# Patient Record
Sex: Male | Born: 1985 | Race: White | Hispanic: No | Marital: Single | Smoking: Current every day smoker
Health system: Southern US, Community
[De-identification: ages and names within clinical notes are randomized; demographics above are authoritative.]

## PROBLEM LIST (undated history)

## (undated) DIAGNOSIS — F32A Depression, unspecified: Secondary | ICD-10-CM

## (undated) DIAGNOSIS — F1010 Alcohol abuse, uncomplicated: Secondary | ICD-10-CM

## (undated) DIAGNOSIS — F419 Anxiety disorder, unspecified: Secondary | ICD-10-CM

## (undated) DIAGNOSIS — F329 Major depressive disorder, single episode, unspecified: Secondary | ICD-10-CM

## (undated) DIAGNOSIS — A549 Gonococcal infection, unspecified: Secondary | ICD-10-CM

## (undated) DIAGNOSIS — K219 Gastro-esophageal reflux disease without esophagitis: Secondary | ICD-10-CM

---
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
## 2006-05-01 ENCOUNTER — Emergency Department (HOSPITAL_COMMUNITY): Admission: EM | Admit: 2006-05-01 | Discharge: 2006-05-01 | Payer: Self-pay | Admitting: Emergency Medicine

## 2006-07-24 ENCOUNTER — Emergency Department (HOSPITAL_COMMUNITY): Admission: EM | Admit: 2006-07-24 | Discharge: 2006-07-24 | Payer: Self-pay | Admitting: Emergency Medicine

## 2010-02-08 ENCOUNTER — Emergency Department (HOSPITAL_COMMUNITY)
Admission: EM | Admit: 2010-02-08 | Discharge: 2010-02-08 | Payer: Self-pay | Source: Home / Self Care | Admitting: Family Medicine

## 2010-05-18 ENCOUNTER — Emergency Department (HOSPITAL_COMMUNITY): Admission: EM | Admit: 2010-05-18 | Discharge: 2010-05-18 | Payer: Self-pay | Admitting: Emergency Medicine

## 2010-05-20 ENCOUNTER — Emergency Department (HOSPITAL_COMMUNITY): Admission: EM | Admit: 2010-05-20 | Discharge: 2010-05-20 | Payer: Self-pay | Admitting: Emergency Medicine

## 2010-10-05 ENCOUNTER — Emergency Department (HOSPITAL_COMMUNITY): Admission: EM | Admit: 2010-10-05 | Discharge: 2010-10-05 | Payer: Self-pay | Admitting: Emergency Medicine

## 2010-10-08 ENCOUNTER — Emergency Department (HOSPITAL_COMMUNITY): Admission: EM | Admit: 2010-10-08 | Discharge: 2010-10-08 | Payer: Self-pay | Admitting: Emergency Medicine

## 2010-10-08 ENCOUNTER — Emergency Department (HOSPITAL_COMMUNITY)
Admission: EM | Admit: 2010-10-08 | Discharge: 2010-10-08 | Payer: Self-pay | Source: Home / Self Care | Admitting: Family Medicine

## 2010-10-12 ENCOUNTER — Emergency Department (HOSPITAL_COMMUNITY): Admission: EM | Admit: 2010-10-12 | Discharge: 2010-10-12 | Payer: Self-pay | Admitting: Emergency Medicine

## 2011-02-23 LAB — POCT I-STAT, CHEM 8
BUN: 9 mg/dL (ref 6–23)
Calcium, Ion: 1.13 mmol/L (ref 1.12–1.32)
HCT: 52 % (ref 39.0–52.0)
Hemoglobin: 17.7 g/dL — ABNORMAL HIGH (ref 13.0–17.0)
Sodium: 137 mEq/L (ref 135–145)
TCO2: 26 mmol/L (ref 0–100)

## 2011-02-28 LAB — POCT URINALYSIS DIP (DEVICE)
Bilirubin Urine: NEGATIVE
Glucose, UA: NEGATIVE mg/dL
Hgb urine dipstick: NEGATIVE
Specific Gravity, Urine: 1.025 (ref 1.005–1.030)
pH: 6.5 (ref 5.0–8.0)

## 2011-02-28 LAB — COMPREHENSIVE METABOLIC PANEL
ALT: 20 U/L (ref 0–53)
Albumin: 5.4 g/dL — ABNORMAL HIGH (ref 3.5–5.2)
Alkaline Phosphatase: 40 U/L (ref 39–117)
Calcium: 9.7 mg/dL (ref 8.4–10.5)
Glucose, Bld: 197 mg/dL — ABNORMAL HIGH (ref 70–99)
Potassium: 3.8 mEq/L (ref 3.5–5.1)
Sodium: 138 mEq/L (ref 135–145)
Total Protein: 7.7 g/dL (ref 6.0–8.3)

## 2011-02-28 LAB — DIFFERENTIAL
Basophils Relative: 0 % (ref 0–1)
Lymphs Abs: 2.3 10*3/uL (ref 0.7–4.0)
Monocytes Absolute: 0.6 10*3/uL (ref 0.1–1.0)
Monocytes Relative: 7 % (ref 3–12)
Neutro Abs: 5.6 10*3/uL (ref 1.7–7.7)
Neutrophils Relative %: 66 % (ref 43–77)

## 2011-02-28 LAB — CBC
HCT: 44.1 % (ref 39.0–52.0)
Platelets: 225 10*3/uL (ref 150–400)
RDW: 12.1 % (ref 11.5–15.5)
WBC: 8.5 10*3/uL (ref 4.0–10.5)

## 2011-02-28 LAB — URINALYSIS, ROUTINE W REFLEX MICROSCOPIC
Ketones, ur: >80 mg/dL — AB
Nitrite: NEGATIVE
Protein, ur: NEGATIVE mg/dL
Urobilinogen, UA: 0.2 mg/dL (ref 0.0–1.0)

## 2011-02-28 LAB — POCT I-STAT, CHEM 8
BUN: 8 mg/dL (ref 6–23)
Calcium, Ion: 1.04 mmol/L — ABNORMAL LOW (ref 1.12–1.32)
Chloride: 107 mEq/L (ref 96–112)
Creatinine, Ser: 0.8 mg/dL (ref 0.4–1.5)
Glucose, Bld: 93 mg/dL (ref 70–99)
Potassium: 3.7 mEq/L (ref 3.5–5.1)

## 2011-02-28 LAB — CK: Total CK: 171 U/L (ref 7–232)

## 2011-03-03 LAB — POCT URINALYSIS DIP (DEVICE)
Hgb urine dipstick: NEGATIVE
Ketones, ur: NEGATIVE mg/dL
Protein, ur: NEGATIVE mg/dL
Specific Gravity, Urine: 1.015 (ref 1.005–1.030)
Urobilinogen, UA: 0.2 mg/dL (ref 0.0–1.0)
pH: 7 (ref 5.0–8.0)

## 2012-04-25 ENCOUNTER — Encounter (HOSPITAL_COMMUNITY): Payer: Self-pay

## 2012-04-25 ENCOUNTER — Other Ambulatory Visit: Payer: Self-pay

## 2012-04-25 ENCOUNTER — Emergency Department (HOSPITAL_COMMUNITY)
Admission: EM | Admit: 2012-04-25 | Discharge: 2012-04-26 | Disposition: A | Discharge status: Home / Self Care | Payer: Self-pay | Attending: Emergency Medicine | Admitting: Emergency Medicine

## 2012-04-25 ENCOUNTER — Emergency Department (HOSPITAL_COMMUNITY): Payer: Self-pay

## 2012-04-25 DIAGNOSIS — R079 Chest pain, unspecified: Secondary | ICD-10-CM | POA: Insufficient documentation

## 2012-04-25 NOTE — ED Notes (Signed)
Pt states that he had acute chest pain tonight that he described as pressure in the left side of his chest, he also states that his hands have been tingling all day. No cocaine use but did smoke a small amount of marijuana this afternoon. He also complains of chills and dizziness

## 2012-04-26 MED ORDER — GI COCKTAIL ~~LOC~~
30.0000 mL | Freq: Once | ORAL | Status: AC
  Administered 2012-04-26: 30 mL via ORAL
  Filled 2012-04-26: qty 30

## 2012-04-26 MED ORDER — OMEPRAZOLE 20 MG PO CPDR: 20.0000 mg | DELAYED_RELEASE_CAPSULE | Freq: Every day | ORAL | Status: DC

## 2012-04-26 NOTE — Discharge Instructions (Signed)
Chest Pain (Nonspecific) It is often hard to give a specific diagnosis for the cause of chest pain. There is always a chance that your pain could be related to something serious, such as a heart attack or a blood clot in the lungs. You need to follow up with your caregiver for further evaluation. CAUSES   Heartburn.   Pneumonia or bronchitis.   Anxiety or stress.   Inflammation around your heart (pericarditis) or lung (pleuritis or pleurisy).   A blood clot in the lung.   A collapsed lung (pneumothorax). It can develop suddenly on its own (spontaneous pneumothorax) or from injury (trauma) to the chest.   Shingles infection (herpes zoster virus).  The chest wall is composed of bones, muscles, and cartilage. Any of these can be the source of the pain.  The bones can be bruised by injury.   The muscles or cartilage can be strained by coughing or overwork.   The cartilage can be affected by inflammation and become sore (costochondritis).  DIAGNOSIS  Lab tests or other studies, such as X-rays, electrocardiography, stress testing, or cardiac imaging, may be needed to find the cause of your pain.  TREATMENT   Treatment depends on what may be causing your chest pain. Treatment may include:   Acid blockers for heartburn.   Anti-inflammatory medicine.   Pain medicine for inflammatory conditions.   Antibiotics if an infection is present.   You may be advised to change lifestyle habits. This includes stopping smoking and avoiding alcohol, caffeine, and chocolate.   You may be advised to keep your head raised (elevated) when sleeping. This reduces the chance of acid going backward from your stomach into your esophagus.   Most of the time, nonspecific chest pain will improve within 2 to 3 days with rest and mild pain medicine.  HOME CARE INSTRUCTIONS   If antibiotics were prescribed, take your antibiotics as directed. Finish them even if you start to feel better.   For the next few  days, avoid physical activities that bring on chest pain. Continue physical activities as directed.   Do not smoke.   Avoid drinking alcohol.   Only take over-the-counter or prescription medicine for pain, discomfort, or fever as directed by your caregiver.   Follow your caregiver's suggestions for further testing if your chest pain does not go away.   Keep any follow-up appointments you made. If you do not go to an appointment, you could develop lasting (chronic) problems with pain. If there is any problem keeping an appointment, you must call to reschedule.  SEEK MEDICAL CARE IF:   You think you are having problems from the medicine you are taking. Read your medicine instructions carefully.   Your chest pain does not go away, even after treatment.   You develop a rash with blisters on your chest.  SEEK IMMEDIATE MEDICAL CARE IF:   You have increased chest pain or pain that spreads to your arm, neck, jaw, back, or abdomen.   You develop shortness of breath, an increasing cough, or you are coughing up blood.   You have severe back or abdominal pain, feel nauseous, or vomit.   You develop severe weakness, fainting, or chills.   You have a fever.  THIS IS AN EMERGENCY. Do not wait to see if the pain will go away. Get medical help at once. Call your local emergency services (911 in [place] Do not drive yourself to the hospital. MAKE SURE YOU:   Understand these instructions.     Will watch your condition.   Will get help right away if you are not doing well or get worse.  Document Released: 09/07/2005 Document Revised: 11/17/2011 Document Reviewed: 07/03/2008 [employer] Patient Information 2012 [employer]. 
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
## 2012-05-21 ENCOUNTER — Emergency Department (HOSPITAL_COMMUNITY): Payer: Self-pay

## 2012-05-21 ENCOUNTER — Encounter (HOSPITAL_COMMUNITY): Payer: Self-pay | Admitting: *Deleted

## 2012-05-21 ENCOUNTER — Emergency Department (HOSPITAL_COMMUNITY)
Admission: EM | Admit: 2012-05-21 | Discharge: 2012-05-21 | Disposition: A | Discharge status: Home / Self Care | Payer: Self-pay | Attending: Emergency Medicine | Admitting: Emergency Medicine

## 2012-05-21 DIAGNOSIS — S5002XA Contusion of left elbow, initial encounter: Secondary | ICD-10-CM

## 2012-05-21 DIAGNOSIS — S5000XA Contusion of unspecified elbow, initial encounter: Secondary | ICD-10-CM | POA: Insufficient documentation

## 2012-05-21 DIAGNOSIS — IMO0002 Reserved for concepts with insufficient information to code with codable children: Secondary | ICD-10-CM | POA: Insufficient documentation

## 2012-05-21 DIAGNOSIS — Y92009 Unspecified place in unspecified non-institutional (private) residence as the place of occurrence of the external cause: Secondary | ICD-10-CM | POA: Insufficient documentation

## 2012-05-21 DIAGNOSIS — T07XXXA Unspecified multiple injuries, initial encounter: Secondary | ICD-10-CM

## 2012-05-21 MED ORDER — HYDROCODONE-ACETAMINOPHEN 5-325 MG PO TABS: 1.0000 | ORAL_TABLET | ORAL | Status: AC | PRN
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
## 2012-05-21 NOTE — Discharge Instructions (Signed)
Abrasions Abrasions are skin scrapes. Their treatment depends on how large and deep the abrasion is. Abrasions do not extend through all layers of the skin. A cut or lesion through all skin layers is called a laceration. HOME CARE INSTRUCTIONS   If you were given a dressing, change it at least once a day or as instructed by your caregiver. If the bandage sticks, soak it off with a solution of water or hydrogen peroxide.   Twice a day, wash the area with soap and water to remove all the cream/ointment. You may do this in a sink, under a tub faucet, or in a shower. Rinse off the soap and pat dry with a clean towel. Look for signs of infection (see below).   Reapply cream/ointment according to your caregiver's instruction. This will help prevent infection and keep the bandage from sticking. Telfa or gauze over the wound and under the dressing or wrap will also help keep the bandage from sticking.   If the bandage becomes wet, dirty, or develops a foul smell, change it as soon as possible.   Only take over-the-counter or prescription medicines for pain, discomfort, or fever as directed by your caregiver.  SEEK IMMEDIATE MEDICAL CARE IF:   Increasing pain in the wound.   Signs of infection develop: redness, swelling, surrounding area is tender to touch, or pus coming from the wound.   You have a fever.   Any foul smell coming from the wound or dressing.  Most skin wounds heal within ten days. Facial wounds heal faster. However, an infection may occur despite proper treatment. You should have the wound checked for signs of infection within 24 to 48 hours or sooner if problems arise. If you were not given a wound-check appointment, look closely at the wound yourself on the second day for early signs of infection listed above. MAKE SURE YOU:   Understand these instructions.   Will watch your condition.   Will get help right away if you are not doing well or get worse.  Document Released:  09/07/2005 Document Revised: 11/17/2011 Document Reviewed: 11/01/2011 [employer] Patient Information 2012 [employer].Contusion A contusion is a deep bruise. Contusions are the result of an injury that caused bleeding under the skin. The contusion may turn blue, purple, or yellow. Minor injuries will give you a painless contusion, but more severe contusions may stay painful and swollen for a few weeks.  CAUSES  A contusion is usually caused by a blow, trauma, or direct force to an area of the body. SYMPTOMS   Swelling and redness of the injured area.   Bruising of the injured area.   Tenderness and soreness of the injured area.   Pain.  DIAGNOSIS  The diagnosis can be made by taking a history and physical exam. An X-ray, CT scan, or MRI may be needed to determine if there were any associated injuries, such as fractures. TREATMENT  Specific treatment will depend on what area of the body was injured. In general, the best treatment for a contusion is resting, icing, elevating, and applying cold compresses to the injured area. Over-the-counter medicines may also be recommended for pain control. Ask your caregiver what the best treatment is for your contusion. HOME CARE INSTRUCTIONS   Put ice on the injured area.   Put ice in a plastic bag.   Place a towel between your skin and the bag.   Leave the ice on for 15 to 20 minutes, 3 to 4 times a day.     Only take over-the-counter or prescription medicines for pain, discomfort, or fever as directed by your caregiver. Your caregiver may recommend avoiding anti-inflammatory medicines (aspirin, ibuprofen, and naproxen) for 48 hours because these medicines may increase bruising.   Rest the injured area.   If possible, elevate the injured area to reduce swelling.  SEEK IMMEDIATE MEDICAL CARE IF:   You have increased bruising or swelling.   You have pain that is getting worse.   Your swelling or pain is not relieved with medicines.  MAKE  SURE YOU:   Understand these instructions.   Will watch your condition.   Will get help right away if you are not doing well or get worse.  Document Released: 09/07/2005 Document Revised: 11/17/2011 Document Reviewed: 10/03/2011 [employer] Patient Information 2012 [employer]. 

## 2012-05-21 NOTE — ED Notes (Signed)
Patient transported to X-ray 

## 2012-05-22 NOTE — ED Provider Notes (Signed)
Medical screening examination/treatment/procedure(s) were performed by non-physician practitioner and as supervising physician I was immediately available for consultation/collaboration.   Dione Booze, MD 05/22/12 [phone]

## 2012-06-21 ENCOUNTER — Emergency Department (HOSPITAL_COMMUNITY)
Admission: EM | Admit: 2012-06-21 | Discharge: 2012-06-22 | Disposition: A | Discharge status: Home / Self Care | Payer: Self-pay | Attending: Emergency Medicine | Admitting: Emergency Medicine

## 2012-06-21 ENCOUNTER — Encounter (HOSPITAL_COMMUNITY): Payer: Self-pay | Admitting: Emergency Medicine

## 2012-06-21 DIAGNOSIS — R197 Diarrhea, unspecified: Secondary | ICD-10-CM | POA: Insufficient documentation

## 2012-06-21 DIAGNOSIS — K2970 Gastritis, unspecified, without bleeding: Secondary | ICD-10-CM | POA: Insufficient documentation

## 2012-06-21 DIAGNOSIS — F411 Generalized anxiety disorder: Secondary | ICD-10-CM | POA: Insufficient documentation

## 2012-06-21 DIAGNOSIS — K2990 Gastroduodenitis, unspecified, without bleeding: Secondary | ICD-10-CM | POA: Insufficient documentation

## 2012-06-21 DIAGNOSIS — R112 Nausea with vomiting, unspecified: Secondary | ICD-10-CM | POA: Insufficient documentation

## 2012-06-21 DIAGNOSIS — R630 Anorexia: Secondary | ICD-10-CM | POA: Insufficient documentation

## 2012-06-21 DIAGNOSIS — F419 Anxiety disorder, unspecified: Secondary | ICD-10-CM

## 2012-06-21 DIAGNOSIS — R109 Unspecified abdominal pain: Secondary | ICD-10-CM | POA: Insufficient documentation

## 2012-06-21 DIAGNOSIS — F17200 Nicotine dependence, unspecified, uncomplicated: Secondary | ICD-10-CM | POA: Insufficient documentation

## 2012-06-21 DIAGNOSIS — Z79899 Other long term (current) drug therapy: Secondary | ICD-10-CM | POA: Insufficient documentation

## 2012-06-21 LAB — COMPREHENSIVE METABOLIC PANEL
BUN: 12 mg/dL (ref 6–23)
CO2: 23 mEq/L (ref 19–32)
Chloride: 101 mEq/L (ref 96–112)
Creatinine, Ser: 0.75 mg/dL (ref 0.50–1.35)
GFR calc non Af Amer: >90 mL/min (ref >90)
Total Bilirubin: 0.7 mg/dL (ref 0.3–1.2)

## 2012-06-21 LAB — CBC WITH DIFFERENTIAL/PLATELET
Basophils Relative: 0 % (ref 0–1)
Eosinophils Relative: 1 % (ref 0–5)
HCT: 45.4 % (ref 39.0–52.0)
Hemoglobin: 16.1 g/dL (ref 13.0–17.0)
MCHC: 35.5 g/dL (ref 30.0–36.0)
MCV: 95 fL (ref 78.0–100.0)
Monocytes Absolute: 0.7 10*3/uL (ref 0.1–1.0)
Monocytes Relative: 8 % (ref 3–12)
Neutro Abs: 5.4 10*3/uL (ref 1.7–7.7)

## 2012-06-21 LAB — URINALYSIS, ROUTINE W REFLEX MICROSCOPIC
Bilirubin Urine: NEGATIVE
Glucose, UA: NEGATIVE mg/dL
Hgb urine dipstick: NEGATIVE
Ketones, ur: 15 mg/dL — AB
pH: 6 (ref 5.0–8.0)

## 2012-06-21 LAB — POCT I-STAT, CHEM 8
HCT: 50 % (ref 39.0–52.0)
Hemoglobin: 17 g/dL (ref 13.0–17.0)
Potassium: 3.9 mEq/L (ref 3.5–5.1)
Sodium: 139 mEq/L (ref 135–145)

## 2012-06-21 LAB — URINE MICROSCOPIC-ADD ON

## 2012-06-21 MED ORDER — LORAZEPAM 1 MG PO TABS
2.0000 mg | ORAL_TABLET | Freq: Once | ORAL | Status: AC
  Administered 2012-06-21: 2 mg via ORAL
  Filled 2012-06-21: qty 2

## 2012-06-21 MED ORDER — MORPHINE SULFATE 4 MG/ML IJ SOLN
4.0000 mg | Freq: Once | INTRAMUSCULAR | Status: AC
  Administered 2012-06-22: 4 mg via INTRAVENOUS
  Filled 2012-06-21: qty 1

## 2012-06-21 MED ORDER — ONDANSETRON HCL 4 MG/2ML IJ SOLN
4.0000 mg | Freq: Once | INTRAMUSCULAR | Status: AC
  Administered 2012-06-22: 4 mg via INTRAVENOUS
  Filled 2012-06-21: qty 2

## 2012-06-21 NOTE — ED Notes (Signed)
Patient presents with c/o lower abd pain that has been going on off and on for several weeks.  States he drinks about 22 ounce of beer daily, when he drinks liquor he hurts in his upper abd area also.  States he has tingling in his hands and legs, hands shaky,  States he usually smokes marijuaina daily but has not had any for the past 2 days.  Constant nausea but no vomiting.

## 2012-06-21 NOTE — ED Provider Notes (Addendum)
History     CSN: [id number]  Arrival date & time 06/21/12  2123   First MD Initiated Contact with Patient 06/21/12 2307      Chief Complaint  Patient presents with  . Abdominal Pain    (Consider location/radiation/quality/duration/timing/severity/associated sxs/prior treatment) Patient is a 26 y.o. male presenting with abdominal pain. The history is provided by the patient.  Abdominal Pain The primary symptoms of the illness include abdominal pain, nausea and vomiting. The primary symptoms of the illness do not include fever, shortness of breath, diarrhea or dysuria. The current episode started 2 days ago. The onset of the illness was gradual. The problem has been gradually worsening.  The pain came on gradually. The abdominal pain has been gradually worsening since its onset. The abdominal pain is located in the epigastric region and periumbilical region. The abdominal pain does not radiate. The severity of the abdominal pain is 5/10. The abdominal pain is relieved by nothing. The abdominal pain is exacerbated by vomiting and eating.  The illness is associated with eating (pt does use alcohol regularly and marijuana and recently stopped in the last few days). The patient has not had a change in bowel habit. Additional symptoms associated with the illness include anorexia. Symptoms associated with the illness do not include chills, constipation, urgency, frequency or back pain. Significant associated medical issues include GERD.    History reviewed. No pertinent past medical history.  History reviewed. No pertinent past surgical history.  History reviewed. No pertinent family history.  History  Substance Use Topics  . Smoking status: Current Everyday Smoker  . Smokeless tobacco: Not on file  . Alcohol Use: Yes      Review of Systems  Constitutional: Negative for fever and chills.  Respiratory: Negative for cough and shortness of breath.   Cardiovascular: Negative for chest  pain.  Gastrointestinal: Positive for nausea, vomiting, abdominal pain and anorexia. Negative for diarrhea and constipation.  Genitourinary: Negative for dysuria, urgency and frequency.  Musculoskeletal: Negative for back pain.  All other systems reviewed and are negative.    Allergies  Review of patient's allergies indicates no known allergies.  Home Medications   Current Outpatient Rx  Name Route Sig Dispense Refill  . OMEPRAZOLE 20 MG PO CPDR Oral Take 20 mg by mouth daily.    Marland Kitchen RANITIDINE HCL 150 MG PO TABS Oral Take 150 mg by mouth daily.      BP 144/94  Temp 98.5 F (36.9 C) (Oral)  Resp 18  SpO2 98%  Physical Exam  Nursing note and vitals reviewed. Constitutional: He is oriented to person, place, and time. He appears well-developed and well-nourished. No distress.  HENT:  Head: Normocephalic and atraumatic.  Mouth/Throat: Oropharynx is clear and moist.  Eyes: Conjunctivae and EOM are normal. Pupils are equal, round, and reactive to light.  Neck: Normal range of motion. Neck supple.  Cardiovascular: Normal rate, regular rhythm, normal heart sounds and intact distal pulses.   No murmur heard. Pulmonary/Chest: Effort normal and breath sounds normal. No respiratory distress. He has no wheezes. He has no rales.  Abdominal: Soft. Normal appearance. He exhibits no distension. There is tenderness in the epigastric area and periumbilical area. There is guarding. There is no rebound, no CVA tenderness, no tenderness at McBurney's point and negative Murphy's sign.    Musculoskeletal: Normal range of motion. He exhibits no edema and no tenderness.  Neurological: He is alert and oriented to person, place, and time.  Skin: Skin is warm and  dry. No rash noted. No erythema.  Psychiatric: He has a normal mood and affect. His behavior is normal.    ED Course  Procedures (including critical care time)  Labs Reviewed  URINALYSIS, ROUTINE W REFLEX MICROSCOPIC - Abnormal; Notable  for the following:    APPearance CLOUDY (*)     Ketones, ur 15 (*)     Leukocytes, UA TRACE (*)     All other components within normal limits  COMPREHENSIVE METABOLIC PANEL - Abnormal; Notable for the following:    Glucose, Bld 112 (*)     Alkaline Phosphatase 37 (*)     All other components within normal limits  POCT I-STAT, CHEM 8 - Abnormal; Notable for the following:    Glucose, Bld 114 (*)     All other components within normal limits  CBC WITH DIFFERENTIAL  URINE MICROSCOPIC-ADD ON  LIPASE, BLOOD   Ct Abdomen Pelvis W Contrast  06/22/2012  *RADIOLOGY REPORT*  Clinical Data: Periumbilical abdominal pain, nausea and diarrhea.  CT ABDOMEN AND PELVIS WITH CONTRAST  Technique:  Multidetector CT imaging of the abdomen and pelvis was performed following the standard protocol during bolus administration of intravenous contrast.  Contrast: 80mL OMNIPAQUE IOHEXOL 300 MG/ML  SOLN  Comparison: None.  Findings: The visualized lung bases are clear.  The liver is unremarkable in appearance.  A 1.2 cm hypodensity along the anterior aspect of the spleen is nonspecific but may reflect a small cyst.  The gallbladder is within normal limits. The pancreas and adrenal glands are unremarkable.  The kidneys are unremarkable in appearance.  There is no evidence of hydronephrosis.  No renal or ureteral stones are seen.  No perinephric stranding is appreciated.  No free fluid is identified.  The small bowel is unremarkable in appearance.  The stomach is within normal limits.  No acute vascular abnormalities are seen.  The appendix is normal in caliber, without evidence for appendicitis.  The colon is unremarkable in appearance.  The bladder is mildly distended and grossly unremarkable in appearance.  The prostate remains normal in size.  No inguinal lymphadenopathy is seen.  No acute osseous abnormalities are identified.  IMPRESSION:  1.  No acute abnormality seen within the abdomen or pelvis. 2.  Nonspecific 1.2 cm  hypodensity along the anterior aspect of the spleen may reflect a small cyst.  Original Report Authenticated By: Tonia Ghent, M.D.     No diagnosis found.    MDM   Patient with periumbilical abdominal pain which has not improved after taking Zantac for a week. The pain is worse at times with eating and his had intermittent emesis. Patient has no right upper quadrant pain however he does drink regularly and states that he recently has stopped as well as stopping marijuana. He's complaining of feeling shaky and tingling in his arms to make him get up and move around. Patient is a bit anxious on exam and feel that the symptoms are due to withdraw alcohol and drugs. Patient does have a pulsatile mass in the center of his abdomen however he's thin man and feel that it is just normal abdominal aorta. His ultrasound at bedside is within normal limits. Due to the persistent pain we'll get a CT to evaluate. Patient given Ativan and morphine.  Labs are wnl.  Lipase pending.  CT pending.   2:53 AM CT is neg.  Most likely all gastritis and related to substance withdrawal.  2:57 AM On re-eval pt feels much better.  Will d/c  home with ativan and pt to start prilosec.    Gwyneth Sprout, MD 06/22/12 0865  Gwyneth Sprout, MD 06/22/12 [id number]

## 2012-06-21 NOTE — ED Notes (Signed)
Patient with abdominal pain, numbness in his hands.  Patient states he was seen a few days ago, taking zantac with no relief.  Patient has lost appetite.-

## 2012-06-22 ENCOUNTER — Encounter (HOSPITAL_COMMUNITY): Payer: Self-pay | Admitting: Radiology

## 2012-06-22 ENCOUNTER — Emergency Department (HOSPITAL_COMMUNITY): Payer: Self-pay

## 2012-06-22 LAB — LIPASE, BLOOD: Lipase: 24 U/L (ref 11–59)

## 2012-06-22 MED ORDER — LORAZEPAM 1 MG PO TABS: 1.0000 mg | ORAL_TABLET | Freq: Three times a day (TID) | ORAL | Status: AC | PRN

## 2012-06-22 MED ORDER — IOHEXOL 300 MG/ML  SOLN
80.0000 mL | Freq: Once | INTRAMUSCULAR | Status: AC | PRN
  Administered 2012-06-22: 80 mL via INTRAVENOUS

## 2012-06-22 MED ORDER — IOHEXOL 300 MG/ML  SOLN: 20.0000 mL | INTRAMUSCULAR | Status: AC

## 2012-06-22 NOTE — ED Notes (Signed)
Understands the need to take the Ativan when he begins to shake,  Continues to take the Nexium as prescribed.

## 2012-07-13 ENCOUNTER — Encounter (HOSPITAL_COMMUNITY): Payer: Self-pay | Admitting: Emergency Medicine

## 2012-07-13 DIAGNOSIS — R109 Unspecified abdominal pain: Secondary | ICD-10-CM | POA: Insufficient documentation

## 2012-07-13 DIAGNOSIS — F411 Generalized anxiety disorder: Secondary | ICD-10-CM | POA: Insufficient documentation

## 2012-07-13 DIAGNOSIS — R079 Chest pain, unspecified: Secondary | ICD-10-CM | POA: Insufficient documentation

## 2012-07-13 DIAGNOSIS — F17200 Nicotine dependence, unspecified, uncomplicated: Secondary | ICD-10-CM | POA: Insufficient documentation

## 2012-07-13 DIAGNOSIS — K219 Gastro-esophageal reflux disease without esophagitis: Secondary | ICD-10-CM | POA: Insufficient documentation

## 2012-07-13 LAB — CBC
MCV: 94.6 fL (ref 78.0–100.0)
Platelets: 234 10*3/uL (ref 150–400)
RBC: 4.46 MIL/uL (ref 4.22–5.81)
RDW: 12 % (ref 11.5–15.5)
WBC: 9.1 10*3/uL (ref 4.0–10.5)

## 2012-07-13 LAB — POCT I-STAT TROPONIN I: Troponin i, poc: 0 ng/mL (ref 0.00–0.08)

## 2012-07-13 LAB — BASIC METABOLIC PANEL
CO2: 23 mEq/L (ref 19–32)
Chloride: 104 mEq/L (ref 96–112)
Creatinine, Ser: 0.66 mg/dL (ref 0.50–1.35)
GFR calc Af Amer: >90 mL/min (ref >90)
Potassium: 3.8 mEq/L (ref 3.5–5.1)
Sodium: 138 mEq/L (ref 135–145)

## 2012-07-13 LAB — POCT I-STAT, CHEM 8
BUN: 11 mg/dL (ref 6–23)
Chloride: 105 mEq/L (ref 96–112)
Creatinine, Ser: 0.8 mg/dL (ref 0.50–1.35)
Potassium: 3.9 mEq/L (ref 3.5–5.1)
Sodium: 140 mEq/L (ref 135–145)

## 2012-07-13 NOTE — ED Notes (Signed)
Having CP X1 month- dx with acid reflux-has not gotten relief from meds; called 911 today d/t near syncope and c/o CP; 4 baby asa given; 12 lead shows incomplete R bundle branch block; LAC 20g; 130-140/90-95; HR 70's 100% on 2L

## 2012-07-13 NOTE — ED Notes (Addendum)
Reports having tingling in fingers and legs; passed out that other day, felt like she was going to pass out tonight-feel like heart is pounding; pt a&ox4, NAD, intermittent CP, SOB

## 2012-07-14 ENCOUNTER — Emergency Department (HOSPITAL_COMMUNITY)
Admission: EM | Admit: 2012-07-14 | Discharge: 2012-07-14 | Disposition: A | Discharge status: Home / Self Care | Payer: Self-pay | Attending: Emergency Medicine | Admitting: Emergency Medicine

## 2012-07-14 DIAGNOSIS — R109 Unspecified abdominal pain: Secondary | ICD-10-CM

## 2012-07-14 DIAGNOSIS — F419 Anxiety disorder, unspecified: Secondary | ICD-10-CM

## 2012-07-14 DIAGNOSIS — R079 Chest pain, unspecified: Secondary | ICD-10-CM

## 2012-07-14 HISTORY — DX: Gastro-esophageal reflux disease without esophagitis: K21.9

## 2012-07-14 NOTE — ED Notes (Signed)
IV d'cd from left [facility] without incident.

## 2012-07-14 NOTE — ED Provider Notes (Signed)
History     CSN: [id number]  Arrival date & time 07/13/12  2045   First MD Initiated Contact with Patient 07/14/12 0032      Chief Complaint  Patient presents with  . Chest Pain  . Near Syncope    (Consider location/radiation/quality/duration/timing/severity/associated sxs/prior treatment) HPI Comments: 26 year old male with no medical history who presents with a complaint of chest pain abdominal pain and palpitations. He states that every day for the last month he has had a feeling of anxiety with feeling like he is "high strung". His mother states that she is the same way he needs to take medication for her anxiety. He was seen in the emergency department twice and has been prescribed Ativan which she states helps. 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 This happens intermittently, he goes away with rest and at this time the symptoms are very mild. The paramedics found him with no acute distress but feeling anxious, his vital signs have been normal, they reported that he had possible PVCs per the patient report. There is no evidence of rhythm strips on the patient's chart at this time. He has had CAT scans, MRIs, EKGs and has not found an answer for his symptoms.  Patient is a 26 y.o. male presenting with chest pain. The history is provided by the patient and medical records.  Chest Pain     Past Medical History  Diagnosis Date  . GERD (gastroesophageal reflux disease)     History reviewed. No pertinent past surgical history.  History reviewed. No pertinent family history.  History  Substance Use Topics  . Smoking status: Current Everyday Smoker -- 1.0 packs/day    Types: Cigarettes  . Smokeless tobacco: Not on file  . Alcohol Use: Yes     1 beer per week      Review of Systems  Cardiovascular: Positive for chest pain.  All  other systems reviewed and are negative.    Allergies  Review of patient's allergies indicates no known allergies.  Home Medications   Current Outpatient Rx  Name Route Sig Dispense Refill  . LORAZEPAM 1 MG PO TABS Oral Take 0.5 mg by mouth every 8 (eight) hours. For anxiety    . MELATONIN PO Oral Take 0.5 tablets by mouth at bedtime as needed. For sleep    . RANITIDINE HCL 150 MG PO TABS Oral Take 150 mg by mouth daily as needed. For heartburn      BP 136/78  Pulse 69  Temp 98.1 F (36.7 C) (Oral)  Resp 13  SpO2 100%  Physical Exam  Nursing note and vitals reviewed. Constitutional: He appears well-developed and well-nourished. No distress.  HENT:  Head: Normocephalic and atraumatic.  Mouth/Throat: Oropharynx is clear and moist. No oropharyngeal exudate.  Eyes: Conjunctivae and EOM are normal. Pupils are equal, round, and reactive to light. Right eye exhibits no discharge. Left eye exhibits no discharge. No scleral icterus.  Neck: Normal range of motion. Neck supple. No JVD present. No thyromegaly present.  Cardiovascular: Normal rate, regular rhythm, normal heart sounds and intact distal pulses.  Exam reveals no gallop and no friction rub.   No murmur heard. Pulmonary/Chest: Effort normal and breath sounds normal. No respiratory distress. He has no wheezes. He has no rales.  Abdominal: Soft. Bowel sounds are normal. He exhibits no distension and no mass. There  is no tenderness.  Musculoskeletal: Normal range of motion. He exhibits no edema and no tenderness.  Lymphadenopathy:    He has no cervical adenopathy.  Neurological: He is alert. Coordination normal.  Skin: Skin is warm and dry. No rash noted. No erythema.  Psychiatric: His behavior is normal.       Anxious, mild depression, no suicidal thoughts or hallucinations    ED Course  Procedures (including critical care time)   Labs Reviewed  CBC  BASIC METABOLIC PANEL  POCT I-STAT, CHEM 8  POCT I-STAT TROPONIN I     No results found.   1. Anxiety   2. Chest pain   3. Abdominal pain       MDM   EKG at this time shows incomplete right bundle branch block with right axis deviation but no other signs of abnormalities. His laboratory data is is normal, bili distress his symptoms with his exam is consistent with anxiety and I have referred the patient to a psychiatrist. I've explained these findings to the patient and his family members, who will be discharged in stable condition.  ED ECG REPORT  I personally interpreted this EKG   Date: 07/14/2012   Rate: 76  Rhythm: normal sinus rhythm  QRS Axis: right  Intervals: Prolonged QRS  ST/T Wave abnormalities: normal  Conduction Disutrbances:none  Narrative Interpretation:   Old EKG Reviewed: unchanged from 04/25/2012         Vida Roller, MD 07/14/12 [phone]

## 2012-07-14 NOTE — ED Notes (Signed)
MD at bedside. 

## 2012-07-24 ENCOUNTER — Emergency Department (HOSPITAL_COMMUNITY)
Admission: EM | Admit: 2012-07-24 | Discharge: 2012-07-24 | Disposition: A | Discharge status: Home / Self Care | Payer: Self-pay | Attending: Emergency Medicine | Admitting: Emergency Medicine

## 2012-07-24 DIAGNOSIS — R109 Unspecified abdominal pain: Secondary | ICD-10-CM | POA: Insufficient documentation

## 2012-07-24 DIAGNOSIS — R10816 Epigastric abdominal tenderness: Secondary | ICD-10-CM | POA: Insufficient documentation

## 2012-07-24 DIAGNOSIS — R300 Dysuria: Secondary | ICD-10-CM | POA: Insufficient documentation

## 2012-07-24 DIAGNOSIS — F411 Generalized anxiety disorder: Secondary | ICD-10-CM | POA: Insufficient documentation

## 2012-07-24 DIAGNOSIS — R197 Diarrhea, unspecified: Secondary | ICD-10-CM | POA: Insufficient documentation

## 2012-07-24 LAB — URINALYSIS, ROUTINE W REFLEX MICROSCOPIC
Glucose, UA: NEGATIVE mg/dL
Hgb urine dipstick: NEGATIVE
Ketones, ur: >80 mg/dL — AB
Leukocytes, UA: NEGATIVE
Protein, ur: 30 mg/dL — AB
pH: 6 (ref 5.0–8.0)

## 2012-07-24 LAB — COMPREHENSIVE METABOLIC PANEL
ALT: 28 U/L (ref 0–53)
AST: 44 U/L — ABNORMAL HIGH (ref 0–37)
Albumin: 4.5 g/dL (ref 3.5–5.2)
CO2: 24 mEq/L (ref 19–32)
Chloride: 97 mEq/L (ref 96–112)
Creatinine, Ser: 0.89 mg/dL (ref 0.50–1.35)
GFR calc non Af Amer: >90 mL/min (ref >90)
Sodium: 134 mEq/L — ABNORMAL LOW (ref 135–145)
Total Bilirubin: 0.4 mg/dL (ref 0.3–1.2)

## 2012-07-24 LAB — URINE MICROSCOPIC-ADD ON

## 2012-07-24 MED ORDER — AZITHROMYCIN 250 MG PO TABS
1000.0000 mg | ORAL_TABLET | Freq: Once | ORAL | Status: AC
  Administered 2012-07-24: 1000 mg via ORAL
  Filled 2012-07-24: qty 4

## 2012-07-24 MED ORDER — LORAZEPAM 1 MG PO TABS
1.0000 mg | ORAL_TABLET | Freq: Once | ORAL | Status: AC
  Administered 2012-07-24: 1 mg via ORAL
  Filled 2012-07-24: qty 1

## 2012-07-24 MED ORDER — CEFTRIAXONE SODIUM 250 MG IJ SOLR
250.0000 mg | Freq: Once | INTRAMUSCULAR | Status: AC
  Administered 2012-07-24: 250 mg via INTRAMUSCULAR
  Filled 2012-07-24: qty 250

## 2012-07-24 MED ORDER — PANTOPRAZOLE SODIUM 40 MG PO TBEC: 40.0000 mg | DELAYED_RELEASE_TABLET | Freq: Every day | ORAL | Status: DC

## 2012-07-24 NOTE — ED Notes (Addendum)
Per ems pt significant other reports nausea/vomitting, all over cramps since yesterday from believed ETOH detox. Alert and oriented x4. En route pt received 500 ml NS bolus.  Upon rn assessment. 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 Since then anxiety has increased. Was seen yesterday at [facility] and prescribed zoloft and hydroxazine. Pt reports that last ETOH 2 days ago, reports taking 1 shot of alcohol. Before that pt had not drank any ETOH for 2 weeks. Reports diarrhea, nausea, and vomitting x4. Still diarrhea this am. Last time vomited at 0700.  Reports central abdominal pain 5/10 since yesterday 1600 when diarrhea started.  En route to hospital today pt started having full body cramps and pt had difficulty ambulating, pt "felt like he was going to pass out".   Pt also had court date today at 0900 for DUI, that pt missed.

## 2012-07-24 NOTE — ED Notes (Signed)
Plebotomist at bedside.

## 2012-07-24 NOTE — ED Notes (Signed)
MD at bedside. 

## 2012-07-24 NOTE — ED Notes (Signed)
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

## 2012-07-24 NOTE — ED Provider Notes (Addendum)
History     CSN: [id number]  Arrival date & time 07/24/12  1013   First MD Initiated Contact with Patient 07/24/12 1105      Chief Complaint  Patient presents with  . Abdominal Pain  . Vomiting  . Diarrhea    (Consider location/radiation/quality/duration/timing/severity/associated sxs/prior treatment) Patient is a 26 y.o. male presenting with abdominal pain and diarrhea. The history is provided by the patient.  Abdominal Pain The primary symptoms of the illness include abdominal pain and diarrhea. The primary symptoms of the illness do not include fever or shortness of breath.  Symptoms associated with the illness do not include back pain.  Diarrhea The primary symptoms include abdominal pain and diarrhea. Primary symptoms do not include fever or rash.  The illness does not include back pain.  pt c/o epigastric pain, nv in past day. States 1-2 episodes emesis, clear, not bloody or bilious. Had 1-2 loose bms, not bloody. No abd distension. No prior abd surgery. No fever or chills. No known bad food ingestion or ill contacts. Denies hx pud, gallstones, or pancreatitis. Pain constant, dull, does not radiate. No chest pain or discomfort. No sob.      Past Medical History  Diagnosis Date  . GERD (gastroesophageal reflux disease)     No past surgical history on file.  No family history on file.  History  Substance Use Topics  . Smoking status: Current Everyday Smoker -- 1.0 packs/day    Types: Cigarettes  . Smokeless tobacco: Not on file  . Alcohol Use: Yes     1 beer per week      Review of Systems  Constitutional: Negative for fever.  HENT: Negative for neck pain.   Eyes: Negative for redness.  Respiratory: Negative for cough and shortness of breath.   Cardiovascular: Negative for chest pain.  Gastrointestinal: Positive for abdominal pain and diarrhea.  Genitourinary: Negative for flank pain.  Musculoskeletal: Negative for back pain.  Skin: Negative for rash.    Neurological: Negative for headaches.  Hematological: Does not bruise/bleed easily.  Psychiatric/Behavioral: Negative for confusion.    Allergies  Review of patient's allergies indicates no known allergies.  Home Medications   Current Outpatient Rx  Name Route Sig Dispense Refill  . HYDROXYZINE HCL 25 MG PO TABS Oral Take 25 mg by mouth 3 (three) times daily as needed. anxiety    . RANITIDINE HCL 150 MG PO TABS Oral Take 150 mg by mouth daily as needed. For heartburn      BP 139/81  Pulse 91  Temp 97.9 F (36.6 C) (Oral)  Resp 16  Ht 5\' 8"  (1.727 m)  SpO2 97%  Physical Exam  Nursing note and vitals reviewed. Constitutional: He is oriented to person, place, and time. He appears well-developed and well-nourished. No distress.  HENT:  Head: Atraumatic.  Mouth/Throat: Oropharynx is clear and moist.  Eyes: Conjunctivae are normal.  Neck: Neck supple. No tracheal deviation present.  Cardiovascular: Normal rate, regular rhythm, normal heart sounds and intact distal pulses.   Pulmonary/Chest: Effort normal and breath sounds normal. No accessory muscle usage. No respiratory distress.  Abdominal: Soft. Bowel sounds are normal. He exhibits no distension and no mass. There is no rebound and no guarding.       Mild epigastric tenderness, no rebound or guarding.   Genitourinary:       No cva tenderness. No penile discharge.   Musculoskeletal: Normal range of motion. He exhibits no edema and no tenderness.  Neurological: He  is alert and oriented to person, place, and time.  Skin: Skin is warm and dry.  Psychiatric:       anxious    ED Course  Procedures (including critical care time)   Labs Reviewed  COMPREHENSIVE METABOLIC PANEL  LIPASE, BLOOD    Results for orders placed during the hospital encounter of 07/24/12  COMPREHENSIVE METABOLIC PANEL      Component Value Range   Sodium 134 (*) 135 - 145 mEq/L   Potassium 4.2  3.5 - 5.1 mEq/L   Chloride 97  96 - 112 mEq/L    CO2 24  19 - 32 mEq/L   Glucose, Bld 111 (*) 70 - 99 mg/dL   BUN 18  6 - 23 mg/dL   Creatinine, Ser 0.98  0.50 - 1.35 mg/dL   Calcium 9.2  8.4 - 11.9 mg/dL   Total Protein 7.2  6.0 - 8.3 g/dL   Albumin 4.5  3.5 - 5.2 g/dL   AST 44 (*) 0 - 37 U/L   ALT 28  0 - 53 U/L   Alkaline Phosphatase 41  39 - 117 U/L   Total Bilirubin 0.4  0.3 - 1.2 mg/dL   GFR calc non Af Amer >90  >90 mL/min   GFR calc Af Amer >90  >90 mL/min  LIPASE, BLOOD      Component Value Range   Lipase 20  11 - 59 U/L  URINALYSIS, ROUTINE W REFLEX MICROSCOPIC      Component Value Range   Color, Urine AMBER (*) YELLOW   APPearance CLOUDY (*) CLEAR   Specific Gravity, Urine 1.033 (*) 1.005 - 1.030   pH 6.0  5.0 - 8.0   Glucose, UA NEGATIVE  NEGATIVE mg/dL   Hgb urine dipstick NEGATIVE  NEGATIVE   Bilirubin Urine SMALL (*) NEGATIVE   Ketones, ur >80 (*) NEGATIVE mg/dL   Protein, ur 30 (*) NEGATIVE mg/dL   Urobilinogen, UA 0.2  0.0 - 1.0 mg/dL   Nitrite NEGATIVE  NEGATIVE   Leukocytes, UA NEGATIVE  NEGATIVE  URINE MICROSCOPIC-ADD ON      Component Value Range   Squamous Epithelial / LPF FEW (*) RARE   WBC, UA 0-2  <3 WBC/hpf   Bacteria, UA FEW (*) RARE   Casts HYALINE CASTS (*) NEGATIVE   Urine-Other MUCOUS PRESENT         MDM  Reviewed recent charts, recent ed evals for same, along w anxiety.  Recent ct abd/pelvic neg for acute process.  abd soft nt.  Pt anxious. Ativan 1 mg po.   Pt now notes scant penile d/c, sl dysuria. ua sent. Will cx. Gc/chlam also sent. Rocephin im. zithromax po.   Recheck abd soft nt. Tolerating po fluids.   Given recurrent upper abd pain, will make referral to f/u gi.       Suzi Roots, MD 07/24/12 1421  Suzi Roots, MD 07/24/12 [phone]

## 2012-07-25 LAB — URINE CULTURE
Colony Count: NO GROWTH
Culture: NO GROWTH

## 2012-10-13 ENCOUNTER — Encounter (HOSPITAL_COMMUNITY): Payer: Self-pay | Admitting: Emergency Medicine

## 2012-10-13 ENCOUNTER — Emergency Department (HOSPITAL_COMMUNITY)
Admission: EM | Admit: 2012-10-13 | Discharge: 2012-10-14 | Disposition: A | Discharge status: Home / Self Care | Payer: Self-pay | Attending: Emergency Medicine | Admitting: Emergency Medicine

## 2012-10-13 DIAGNOSIS — S51809A Unspecified open wound of unspecified forearm, initial encounter: Secondary | ICD-10-CM | POA: Insufficient documentation

## 2012-10-13 DIAGNOSIS — R451 Restlessness and agitation: Secondary | ICD-10-CM

## 2012-10-13 DIAGNOSIS — IMO0002 Reserved for concepts with insufficient information to code with codable children: Secondary | ICD-10-CM

## 2012-10-13 DIAGNOSIS — F10929 Alcohol use, unspecified with intoxication, unspecified: Secondary | ICD-10-CM

## 2012-10-13 DIAGNOSIS — F3289 Other specified depressive episodes: Secondary | ICD-10-CM | POA: Insufficient documentation

## 2012-10-13 DIAGNOSIS — F329 Major depressive disorder, single episode, unspecified: Secondary | ICD-10-CM | POA: Insufficient documentation

## 2012-10-13 DIAGNOSIS — F17200 Nicotine dependence, unspecified, uncomplicated: Secondary | ICD-10-CM | POA: Insufficient documentation

## 2012-10-13 DIAGNOSIS — X789XXA Intentional self-harm by unspecified sharp object, initial encounter: Secondary | ICD-10-CM | POA: Insufficient documentation

## 2012-10-13 HISTORY — DX: Major depressive disorder, single episode, unspecified: F32.9

## 2012-10-13 HISTORY — DX: Depression, unspecified: F32.A

## 2012-10-13 LAB — COMPREHENSIVE METABOLIC PANEL
ALT: 15 U/L (ref 0–53)
AST: 26 U/L (ref 0–37)
Alkaline Phosphatase: 40 U/L (ref 39–117)
CO2: 26 mEq/L (ref 19–32)
Chloride: 108 mEq/L (ref 96–112)
GFR calc Af Amer: >90 mL/min (ref >90)
GFR calc non Af Amer: >90 mL/min (ref >90)
Glucose, Bld: 107 mg/dL — ABNORMAL HIGH (ref 70–99)
Potassium: 4.4 mEq/L (ref 3.5–5.1)
Sodium: 145 mEq/L (ref 135–145)
Total Bilirubin: 0.2 mg/dL — ABNORMAL LOW (ref 0.3–1.2)

## 2012-10-13 LAB — CBC
Hemoglobin: 16.6 g/dL (ref 13.0–17.0)
Platelets: 282 10*3/uL (ref 150–400)
RBC: 5.01 MIL/uL (ref 4.22–5.81)
WBC: 8.5 10*3/uL (ref 4.0–10.5)

## 2012-10-13 LAB — RAPID URINE DRUG SCREEN, HOSP PERFORMED
Amphetamines: NOT DETECTED
Barbiturates: NOT DETECTED
Benzodiazepines: NOT DETECTED
Cocaine: NOT DETECTED

## 2012-10-13 MED ORDER — LORAZEPAM 1 MG PO TABS: 1.0000 mg | ORAL_TABLET | Freq: Four times a day (QID) | ORAL | Status: DC | PRN

## 2012-10-13 MED ORDER — LORAZEPAM 1 MG PO TABS
0.0000 mg | ORAL_TABLET | Freq: Four times a day (QID) | ORAL | Status: DC
  Administered 2012-10-13 (×2): 1 mg via ORAL
  Filled 2012-10-13: qty 1

## 2012-10-13 MED ORDER — NICOTINE 21 MG/24HR TD PT24
21.0000 mg | MEDICATED_PATCH | Freq: Every day | TRANSDERMAL | Status: DC
  Administered 2012-10-13: 21 mg via TRANSDERMAL
  Filled 2012-10-13: qty 1

## 2012-10-13 MED ORDER — VITAMIN B-1 100 MG PO TABS
100.0000 mg | ORAL_TABLET | Freq: Every day | ORAL | Status: DC
  Administered 2012-10-13: 100 mg via ORAL
  Filled 2012-10-13: qty 1

## 2012-10-13 MED ORDER — ACETAMINOPHEN 325 MG PO TABS
650.0000 mg | ORAL_TABLET | ORAL | Status: DC | PRN
  Administered 2012-10-13: 650 mg via ORAL
  Filled 2012-10-13: qty 2

## 2012-10-13 MED ORDER — FOLIC ACID 1 MG PO TABS
1.0000 mg | ORAL_TABLET | Freq: Every day | ORAL | Status: DC
  Administered 2012-10-13: 1 mg via ORAL
  Filled 2012-10-13: qty 1

## 2012-10-13 MED ORDER — ALUM & MAG HYDROXIDE-SIMETH 200-200-20 MG/5ML PO SUSP
30.0000 mL | ORAL | Status: DC | PRN
  Filled 2012-10-13: qty 30

## 2012-10-13 MED ORDER — ADULT MULTIVITAMIN W/MINERALS CH
1.0000 | ORAL_TABLET | Freq: Every day | ORAL | Status: DC
  Administered 2012-10-13: 1 via ORAL
  Filled 2012-10-13: qty 1

## 2012-10-13 MED ORDER — LORAZEPAM 1 MG PO TABS
1.0000 mg | ORAL_TABLET | Freq: Three times a day (TID) | ORAL | Status: DC | PRN
  Filled 2012-10-13: qty 1

## 2012-10-13 MED ORDER — THIAMINE HCL 100 MG/ML IJ SOLN: 100.0000 mg | Freq: Every day | INTRAMUSCULAR | Status: DC

## 2012-10-13 MED ORDER — LORAZEPAM 1 MG PO TABS: 0.0000 mg | ORAL_TABLET | Freq: Two times a day (BID) | ORAL | Status: DC

## 2012-10-13 MED ORDER — IBUPROFEN 600 MG PO TABS: 600.0000 mg | ORAL_TABLET | Freq: Three times a day (TID) | ORAL | Status: DC | PRN

## 2012-10-13 MED ORDER — LORAZEPAM 2 MG/ML IJ SOLN: 1.0000 mg | Freq: Four times a day (QID) | INTRAMUSCULAR | Status: DC | PRN

## 2012-10-13 NOTE — ED Notes (Signed)
ACT into see 
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
## 2012-10-13 NOTE — ED Notes (Signed)
Pt provided paper scrubs with instruction 
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
## 2013-06-08 ENCOUNTER — Emergency Department (HOSPITAL_COMMUNITY)
Admission: EM | Admit: 2013-06-08 | Discharge: 2013-06-08 | Disposition: A | Discharge status: Home / Self Care | Payer: Self-pay | Attending: Emergency Medicine | Admitting: Emergency Medicine

## 2013-06-08 ENCOUNTER — Emergency Department (HOSPITAL_COMMUNITY): Payer: Self-pay

## 2013-06-08 ENCOUNTER — Other Ambulatory Visit: Payer: Self-pay

## 2013-06-08 ENCOUNTER — Encounter (HOSPITAL_COMMUNITY): Payer: Self-pay | Admitting: Physical Medicine and Rehabilitation

## 2013-06-08 DIAGNOSIS — F329 Major depressive disorder, single episode, unspecified: Secondary | ICD-10-CM | POA: Insufficient documentation

## 2013-06-08 DIAGNOSIS — R0602 Shortness of breath: Secondary | ICD-10-CM | POA: Insufficient documentation

## 2013-06-08 DIAGNOSIS — Z79899 Other long term (current) drug therapy: Secondary | ICD-10-CM | POA: Insufficient documentation

## 2013-06-08 DIAGNOSIS — R079 Chest pain, unspecified: Secondary | ICD-10-CM | POA: Insufficient documentation

## 2013-06-08 DIAGNOSIS — F411 Generalized anxiety disorder: Secondary | ICD-10-CM | POA: Insufficient documentation

## 2013-06-08 DIAGNOSIS — K219 Gastro-esophageal reflux disease without esophagitis: Secondary | ICD-10-CM | POA: Insufficient documentation

## 2013-06-08 DIAGNOSIS — F3289 Other specified depressive episodes: Secondary | ICD-10-CM | POA: Insufficient documentation

## 2013-06-08 DIAGNOSIS — R110 Nausea: Secondary | ICD-10-CM | POA: Insufficient documentation

## 2013-06-08 DIAGNOSIS — F17200 Nicotine dependence, unspecified, uncomplicated: Secondary | ICD-10-CM | POA: Insufficient documentation

## 2013-06-08 DIAGNOSIS — R42 Dizziness and giddiness: Secondary | ICD-10-CM | POA: Insufficient documentation

## 2013-06-08 DIAGNOSIS — R109 Unspecified abdominal pain: Secondary | ICD-10-CM | POA: Insufficient documentation

## 2013-06-08 HISTORY — DX: Anxiety disorder, unspecified: F41.9

## 2013-06-08 LAB — BASIC METABOLIC PANEL
CO2: 27 mEq/L (ref 19–32)
Calcium: 9.1 mg/dL (ref 8.4–10.5)
Chloride: 102 mEq/L (ref 96–112)
Glucose, Bld: 113 mg/dL — ABNORMAL HIGH (ref 70–99)
Sodium: 138 mEq/L (ref 135–145)

## 2013-06-08 LAB — CBC WITH DIFFERENTIAL/PLATELET
Basophils Absolute: 0 10*3/uL (ref 0.0–0.1)
Eosinophils Relative: 2 % (ref 0–5)
HCT: 44.4 % (ref 39.0–52.0)
Lymphocytes Relative: 25 % (ref 12–46)
Lymphs Abs: 2.2 10*3/uL (ref 0.7–4.0)
MCV: 95.1 fL (ref 78.0–100.0)
Monocytes Absolute: 0.7 10*3/uL (ref 0.1–1.0)
Neutro Abs: 5.8 10*3/uL (ref 1.7–7.7)
Platelets: 243 10*3/uL (ref 150–400)
RBC: 4.67 MIL/uL (ref 4.22–5.81)
WBC: 8.7 10*3/uL (ref 4.0–10.5)

## 2013-06-08 LAB — POCT I-STAT TROPONIN I: Troponin i, poc: 0 ng/mL (ref 0.00–0.08)

## 2013-06-08 NOTE — ED Notes (Addendum)
Pt presents to department for evaluation of diffuse chest pain, bilateral arm pain and dizziness. Ongoing x2 days. Denies recent injury. 2/10 chest pain that increases with movement. Pt is alert and oriented x4. Pt states history of anxiety.

## 2013-06-08 NOTE — ED Notes (Signed)
Patient transported to X-ray 

## 2013-06-08 NOTE — ED Provider Notes (Signed)
History    CSN: [id number] Arrival date & time 06/08/13  1627  First MD Initiated Contact with Patient 06/08/13 1828     Chief Complaint  Patient presents with  . Chest Pain  . Dizziness    Patient is a 27 y.o. male presenting with chest pain. The history is provided by the patient.  Chest Pain Pain location:  L chest and R chest Pain quality: pressure   Pain radiates to:  Does not radiate Pain radiates to the back: no   Pain severity:  Moderate Onset quality:  Gradual Timing:  Constant Progression:  Worsening Chronicity:  New Relieved by:  Nothing Worsened by:  Certain positions, movement and exertion Associated symptoms: abdominal pain, anxiety, dizziness, nausea and shortness of breath   Associated symptoms: no back pain, no cough, no diaphoresis, no fever, no near-syncope, no syncope and not vomiting   Risk factors: smoking   Risk factors: no prior DVT/PE   pt reports constant CP for at least 3 days - he reports it has been occurring 24/7 Seems worse with movement and exertion No CP with deep breathing Reports bilateral upper arm pain but no focal weakness and does not feel that pain is radiating from Chest No h/o CAD No h/o PE/DVT Denies drug use He smokes cigarettes He admits to heavy ETOH use recently No recent travel/surgery  Past Medical History  Diagnosis Date  . Anxiety    No past surgical history on file. History reviewed. No pertinent family history. History  Substance Use Topics  . Smoking status: Current Every Day Smoker    Types: Cigarettes  . Smokeless tobacco: Not on file  . Alcohol Use: No    Review of Systems  Constitutional: Negative for fever and diaphoresis.  Respiratory: Positive for shortness of breath. Negative for cough.   Cardiovascular: Positive for chest pain. Negative for syncope and near-syncope.  Gastrointestinal: Positive for nausea and abdominal pain. Negative for vomiting and blood in stool.  Musculoskeletal: Negative  for back pain.  Neurological: Positive for dizziness. Negative for syncope.  Psychiatric/Behavioral: The patient is nervous/anxious.   All other systems reviewed and are negative.    Allergies  Review of patient's allergies indicates no known allergies.  Home Medications   Current Outpatient Rx  Name  Route  Sig  Dispense  Refill  . acetaminophen (TYLENOL) 500 MG tablet   Oral   Take 1,000 mg by mouth daily as needed (for headache).         . clonazePAM (KLONOPIN) 0.5 MG tablet   Oral   Take 0.5 mg by mouth 2 (two) times daily as needed for anxiety.         . ranitidine (ZANTAC) 150 MG tablet   Oral   Take 150 mg by mouth daily as needed (for stomach pain).          BP 121/77  Pulse 100  Temp(Src) 98 F (36.7 C) (Oral)  Resp 20  SpO2 99% Physical Exam CONSTITUTIONAL: Well developed/well nourished HEAD: Normocephalic/atraumatic EYES: EOMI/PERRL ENMT: Mucous membranes moist NECK: supple no meningeal signs SPINE:entire spine nontender CV: S1/S2 noted, no murmurs/rubs/gallops noted Chest-  Left chest wall tender to palpation, no bruising or crepitance noted LUNGS: Lungs are clear to auscultation bilaterally, no apparent distress ABDOMEN: soft, nontender, no rebound or guarding GU:no cva tenderness NEURO: Pt is awake/alert, moves all extremitiesx4 EXTREMITIES: pulses normal, full ROM, no LE swelling and no calf tenderness.   SKIN: warm, color normal PSYCH: no abnormalities  of mood noted  ED Course  Procedures  Labs Reviewed  CBC WITH DIFFERENTIAL - Abnormal; Notable for the following:    MCH 34.5 (*)    MCHC 36.3 (*)    All other components within normal limits  BASIC METABOLIC PANEL - Abnormal; Notable for the following:    Glucose, Bld 113 (*)    All other components within normal limits  POCT I-STAT TROPONIN I   Dg Chest 2 View  06/08/2013   *RADIOLOGY REPORT*  Clinical Data: Chest pain and pressure.  CHEST - 2 VIEW  Comparison: None.  Findings:  Heart size is normal.  Mediastinal shadows are normal allowing for the narrow AP diameter of the chest.  Lungs are clear. The vascularity is normal.  No effusions.  No acute bony findings.  IMPRESSION: Narrow AP diameter of the chest.  Normal appearance allowing for that.   Original Report Authenticated By: Paulina Fusi, M.D.   7:38 PM Pt with CP for over 3 days constant, EKG unchanged from prior and troponin negative, low suspicion for ACS, pain reproducible on exam.  I doubt PE given history/exam.  I doubt aortic dissection  He reports having this pain in past.  His name was entered incorrectly and I was able to check chart from previous visits.  His EKG is unchanged but I did advise f/u with cardiology    MDM  Nursing notes including past medical history and social history reviewed and considered in documentation xrays reviewed and considered Labs/vital reviewed and considered Previous records reviewed and considered     Date: 06/08/2013 1635  Rate: 97  Rhythm: normal sinus rhythm  QRS Axis: right  Intervals: normal  ST/T Wave abnormalities: nonspecific ST changes  Conduction Disutrbances:nonspecific intraventricular conduction delay  Narrative Interpretation:   Old EKG Reviewed: unchanged from 07/2012    Joya Gaskins, MD 06/08/13 1940

## 2013-06-08 NOTE — ED Notes (Signed)
Pt complaining of generalized chest pain that radiates to both arms. Pt stated that CP is intermittent and stared 2 days ago. Pt stated that he also has been having SOB and dizziness with CP. Pt has a smoking history and anxiety history. Currently, pt is alert and oriented. No cardiac or respiratory distress at this time. No neurological deficits at this time. Will continue to monitor.

## 2013-06-10 ENCOUNTER — Encounter (HOSPITAL_COMMUNITY): Payer: Self-pay | Admitting: Emergency Medicine

## 2013-07-01 ENCOUNTER — Encounter (HOSPITAL_COMMUNITY): Payer: Self-pay | Admitting: Emergency Medicine

## 2013-07-01 ENCOUNTER — Emergency Department (HOSPITAL_COMMUNITY)
Admission: EM | Admit: 2013-07-01 | Discharge: 2013-07-02 | Disposition: A | Discharge status: Other Acute Inpatient Hospital | Payer: Self-pay | Attending: Emergency Medicine | Admitting: Emergency Medicine

## 2013-07-01 ENCOUNTER — Emergency Department (HOSPITAL_COMMUNITY): Payer: Self-pay

## 2013-07-01 DIAGNOSIS — F411 Generalized anxiety disorder: Secondary | ICD-10-CM | POA: Insufficient documentation

## 2013-07-01 DIAGNOSIS — F17200 Nicotine dependence, unspecified, uncomplicated: Secondary | ICD-10-CM | POA: Insufficient documentation

## 2013-07-01 DIAGNOSIS — R4589 Other symptoms and signs involving emotional state: Secondary | ICD-10-CM

## 2013-07-01 DIAGNOSIS — Z7289 Other problems related to lifestyle: Secondary | ICD-10-CM

## 2013-07-01 DIAGNOSIS — F489 Nonpsychotic mental disorder, unspecified: Secondary | ICD-10-CM | POA: Insufficient documentation

## 2013-07-01 DIAGNOSIS — F10230 Alcohol dependence with withdrawal, uncomplicated: Secondary | ICD-10-CM

## 2013-07-01 DIAGNOSIS — Z046 Encounter for general psychiatric examination, requested by authority: Secondary | ICD-10-CM | POA: Insufficient documentation

## 2013-07-01 DIAGNOSIS — F1010 Alcohol abuse, uncomplicated: Secondary | ICD-10-CM | POA: Insufficient documentation

## 2013-07-01 DIAGNOSIS — K219 Gastro-esophageal reflux disease without esophagitis: Secondary | ICD-10-CM | POA: Insufficient documentation

## 2013-07-01 DIAGNOSIS — Z79899 Other long term (current) drug therapy: Secondary | ICD-10-CM | POA: Insufficient documentation

## 2013-07-01 DIAGNOSIS — F10239 Alcohol dependence with withdrawal, unspecified: Secondary | ICD-10-CM | POA: Insufficient documentation

## 2013-07-01 DIAGNOSIS — F10939 Alcohol use, unspecified with withdrawal, unspecified: Secondary | ICD-10-CM | POA: Insufficient documentation

## 2013-07-01 DIAGNOSIS — F329 Major depressive disorder, single episode, unspecified: Secondary | ICD-10-CM | POA: Insufficient documentation

## 2013-07-01 DIAGNOSIS — F3289 Other specified depressive episodes: Secondary | ICD-10-CM | POA: Insufficient documentation

## 2013-07-01 LAB — COMPREHENSIVE METABOLIC PANEL
AST: 20 U/L (ref 0–37)
Albumin: 4.4 g/dL (ref 3.5–5.2)
BUN: 13 mg/dL (ref 6–23)
CO2: 24 mEq/L (ref 19–32)
Calcium: 9.7 mg/dL (ref 8.4–10.5)
Chloride: 103 mEq/L (ref 96–112)
Creatinine, Ser: 0.7 mg/dL (ref 0.50–1.35)
GFR calc non Af Amer: >90 mL/min (ref >90)
Total Bilirubin: 0.9 mg/dL (ref 0.3–1.2)

## 2013-07-01 LAB — SALICYLATE LEVEL: Salicylate Lvl: <2 mg/dL — ABNORMAL LOW (ref 2.8–20.0)

## 2013-07-01 LAB — CBC
HCT: 44.7 % (ref 39.0–52.0)
MCHC: 36 g/dL (ref 30.0–36.0)
MCV: 93.5 fL (ref 78.0–100.0)
Platelets: 215 10*3/uL (ref 150–400)
RDW: 12.3 % (ref 11.5–15.5)
WBC: 9.2 10*3/uL (ref 4.0–10.5)

## 2013-07-01 LAB — URINALYSIS, ROUTINE W REFLEX MICROSCOPIC
Hgb urine dipstick: NEGATIVE
Protein, ur: NEGATIVE mg/dL
Specific Gravity, Urine: 1.028 (ref 1.005–1.030)
Urobilinogen, UA: 1 mg/dL (ref 0.0–1.0)

## 2013-07-01 LAB — ACETAMINOPHEN LEVEL: Acetaminophen (Tylenol), Serum: <15 ug/mL (ref 10–30)

## 2013-07-01 LAB — RAPID URINE DRUG SCREEN, HOSP PERFORMED
Cocaine: NOT DETECTED
Opiates: NOT DETECTED

## 2013-07-01 LAB — URINE MICROSCOPIC-ADD ON

## 2013-07-01 MED ORDER — LORAZEPAM 1 MG PO TABS
0.0000 mg | ORAL_TABLET | Freq: Four times a day (QID) | ORAL | Status: DC
  Administered 2013-07-01: 1 mg via ORAL
  Administered 2013-07-02: 2 mg via ORAL
  Filled 2013-07-01: qty 2
  Filled 2013-07-01: qty 1

## 2013-07-01 MED ORDER — FOLIC ACID 1 MG PO TABS
1.0000 mg | ORAL_TABLET | Freq: Every day | ORAL | Status: DC
  Administered 2013-07-01 – 2013-07-02 (×2): 1 mg via ORAL
  Filled 2013-07-01 (×2): qty 1

## 2013-07-01 MED ORDER — ALUM & MAG HYDROXIDE-SIMETH 200-200-20 MG/5ML PO SUSP: 30.0000 mL | ORAL | Status: DC | PRN

## 2013-07-01 MED ORDER — ZOLPIDEM TARTRATE 5 MG PO TABS: 5.0000 mg | ORAL_TABLET | Freq: Every evening | ORAL | Status: DC | PRN

## 2013-07-01 MED ORDER — ONDANSETRON HCL 4 MG PO TABS
4.0000 mg | ORAL_TABLET | Freq: Three times a day (TID) | ORAL | Status: DC | PRN
  Administered 2013-07-02: 4 mg via ORAL
  Filled 2013-07-01: qty 1

## 2013-07-01 MED ORDER — NICOTINE 21 MG/24HR TD PT24
21.0000 mg | MEDICATED_PATCH | Freq: Every day | TRANSDERMAL | Status: DC
  Administered 2013-07-01 – 2013-07-02 (×2): 21 mg via TRANSDERMAL
  Filled 2013-07-01 (×2): qty 1

## 2013-07-01 MED ORDER — VITAMIN B-1 100 MG PO TABS
100.0000 mg | ORAL_TABLET | Freq: Every day | ORAL | Status: DC
  Administered 2013-07-01 – 2013-07-02 (×2): 100 mg via ORAL
  Filled 2013-07-01 (×2): qty 1

## 2013-07-01 MED ORDER — LORAZEPAM 2 MG/ML IJ SOLN: 1.0000 mg | Freq: Four times a day (QID) | INTRAMUSCULAR | Status: DC | PRN

## 2013-07-01 MED ORDER — THIAMINE HCL 100 MG/ML IJ SOLN: 100.0000 mg | Freq: Every day | INTRAMUSCULAR | Status: DC

## 2013-07-01 MED ORDER — LORAZEPAM 1 MG PO TABS: 0.0000 mg | ORAL_TABLET | Freq: Two times a day (BID) | ORAL | Status: DC

## 2013-07-01 MED ORDER — IBUPROFEN 200 MG PO TABS: 600.0000 mg | ORAL_TABLET | Freq: Three times a day (TID) | ORAL | Status: DC | PRN

## 2013-07-01 MED ORDER — ADULT MULTIVITAMIN W/MINERALS CH
1.0000 | ORAL_TABLET | Freq: Every day | ORAL | Status: DC
Start: 2013-07-01 — End: 2013-07-02
  Administered 2013-07-01 – 2013-07-02 (×2): 1 via ORAL
  Filled 2013-07-01 (×2): qty 1

## 2013-07-01 MED ORDER — LORAZEPAM 1 MG PO TABS
1.0000 mg | ORAL_TABLET | Freq: Four times a day (QID) | ORAL | Status: DC | PRN
  Administered 2013-07-01 – 2013-07-02 (×2): 1 mg via ORAL
  Filled 2013-07-01 (×2): qty 1

## 2013-07-01 NOTE — ED Provider Notes (Signed)
3:42 PM  Date: 07/01/2013  Rate: 139  Rhythm: sinus tachycardia  QRS Axis: right  Intervals: normal PQRS:  Right ventricular hypertrophy  ST/T Wave abnormalities: normal  Conduction Disutrbances:none  Narrative Interpretation: Abnormal EKG  Old EKG Reviewed: none available    Carleene Cooper III, MD 07/01/13 1545
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
## 2013-07-02 ENCOUNTER — Encounter (HOSPITAL_COMMUNITY): Payer: Self-pay | Admitting: *Deleted

## 2013-07-02 ENCOUNTER — Inpatient Hospital Stay (HOSPITAL_COMMUNITY)
Admission: AD | Admit: 2013-07-02 | Discharge: 2013-07-05 | DRG: 881 | Disposition: A | Discharge status: Home / Self Care | Payer: Self-pay | Source: Intra-hospital | Attending: Psychiatry | Admitting: Psychiatry

## 2013-07-02 DIAGNOSIS — Z79899 Other long term (current) drug therapy: Secondary | ICD-10-CM

## 2013-07-02 DIAGNOSIS — Z7289 Other problems related to lifestyle: Secondary | ICD-10-CM

## 2013-07-02 DIAGNOSIS — F1010 Alcohol abuse, uncomplicated: Secondary | ICD-10-CM

## 2013-07-02 DIAGNOSIS — F329 Major depressive disorder, single episode, unspecified: Principal | ICD-10-CM | POA: Diagnosis present

## 2013-07-02 DIAGNOSIS — F411 Generalized anxiety disorder: Secondary | ICD-10-CM

## 2013-07-02 DIAGNOSIS — F3289 Other specified depressive episodes: Principal | ICD-10-CM | POA: Diagnosis present

## 2013-07-02 DIAGNOSIS — F1994 Other psychoactive substance use, unspecified with psychoactive substance-induced mood disorder: Secondary | ICD-10-CM

## 2013-07-02 DIAGNOSIS — K219 Gastro-esophageal reflux disease without esophagitis: Secondary | ICD-10-CM | POA: Diagnosis present

## 2013-07-02 DIAGNOSIS — F639 Impulse disorder, unspecified: Secondary | ICD-10-CM

## 2013-07-02 DIAGNOSIS — F32A Depression, unspecified: Secondary | ICD-10-CM

## 2013-07-02 MED ORDER — ALUM & MAG HYDROXIDE-SIMETH 200-200-20 MG/5ML PO SUSP
30.0000 mL | ORAL | Status: DC | PRN
  Administered 2013-07-02 – 2013-07-05 (×3): 30 mL via ORAL

## 2013-07-02 MED ORDER — MAGNESIUM HYDROXIDE 400 MG/5ML PO SUSP: 30.0000 mL | Freq: Every day | ORAL | Status: DC | PRN

## 2013-07-02 MED ORDER — HYDROXYZINE HCL 25 MG PO TABS
25.0000 mg | ORAL_TABLET | Freq: Four times a day (QID) | ORAL | Status: DC | PRN
  Administered 2013-07-03 – 2013-07-05 (×4): 25 mg via ORAL

## 2013-07-02 MED ORDER — TRAZODONE HCL 50 MG PO TABS
50.0000 mg | ORAL_TABLET | Freq: Every evening | ORAL | Status: DC | PRN
  Administered 2013-07-02 – 2013-07-03 (×3): 50 mg via ORAL
  Filled 2013-07-02 (×7): qty 1

## 2013-07-02 MED ORDER — ACETAMINOPHEN 325 MG PO TABS
650.0000 mg | ORAL_TABLET | Freq: Four times a day (QID) | ORAL | Status: DC | PRN
  Administered 2013-07-04: 650 mg via ORAL

## 2013-07-02 MED ORDER — LORAZEPAM 1 MG PO TABS: 1.0000 mg | ORAL_TABLET | Freq: Three times a day (TID) | ORAL | Status: DC | PRN

## 2013-07-02 NOTE — ED Notes (Signed)
PT WALKING OUT WITH SECURITY THEN COMPLAINED OF NAUSEA. MEDICATED PT.

## 2013-07-02 NOTE — Progress Notes (Signed)
Adult Psychoeducational Group Note  Date:  07/02/2013 Time:  9:28 PM  Group Topic/Focus:  Healthy Communication:   The focus of this group is to discuss communication, barriers to communication, as well as healthy ways to communicate with others.  Participation Level:  Active  Participation Quality:  Appropriate, Sharing and Supportive  Affect:  Appropriate  Cognitive:  Appropriate  Insight: Appropriate  Engagement in Group:  Engaged and Supportive  Modes of Intervention:  Problem-solving  Additional Comments:  Isaish was very cooperative and supportive of others during group.  He shared with the group that he was grateful of his mom being there for him during his time of need.   Annell Greening [place] 07/02/2013, 9:28 PM

## 2013-07-02 NOTE — ED Notes (Signed)
Patient states he is feeling irritable. States just and irritated anxiety. Requests ativan.

## 2013-07-02 NOTE — ED Provider Notes (Signed)
Medical screening examination/treatment/procedure(s) were performed by non-physician practitioner and as supervising physician I was immediately available for consultation/collaboration.   Carleene Cooper III, MD 07/02/13 1240

## 2013-07-02 NOTE — ED Notes (Signed)
Patient has awakend and is eating breakfast. He is aware his mother has called and does not wish to call her now. States he will call her later

## 2013-07-02 NOTE — ED Notes (Signed)
Pt mother called and gave pt sitter message for him to call her when he awakens. Mother advised that we are not allowed to give out information on patient care over the phone, will give pt message to call her

## 2013-07-02 NOTE — Progress Notes (Signed)
Vitals taken by [facility] RN at [employer]

## 2013-07-02 NOTE — BH Assessment (Signed)
Assessment Note  Update:  Received call from [facility] stating pt accepted by Donell Sievert, PA to Dr. Elsie Saas to bed 500-1 and that pt could be transported.  Updated EDP Bernette Mayers and ED staff.  Updated assessment disposition, completed support paperwork, and faxed to [facility] to log.      Disposition:  Disposition Initial Assessment Completed for this Encounter: Yes Disposition of Patient: Inpatient treatment program Type of inpatient treatment program: Adult Patient referred to: Other (Comment) (Pt accepted [facility])  On Site Evaluation by:   Reviewed with Physician:  Asencion Gowda, Rennis Harding 07/02/2013 3:47 PM
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
## 2013-07-02 NOTE — BH Assessment (Signed)
[facility]HH Assessment Progress Note      Per run log from early this am client has been accepted by Donell Sievert PA for the 500 hall when a bed becomes available.

## 2013-07-03 DIAGNOSIS — F489 Nonpsychotic mental disorder, unspecified: Secondary | ICD-10-CM

## 2013-07-03 DIAGNOSIS — F1994 Other psychoactive substance use, unspecified with psychoactive substance-induced mood disorder: Secondary | ICD-10-CM

## 2013-07-03 DIAGNOSIS — F1310 Sedative, hypnotic or anxiolytic abuse, uncomplicated: Secondary | ICD-10-CM

## 2013-07-03 DIAGNOSIS — F1010 Alcohol abuse, uncomplicated: Secondary | ICD-10-CM

## 2013-07-03 MED ORDER — SERTRALINE HCL 50 MG PO TABS
50.0000 mg | ORAL_TABLET | Freq: Every day | ORAL | Status: DC
  Administered 2013-07-04 – 2013-07-05 (×2): 50 mg via ORAL
  Filled 2013-07-03 (×3): qty 1
  Filled 2013-07-03: qty 14

## 2013-07-03 MED ORDER — BUSPIRONE HCL 15 MG PO TABS
7.5000 mg | ORAL_TABLET | Freq: Three times a day (TID) | ORAL | Status: DC
  Filled 2013-07-03 (×2): qty 1

## 2013-07-03 MED ORDER — MIRTAZAPINE 15 MG PO TBDP
15.0000 mg | ORAL_TABLET | Freq: Every day | ORAL | Status: DC
  Administered 2013-07-04 (×2): 15 mg via ORAL
  Filled 2013-07-03: qty 14
  Filled 2013-07-03 (×4): qty 1

## 2013-07-03 NOTE — Progress Notes (Signed)
Patient ID: Thomas Hunter, male   DOB: 11-Sep-1986, 27 y.o.   MRN: [id number] D: pt. Visible in dayroom, reports "I want to go home." Pt. Shows cuts to upper arms. "I got to make better choices" Pt. Reports he has three kids ages 54, 86 and five months. Pt. Denies SHI. A: Writer introduced self to client and encouraged group. Staff will monitor q65min for safety. R: Pt. Is safe on the unit. Pt. Attended group.

## 2013-07-03 NOTE — [facility] Group Notes (Signed)
[facility] LCSW Aftercare Discharge Planning Group Note  07/03/2013  8:45 AM  Participation Quality:  Did Not Attend   Reyes Ivan, LCSWA 07/03/2013 9:51 AM
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
## 2013-07-03 NOTE — Progress Notes (Signed)
Patient ID: Thomas Hunter, male   DOB: 04-05-1986, 27 y.o.   MRN: [id number]  D: Pt states he is passive SI, but contracts for safety.  Pt denies HI/AV. Pt is pleasant and cooperative. Pt states he gets panic attacks from time to time. Pt does state that he gets anxious a lot.     A: Pt was offered support and encouragement. Pt was given scheduled medications. Pt was encourage to attend groups. Q 15 minute checks were done for safety.   R:Pt attends groups and interacts well with peers and staff. Pt is taking medication. Pt has no complaints at this time.Pt receptive to treatment and safety maintained on unit.

## 2013-07-03 NOTE — Progress Notes (Signed)
D: Patient denies SI/HI and A/V hallucinations; patient reports sleep is fair and reported some drowsiness but now no longer reports thist; reports appetite is improving ; reports energy level is low ; reports ability to pay attention is low; rates depression as 7/10; rates hopelessness 4/10; patient started to complains of anxiety after a phone call with his girlfriend; patient was reporting anxiety around the crowd; patient reports that he only cuts himself when he drinks; patient reports he had 4 40 oz before coming into the emergency department  A: Monitored q 15 minutes; patient encouraged to attend groups; patient educated about medications; patient given medications per physician orders; patient encouraged to express feelings and/or concerns  R: Patient is complaining of anxiety because of thinking about his finances and the lack of money and also thinking about his girlfriend and providing for their new child; patient's interaction with staff and peers is appropriate;  patient is taking medications as prescribed and tolerating medications; patient is attended the afternoon group after much encouraging; patient declines any withdrawal symtpoms

## 2013-07-03 NOTE — Progress Notes (Signed)
Recreation Therapy Notes   Date: 07.23.2014  Time: 3:00pm Location: [address] Dayroom  Group Topic/Focus: Problem Solving, Communication  Participation Level:  Active  Participation Quality:  Appropriate, Attentive   Affect:  Euthymic to bright at times  Cognitive:  Appropriate  Additional Comments: Activity: Life Boat ; Explanation: Patients were given the following scenario: We have chartered a Radio producer for the afternoon with the below listed guests, half way through out trip the [employer] a leak and we start to sink. We can put everyone in the group on a life raft and sail back to shore. Including the people in this room we can eight of out guests. The list of guest is as follows: Materials engineer, [address], Janyth Pupa, Zorita Pang, [place], Runner, broadcasting/film/video, Chef, Curator, Personnel officer, Ex-Convict, Ex-Marine, Pregnant Woman, Physician, Nurse.   Patient actively participated in group activity. Patient voiced his opinion and debated appropriately with group members. Patient listened to wrap up discussion, nodded in agreement with statements of peer, but did not contribute to discussion. Patient smiled and laughed with group members throughout session.   Marykay Lex Bertel Venard, LRT/CTRS  Jearl Klinefelter 07/03/2013 4:43 PM

## 2013-07-03 NOTE — Progress Notes (Signed)
Adult Psychoeducational Group Note  Date:  07/03/2013 Time:  10:31 PM  Group Topic/Focus:  Wrap-Up Group:   The focus of this group is to help patients review their daily goal of treatment and discuss progress on daily workbooks.  Participation Level:  Active  Participation Quality:  Appropriate  Affect:  Appropriate  Cognitive:  Appropriate  Insight: Good  Engagement in Group:  Engaged and Supportive  Modes of Intervention:  Discussion and Support  Additional Comments:  Pt stated he had a good day today. Pt stated he missed the first two groups because he was feeling tired and did not want to get up. Pt stated once he did get up he was able to open up to his peers and got some good advice from them.  Caswell Corwin 07/03/2013, 10:31 PM

## 2013-07-03 NOTE — Tx Team (Signed)
Interdisciplinary Treatment Plan Update (Adult)  Date: 07/03/2013  Time Reviewed:  9:45 AM  Progress in Treatment: Attending groups: Yes Participating in groups:  Yes Taking medication as prescribed:  Yes Tolerating medication:  Yes Family/Significant othe contact made: CSW assessing  Patient understands diagnosis:  Yes Discussing patient identified problems/goals with staff:  Yes Medical problems stabilized or resolved:  Yes Denies suicidal/homicidal ideation: Yes Issues/concerns per patient self-inventory:  Yes Other:  New problem(s) identified: N/A  Discharge Plan or Barriers: CSW assessing for appropriate referrals.  Reason for Continuation of Hospitalization: Anxiety Depression Medication Stabilization  Comments: N/A  Estimated length of stay: 3-5 days  For review of initial/current patient goals, please see plan of care.  Attendees: Patient:     Family:     Physician:     Nursing:    07/03/2013 9:37 AM   Clinical Social Worker:  Zohra Clavel Horton, LCSWA 07/03/2013 9:37 AM   Other: Neil Mashburn, PA 07/03/2013 9:37 AM   Other:  Maseta Dorley, MA care coordination 07/03/2013 9:37 AM   Other:  Quylle Hodnett, LCSW 07/03/2013 9:37 AM   Other:     Other:    Other:    Other:    Other:    Other:    Other:     Scribe for Treatment Team:   Horton, Saamir Armstrong Nicole, 07/03/2013 9:37 AM   

## 2013-07-03 NOTE — [facility] Group Notes (Signed)
[facility] LCSW Group Therapy  07/03/2013  1:15 PM   Type of Therapy:  Group Therapy  Participation Level:  Active  Participation Quality:  Appropriate and Attentive  Affect:  Appropriate, Depressed and Flat  Cognitive:  Alert and Appropriate  Insight:  Developing/Improving and Engaged  Engagement in Therapy:  Developing/Improving and Engaged  Modes of Intervention:  Clarification, Confrontation, Discussion, Education, Exploration, Limit-setting, Orientation, Problem-solving, Rapport Building, Dance movement psychotherapist, Socialization and Support  Summary of Progress/Problems: The topic for group today was emotional regulation.  This group focused on both positive and negative emotion identification and allowed group members to process ways to identify feelings, regulate negative emotions, and find healthy ways to manage internal/external emotions. Group members were asked to reflect on a time when their reaction to an emotion led to a negative outcome and explored how alternative responses using emotion regulation would have benefited them. Group members were also asked to discuss a time when emotion regulation was utilized when a negative emotion was experienced. Pt shared that he acted on his emotions negatively when his girlfriend left him.  Pt states that he cut his arm to hurt himself, which resulted in this hospitalization.  Pt states that looking back on this situation he could've walked away.  Pt was able to process how his emotions has resulted in certain consequences.  Pt actively participated and was engaged in group discussion.   Thomas Hunter, LCSWA 07/03/2013 1:55 PM

## 2013-07-03 NOTE — Progress Notes (Signed)
Adult Psychoeducational Group Note  Date:  07/03/2013 Time:  10:41 AM  Group Topic/Focus:  "Therapeutic Activity"  Participation Level:  None  Participation Quality:  Appropriate  Affect:  Depressed  Cognitive:  Appropriate  Insight: None  Engagement in Group:  None  Modes of Intervention:  Activity  Additional Comments:  Pt attended group toward the end and had limited interactions and did not participate in the activities.   Sharyn Lull 07/03/2013, 10:41 AM

## 2013-07-03 NOTE — [facility] Suicide Risk Assessment (Signed)
Suicide Risk Assessment  Admission Assessment     Nursing information obtained from:    Demographic factors:    Current Mental Status:    Loss Factors:    Historical Factors:    Risk Reduction Factors:     CLINICAL FACTORS:   Severe Anxiety and/or Agitation Depression:   Anhedonia Comorbid alcohol abuse/dependence Impulsivity Recent sense of peace/wellbeing Alcohol/Substance Abuse/Dependencies Personality Disorders:   Cluster B Comorbid alcohol abuse/dependence Comorbid depression Unstable or Poor Therapeutic Relationship Medical Diagnoses and Treatments/Surgeries  COGNITIVE FEATURES THAT CONTRIBUTE TO RISK:  Closed-mindedness Polarized thinking    SUICIDE RISK:   Mild:  Suicidal ideation of limited frequency, intensity, duration, and specificity.  There are no identifiable plans, no associated intent, mild dysphoria and related symptoms, good self-control (both objective and subjective assessment), few other risk factors, and identifiable protective factors, including available and accessible social support.  PLAN OF CARE: Admit for inpatient stabilization for depression, self injurious behaviors and alcohol detox treatment if needed. May needs to watch on CIWA protocol.  I certify that inpatient services furnished can reasonably be expected to improve the patient's condition.  Jamayah Myszka,JANARDHAHA R. 07/03/2013, 12:06 PM

## 2013-07-03 NOTE — Progress Notes (Signed)
Patient ID: Thomas Hunter, male   DOB: 02-Oct-1986, 27 y.o.   MRN: [id number]  Admission was finished after pt had already been brought back to the unit.
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
## 2013-07-04 DIAGNOSIS — F411 Generalized anxiety disorder: Secondary | ICD-10-CM | POA: Diagnosis present

## 2013-07-04 DIAGNOSIS — F639 Impulse disorder, unspecified: Secondary | ICD-10-CM | POA: Diagnosis present

## 2013-07-04 DIAGNOSIS — F329 Major depressive disorder, single episode, unspecified: Secondary | ICD-10-CM | POA: Diagnosis present

## 2013-07-04 DIAGNOSIS — Z7289 Other problems related to lifestyle: Secondary | ICD-10-CM | POA: Diagnosis present

## 2013-07-04 MED ORDER — BUSPIRONE HCL 5 MG PO TABS
7.5000 mg | ORAL_TABLET | Freq: Three times a day (TID) | ORAL | Status: DC
  Administered 2013-07-04 – 2013-07-05 (×7): 7.5 mg via ORAL
  Filled 2013-07-04 (×2): qty 1.5
  Filled 2013-07-04: qty 32
  Filled 2013-07-04: qty 2
  Filled 2013-07-04 (×2): qty 1.5
  Filled 2013-07-04 (×2): qty 32
  Filled 2013-07-04 (×4): qty 1.5

## 2013-07-04 MED ORDER — NICOTINE 14 MG/24HR TD PT24
14.0000 mg | MEDICATED_PATCH | Freq: Every day | TRANSDERMAL | Status: DC
  Administered 2013-07-04 – 2013-07-05 (×2): 14 mg via TRANSDERMAL
  Filled 2013-07-04 (×3): qty 1

## 2013-07-04 NOTE — Progress Notes (Signed)
Adult Psychoeducational Group Note  Date:  07/04/2013 Time:  9:00 AM  Group Topic/Focus:  Dimensions of Wellness:   The focus of this group is to introduce the topic of wellness and discuss the role each dimension of wellness plays in total health.  Participation Level:  Did Not Attend  Additional Comments:  Pt. was in bed sleep during group.  Harold Barban E 07/04/2013, 11:27 AM

## 2013-07-04 NOTE — [facility] Group Notes (Signed)
[facility] LCSW Group Therapy  07/04/2013  1:15 PM   Type of Therapy:  Group Therapy  Participation Level:  Active  Participation Quality:  Appropriate and Attentive  Affect:  Appropriate, Depressed and Flat  Cognitive:  Alert and Appropriate  Insight:  Developing/Improving and Engaged  Engagement in Therapy:  Developing/Improving and Engaged  Modes of Intervention:  Activity, Clarification, Discussion, Education, Exploration, Limit-setting, Orientation, Problem-solving, Rapport Building, Reality Testing, Socialization and Support  Summary of Progress/Problems:Patient was attentive and engaged with speaker from [employer].  Patient was attentive to speaker while they shared their story of dealing with mental health and overcoming it.  Patient expressed interest in their programs and services and received information on their agency.  Patient processed ways they can relate to the speaker.     Geordie Nooney Horton, LCSWA 07/04/2013 3:16 PM   

## 2013-07-04 NOTE — [facility] Suicide Risk Assessment (Signed)
[facility] INPATIENT:  Family/Significant Other Suicide Prevention Education  Suicide Prevention Education:  Education Completed; Casandra Doffing - mother [phone]),  (name of family member/significant other) has been identified by the patient as the family member/significant other with whom the patient will be residing, and identified as the person(s) who will aid the patient in the event of a mental health crisis (suicidal ideations/suicide attempt).  With written consent from the patient, the family member/significant other has been provided the following suicide prevention education, prior to the and/or following the discharge of the patient.  The suicide prevention education provided includes the following:  Suicide risk factors  Suicide prevention and interventions  National Suicide Hotline telephone number  [facility] assessment telephone number  [facility] Emergency Assistance 911  [facility] and/or Residential Mobile Crisis Unit telephone number  Request made of family/significant other to:  Remove weapons (e.g., guns, rifles, knives), all items previously/currently identified as safety concern.    Remove drugs/medications (over-the-counter, prescriptions, illicit drugs), all items previously/currently identified as a safety concern.  The family member/significant other verbalizes understanding of the suicide prevention education information provided.  The family member/significant other agrees to remove the items of safety concern listed above.  Mother voiced concern with pt's mood instability, stating that he was doing better yesterday and today sounds angry on the phone.    Carmina Miller 07/04/2013, 2:25 PM

## 2013-07-04 NOTE — Progress Notes (Signed)
D:  Patient's self inventory sheet, patient sleeps well, good appetite, normal energy level, good attention span.  Rated depression and hopelessness #1.  Has experienced chilling.  Denied SI.   Has felt lightheaded,dizzy, blurred vision.  Pain goal #1, worst pain #3.  Plans to stay away from alcohol and spend more time with kids.  Wants to go home.  Does have discharge plans.  No problems taking meds. A:  Medications administered per MD orders. R:  Denied SI and HI.  Denied A/V hallucinations.

## 2013-07-04 NOTE — Progress Notes (Signed)
[facility] MD Progress Note  07/04/2013 1:43 PM Thomas Hunter  MRN:  [id number] Subjective:  "I finally slept last night. I'm a little nauseated." Diagnosis:  Self-mutilation   ADL's:  Intact  Sleep: Good  Appetite:  Good  Suicidal Ideation:  decreasing Homicidal Ideation:   denies AEB (as evidenced by):  Psychiatric Specialty Exam: Review of Systems  Constitutional: Negative.  Negative for fever, chills, weight loss, malaise/fatigue and diaphoresis.  HENT: Negative for congestion and sore throat.   Eyes: Negative for blurred vision, double vision and photophobia.  Respiratory: Negative for cough, shortness of breath and wheezing.   Cardiovascular: Negative for chest pain, palpitations and PND.  Gastrointestinal: Negative for heartburn, nausea, vomiting, abdominal pain, diarrhea and constipation.  Musculoskeletal: Negative for myalgias, joint pain and falls.  Neurological: Negative for dizziness, tingling, tremors, sensory change, speech change, focal weakness, seizures, loss of consciousness, weakness and headaches.  Endo/Heme/Allergies: Negative for polydipsia. Does not bruise/bleed easily.  Psychiatric/Behavioral: Negative for depression, suicidal ideas, hallucinations, memory loss and substance abuse. The patient is not nervous/anxious and does not have insomnia.     Blood pressure 125/85, pulse 67, temperature 97.1 F (36.2 C), temperature source Oral, resp. rate 16, height 5\' 7"  (1.702 m), weight 58.06 kg (128 lb).Body mass index is 20.04 kg/(m^2).  General Appearance: Casual  Eye Contact::  Good  Speech:  Clear and Coherent  Volume:  Normal  Mood:  Anxious and Depressed  Affect:  Congruent  Thought Process:  Goal Directed  Orientation:  Full (Time, Place, and Person)  Thought Content:  WDL  Suicidal Thoughts:  Yes.  without intent/plan  Homicidal Thoughts:  No  Memory:  NA  Judgement:  Fair  Insight:  improving  Psychomotor Activity:  Increased and Restlessness   Concentration:  Fair  Recall:  Fair  Akathisia:  No  Handed:  Right  AIMS (if indicated):     Assets:  Communication Skills Desire for Improvement Housing Physical Health Social Support  Sleep:  Number of Hours: 5.5   Current Medications: Current Facility-Administered Medications  Medication Dose Route Frequency Provider Last Rate Last Dose  . acetaminophen (TYLENOL) tablet 650 mg  650 mg Oral Q6H PRN Rachael Fee, MD      . alum & mag hydroxide-simeth (MAALOX/MYLANTA) 200-200-20 MG/5ML suspension 30 mL  30 mL Oral Q4H PRN Rachael Fee, MD   30 mL at 07/03/13 2028  . busPIRone (BUSPAR) tablet 7.5 mg  7.5 mg Oral TID Nehemiah Settle, MD   7.5 mg at 07/04/13 1144  . hydrOXYzine (ATARAX/VISTARIL) tablet 25 mg  25 mg Oral Q6H PRN Rachael Fee, MD   25 mg at 07/03/13 1813  . magnesium hydroxide (MILK OF MAGNESIA) suspension 30 mL  30 mL Oral Daily PRN Rachael Fee, MD      . mirtazapine (REMERON SOL-TAB) disintegrating tablet 15 mg  15 mg Oral QHS Verne Spurr, PA-C   15 mg at 07/04/13 0043  . nicotine (NICODERM CQ - dosed in mg/24 hours) patch 14 mg  14 mg Transdermal Daily Nehemiah Settle, MD   14 mg at 07/04/13 1143  . sertraline (ZOLOFT) tablet 50 mg  50 mg Oral QPC breakfast Verne Spurr, PA-C   50 mg at 07/04/13 4098    Lab Results: No results found for this or any previous visit (from the past 48 hour(s)).  Physical Findings: AIMS: Facial and Oral Movements Muscles of Facial Expression: None, normal Lips and Perioral Area: None, normal Jaw:  None, normal Tongue: None, normal,Extremity Movements Upper (arms, wrists, hands, fingers): None, normal Lower (legs, knees, ankles, toes): None, normal, Trunk Movements Neck, shoulders, hips: None, normal, Overall Severity Severity of abnormal movements (highest score from questions above): None, normal Incapacitation due to abnormal movements: None, normal Patient's awareness of abnormal movements (rate only  patient's report): No Awareness, Dental Status Current problems with teeth and/or dentures?: No Does patient usually wear dentures?: No  CIWA:  CIWA-Ar Total: 0 COWS:     Treatment Plan Summary: Daily contact with patient to assess and evaluate symptoms and progress in treatment Medication management  Plan: 1. Continue crisis management and stabilization. 2. Medication management to reduce current symptoms to base line and improve patient's overall level of functioning 3. Treat health problems as indicated. 4. Develop treatment plan to decrease risk of relapse upon discharge and the need for readmission. 5. Psycho-social education regarding relapse prevention and self care. 6. Health care follow up as needed for medical problems. 7. Continue home medications where appropriate. 8. Continue Zoloft, Buspar, Remeron as written. 9. Psychoeducation done regarding anxiety, risks, benefits of medications. 10. ELOS: 1-2 days.   Medical Decision Making Problem Points:  Established problem, stable/improving (1) Data Points:  Review or order medicine tests (1)  I certify that inpatient services furnished can reasonably be expected to improve the patient's condition.  Rona Ravens. Russell Quinney RPAC 1:56 PM 07/04/2013

## 2013-07-04 NOTE — Progress Notes (Signed)
07/04/2013         Time: 2130      Group Topic/Focus: Thomas Hunter  Participation Level: Active  Participation Quality: Appropriate and Supportive  Affect: Appropriate  Cognitive: Appropriate   Additional Comments:    [name] 07/04/2013 10:42 PM

## 2013-07-05 DIAGNOSIS — F411 Generalized anxiety disorder: Secondary | ICD-10-CM

## 2013-07-05 DIAGNOSIS — F1910 Other psychoactive substance abuse, uncomplicated: Secondary | ICD-10-CM

## 2013-07-05 DIAGNOSIS — F329 Major depressive disorder, single episode, unspecified: Principal | ICD-10-CM

## 2013-07-05 MED ORDER — BUSPIRONE HCL 7.5 MG PO TABS: 7.5000 mg | ORAL_TABLET | Freq: Three times a day (TID) | ORAL | Status: DC

## 2013-07-05 MED ORDER — SERTRALINE HCL 50 MG PO TABS: 50.0000 mg | ORAL_TABLET | Freq: Every day | ORAL | Status: DC

## 2013-07-05 MED ORDER — MIRTAZAPINE 15 MG PO TBDP: 15.0000 mg | ORAL_TABLET | Freq: Every day | ORAL | Status: DC

## 2013-07-05 NOTE — Progress Notes (Signed)
Psychoeducational Group Note  Date:  07/05/2013 Time:  1000  Group Topic/Focus:  Therapeutic Activity  Participation Level: Did Not Attend  Participation Quality:  Not Applicable  Affect:  Not Applicable  Cognitive:  Not Applicable  Insight:  Not Applicable  Engagement in Group: Not Applicable  Additional Comments:  Shivaay did not attend group and remained in bed during the group session.  Karleen Hampshire Brittini 07/05/2013, 1:37 PM

## 2013-07-05 NOTE — [facility] Suicide Risk Assessment (Signed)
Suicide Risk Assessment  Discharge Assessment     Demographic Factors:  Male, Caucasian and Low socioeconomic status  Mental Status Per Nursing Assessment::   On Admission:     Current Mental Status by Physician: NA  Loss Factors: Financial problems/change in socioeconomic status  Historical Factors: Family history of mental illness or substance abuse and Impulsivity  Risk Reduction Factors:   Responsible for children under 28 years of age, Sense of responsibility to family, Religious beliefs about death, Employed, Living with another person, especially a relative, Positive social support, Positive therapeutic relationship and Positive coping skills or problem solving skills  Continued Clinical Symptoms:  Depression:   Comorbid alcohol abuse/dependence Hopelessness Impulsivity Recent sense of peace/wellbeing Alcohol/Substance Abuse/Dependencies  Cognitive Features That Contribute To Risk:  Polarized thinking    Suicide Risk:  Minimal: No identifiable suicidal ideation.  Patients presenting with no risk factors but with morbid ruminations; may be classified as minimal risk based on the severity of the depressive symptoms  Discharge Diagnoses:   AXIS I:  Depressive Disorder NOS, Generalized Anxiety Disorder, Substance Abuse and Substance Induced Mood Disorder AXIS II:  Deferred AXIS III:   Past Medical History  Diagnosis Date  . GERD (gastroesophageal reflux disease)   . Depression   . Anxiety    AXIS IV:  other psychosocial or environmental problems and problems related to social environment AXIS V:  61-70 mild symptoms  Plan Of Care/Follow-up recommendations:  Activity:  As tolerated Diet:  Regular  Is patient on multiple antipsychotic therapies at discharge:  No   Has Patient had three or more failed trials of antipsychotic monotherapy by history:  No  Recommended Plan for Multiple Antipsychotic Therapies: Not applicable  Yaniel Limbaugh,JANARDHAHA  R. 07/05/2013, 11:11 AM

## 2013-07-05 NOTE — Progress Notes (Signed)
[facility] Adult Case Management Discharge Plan :  Will you be returning to the same living situation after discharge: Yes,  returning home At discharge, do you have transportation home?:Yes,  mother will pick pt up Do you have the ability to pay for your medications:Yes,  access to meds  Release of information consent forms completed and in the chart;  Patient's signature needed at discharge.  Patient to Follow up at: Follow-up Information   Follow up with [facility] On 07/09/2013. (Walk in on this date for hospital discharge appointment.  Walk in clinic is Monday - Friday 8 am - 3 pm.)    Contact information:   [address], [place] [address] Phone: [phone] Fax: [phone]      Patient denies SI/HI:   Yes,  denies SI/HI    Safety Planning and Suicide Prevention discussed:  Yes,  discussed with pt and pt's mother.  See suicide prevention note.   Carmina Miller 07/05/2013, 10:09 AM

## 2013-07-05 NOTE — Discharge Summary (Signed)
Physician Discharge Summary Note  Patient:  Thomas Hunter is an 27 y.o., male MRN:  [id number] DOB:  21-Nov-1986 Patient phone:  [phone] (home)  Patient address:   [address] [place] [address],   Date of Admission:  07/02/2013 Date of Discharge: 07/05/2013  Reason for Admission:  Self mutilation  Discharge Diagnoses: Principal Problem:   Self-mutilation Active Problems:   Generalized anxiety disorder   Impulse control disorder   Depression  Review of Systems  Constitutional: Negative.  Negative for fever, chills, weight loss, malaise/fatigue and diaphoresis.  HENT: Negative for congestion and sore throat.   Eyes: Negative for blurred vision, double vision and photophobia.  Respiratory: Negative for cough, shortness of breath and wheezing.   Cardiovascular: Negative for chest pain, palpitations and PND.  Gastrointestinal: Negative for heartburn, nausea, vomiting, abdominal pain, diarrhea and constipation.  Musculoskeletal: Negative for myalgias, joint pain and falls.  Neurological: Negative for dizziness, tingling, tremors, sensory change, speech change, focal weakness, seizures, loss of consciousness, weakness and headaches.  Endo/Heme/Allergies: Negative for polydipsia. Does not bruise/bleed easily.  Psychiatric/Behavioral: Negative for depression, suicidal ideas, hallucinations, memory loss and substance abuse. The patient is not nervous/anxious and does not have insomnia.    Discharge Diagnoses:  AXIS I: Depressive Disorder NOS, Generalized Anxiety Disorder, Substance Abuse and Substance Induced Mood Disorder  AXIS II: Deferred  AXIS III:  Past Medical History   Diagnosis  Date   .  GERD (gastroesophageal reflux disease)    .  Depression    .  Anxiety     AXIS IV: other psychosocial or environmental problems and problems related to social environment  AXIS V: 61-70 mild symptoms  Level of Care:  OP  Hospital Course:          Megan was admitted  voluntarily after he presented to the ED after cutting himself across the torso and upper arms.  He noted that he had been arguing with his current girlfriend the mother of his third child by 3 separate women. He reported being extremely anxious, had poor sleep, increasing depression and poor appetite. He noted that he no longer had the ability or will to cope with his multiple stressors.  He was evaluated and accepted to [facility] for further stabilization and crisis management.       PAULO KEIMIG was admitted to the unit and evaluated. The symptoms were identified as extreme anxiety, poor coping skills, low tolerance for delayed gratification, insomnia, fatigue, hopelessness, impaired memory, recurrent thoughts of death, impulsivity, distractibility, irritable mood with mood lability.       The patient was oriented to the unit and encouraged to participate in unit programming and medication management was initiated.        He was evaluated by a clinical provider to ascertain the patient's response to treatment.  Improvement was noted by the patient's report of decreasing symptoms, improved sleep and appetite, affect, medication tolerance, behavior, and participation in unit programming.  The patient was asked each day to complete a self inventory noting mood, mental status, pain, new symptoms, anxiety and concerns.        The patient responded well to medication and being in a therapeutic and supportive environment. Positive and appropriate behavior was noted and the patient was motivated for recovery. The patient worked closely with the treatment team and case manager to develop a discharge plan with appropriate goals. Coping skills, problem solving as well as relaxation therapies were also part of the unit programming.  By the day of discharge the patient was in much improved condition than upon admission.  Symptoms were reported as significantly decreased or resolved completely.  The patient denied  SI/HI and voiced no AVH. He/she was motivated to continue taking medication with a goal of continued improvement in mental health.  He was discharged home with a plan to follow up as noted below.  Consults:  None  Significant Diagnostic Studies:  None  Discharge Vitals:   Blood pressure 139/89, pulse 76, temperature 97.1 F (36.2 C), temperature source Oral, resp. rate 16, height 5\' 7"  (1.702 m), weight 58.06 kg (128 lb). Body mass index is 20.04 kg/(m^2). Lab Results:   No results found for this or any previous visit (from the past 72 hour(s)).  Physical Findings: AIMS: Facial and Oral Movements Muscles of Facial Expression: None, normal Lips and Perioral Area: None, normal Jaw: None, normal Tongue: None, normal,Extremity Movements Upper (arms, wrists, hands, fingers): None, normal Lower (legs, knees, ankles, toes): None, normal, Trunk Movements Neck, shoulders, hips: None, normal, Overall Severity Severity of abnormal movements (highest score from questions above): None, normal Incapacitation due to abnormal movements: None, normal Patient's awareness of abnormal movements (rate only patient's report): No Awareness, Dental Status Current problems with teeth and/or dentures?: No Does patient usually wear dentures?: No  CIWA:  CIWA-Ar Total: 0 COWS:     Psychiatric Specialty Exam: See Psychiatric Specialty Exam and Suicide Risk Assessment completed by Attending Physician prior to discharge.  Discharge destination:  Home  Is patient on multiple antipsychotic therapies at discharge:  No   Has Patient had three or more failed trials of antipsychotic monotherapy by history:  No  Recommended Plan for Multiple Antipsychotic Therapies: Not  applicable  Discharge Orders   Future Orders Complete By Expires     Diet - low sodium heart healthy  As directed     Discharge instructions  As directed     Comments:      Take all of your medications as directed. Be sure to keep all of your  follow up appointments.  If you are unable to keep your follow up appointment, call your Doctor's office to let them know, and reschedule.  Make sure that you have enough medication to last until your appointment. Be sure to get plenty of rest. Going to bed at the same time each night will help. Try to avoid sleeping during the day.  Increase your activity as tolerated. Regular exercise will help you to sleep better and improve your mental health. Eating a heart healthy diet is recommended. Try to avoid salty or fried foods. Be sure to avoid all alcohol and illegal drugs.    Increase activity slowly  As directed         Medication List    STOP taking these medications       clonazePAM 0.5 MG tablet  Commonly known as:  KLONOPIN      TAKE these medications     Indication   busPIRone 7.5 MG tablet  Commonly known as:  BUSPAR  Take 1 tablet (7.5 mg total) by mouth 3 (three) times daily.   Indication:  Anxiety Disorder     mirtazapine 15 MG disintegrating tablet  Commonly known as:  REMERON SOL-TAB  Take 1 tablet (15 mg total) by mouth at bedtime.   Indication:  Trouble Sleeping     ranitidine 150 MG tablet  Commonly known as:  ZANTAC  Take 150 mg by mouth daily as needed (for  stomach pain).   Stomach pain   sertraline 50 MG tablet  Commonly known as:  ZOLOFT  Take 1 tablet (50 mg total) by mouth daily after breakfast.   Indication:  Anxiety Disorder           Follow-up Information   Follow up with [facility] On 07/09/2013. (Walk in on this date for hospital discharge appointment.  Walk in clinic is Monday - Friday 8 am - 3 pm.)    Contact information:   [address] [place] [address] Phone: [phone] Fax: [phone]      Follow-up recommendations:   Activities: Resume activity as tolerated. Diet: Heart healthy low sodium diet Tests: Follow up testing will be determined by your out patient provider.  Comments:    Total Discharge Time:  Greater than 30  minutes.  Signed:Neil T. Mashburn RPAC 9:51 AM 07/05/2013   Reviewed the information documented and agree with the treatment plan.  Ravynn Hogate,JANARDHAHA R. 07/16/2013 12:49 PM

## 2013-07-05 NOTE — [facility] Group Notes (Addendum)
[facility] LCSW Group Therapy  Feelings Around Relapse 1:15 -2:30        07/05/2013  3:42 PM    Type of Therapy:  Group Therapy  Participation Level:  Appropriate  Participation Quality:  Appropriate  Affect:  Appropriate  Cognitive:  Attentive Appropriate  Insight:  Engaged  Engagement in Therapy:  Engaged  Modes of Intervention:  Discussion Exploration Problem-Solving Supportive  Summary of Progress/Problems:  The topic for today was feelings around relapse.   Patient shared relapsing for him would be acting out on angry feelings. Patient processed feelings toward relapse and was able to relate to peers. Patient identified coping skills that can be used to prevent a relapse.   Wynn Banker 07/05/2013  3:42 PM

## 2013-07-05 NOTE — Progress Notes (Signed)
Patient ID: Thomas Hunter, male   DOB: Dec 28, 1985, 28 y.o.   MRN: [id number]  D: Pt denies SI/HI/AVH/pain. Pt is pleasant and cooperative. Pt states he plans on getting back to work and possibly getting a second job to keep his mind off certain things.   A: Pt was offered support and encouragement. Pt was given scheduled medications. Pt was encourage to attend groups. Q 15 minute checks were done for safety.   R:Pt attended Karaoke and interacts well with peers and staff. Pt is taking medication. Pt has no complaints at this time.Pt receptive to treatment and safety maintained on unit.

## 2013-07-05 NOTE — Progress Notes (Signed)
Adult Psychoeducational Group Note  Date:  07/05/2013 Time:  12:25 PM  Group Topic/Focus:  Relapse Prevention Planning:   The focus of this group is to define relapse and discuss the need for planning to combat relapse.  Participation Level:  None  Participation Quality:  Attentive  Affect:  Flat  Cognitive:  Pt did not speak in group.  Insight: None  Engagement in Group:  None  Modes of Intervention:  Activity, Discussion, Socialization and Support  Additional Comments:  Pt came to group near the very end. Pt did not speak in group but was attentive.   Cathlean Cower 07/05/2013, 12:25 PM

## 2013-07-05 NOTE — Tx Team (Signed)
Interdisciplinary Treatment Plan Update (Adult)  Date: 07/05/2013  Time Reviewed:  9:45 AM  Progress in Treatment: Attending groups: Yes Participating in groups:  Yes Taking medication as prescribed:  Yes Tolerating medication:  Yes Family/Significant othe contact made: Yes Patient understands diagnosis:  Yes Discussing patient identified problems/goals with staff:  Yes Medical problems stabilized or resolved:  Yes Denies suicidal/homicidal ideation: Yes Issues/concerns per patient self-inventory:  Yes Other:  New problem(s) identified: N/A  Discharge Plan or Barriers: Pt has follow up scheduled at [facility] for medication management and therapy.    Reason for Continuation of Hospitalization: Stable to d/c  Comments: N/A  Estimated length of stay: D/C today  For review of initial/current patient goals, please see plan of care.  Attendees: Patient:     Family:     Physician:  Dr. Javier Glazier 07/05/2013 10:13 AM   Nursing:   Rowan Blase, RN 07/05/2013 10:13 AM   Clinical Social Worker:  Reyes Ivan, LCSWA 07/05/2013 10:13 AM   Other: Verne Spurr, PA 07/05/2013 10:13 AM   Other:  Alease Frame, RN 07/05/2013 10:13 AM   Other:  Juline Patch, LCSW 07/05/2013 10:13 AM   Other:  Onnie Boer, case manager 07/05/2013 10:13 AM   Other:    Other:    Other:    Other:    Other:    Other:     Scribe for Treatment Team:   Carmina Miller, 07/05/2013 10:13 AM

## 2013-07-05 NOTE — [facility] Group Notes (Signed)
[facility] LCSW Aftercare Discharge Planning Group Note   07/05/2013 8:45 AM  Participation Quality:  Alert and Appropriate   Mood/Affect:  Appropriate and Calm  Depression Rating:  0  Anxiety Rating:  5  Thoughts of Suicide:  Pt denies SI/HI  Will you contract for safety?   Yes  Current AVH:  No  Plan for Discharge/Comments:  Pt attended discharge planning group and actively participated in group.  CSW provided pt with today's workbook.  Pt reports feeling stable to d/c today.  Pt states that he will return home with grandmother.  Pt will follow up at [facility] for medication management and therapy.  No further needs voiced by pt at this time.    Transportation Means: Pt reports access to transportation  Supports: No supports mentioned at this time  Reyes Ivan, LCSWA 07/05/2013 9:59 AM

## 2013-07-05 NOTE — Progress Notes (Signed)
D. Pt.  Denies SI/HI.  Discharge instructions reviewed with patient.  Pt. Verbalized an understanding of discharge instructions. A.   All belongings returned to patient.  Pt. Discharged. R.  Encouragement and support given.  Pt. Receptive.

## 2013-07-10 NOTE — Progress Notes (Addendum)
Patient Discharge Instructions:  After Visit Summary (AVS):   Faxed to:  07/11/13 Psychiatric Admission Assessment Note:   Faxed to:  07/11/13 Suicide Risk Assessment - Discharge Assessment:   Faxed to:  07/11/13 Faxed/Sent to the Next Level Care provider:  07/11/13 Faxed to [facility] @ [phone]  Jerelene Redden, 07/10/2013, 4:14 PM

## 2013-11-13 ENCOUNTER — Emergency Department (HOSPITAL_COMMUNITY)
Admission: EM | Admit: 2013-11-13 | Discharge: 2013-11-14 | Disposition: A | Discharge status: Home / Self Care | Payer: Self-pay | Attending: Emergency Medicine | Admitting: Emergency Medicine

## 2013-11-13 DIAGNOSIS — K219 Gastro-esophageal reflux disease without esophagitis: Secondary | ICD-10-CM | POA: Insufficient documentation

## 2013-11-13 DIAGNOSIS — F17200 Nicotine dependence, unspecified, uncomplicated: Secondary | ICD-10-CM | POA: Insufficient documentation

## 2013-11-13 DIAGNOSIS — F10929 Alcohol use, unspecified with intoxication, unspecified: Secondary | ICD-10-CM

## 2013-11-13 DIAGNOSIS — IMO0002 Reserved for concepts with insufficient information to code with codable children: Secondary | ICD-10-CM | POA: Insufficient documentation

## 2013-11-13 DIAGNOSIS — R451 Restlessness and agitation: Secondary | ICD-10-CM

## 2013-11-13 DIAGNOSIS — F1010 Alcohol abuse, uncomplicated: Secondary | ICD-10-CM | POA: Insufficient documentation

## 2013-11-13 DIAGNOSIS — Z79899 Other long term (current) drug therapy: Secondary | ICD-10-CM | POA: Insufficient documentation

## 2013-11-13 DIAGNOSIS — R4689 Other symptoms and signs involving appearance and behavior: Secondary | ICD-10-CM

## 2013-11-13 DIAGNOSIS — F3289 Other specified depressive episodes: Secondary | ICD-10-CM | POA: Insufficient documentation

## 2013-11-13 DIAGNOSIS — F411 Generalized anxiety disorder: Secondary | ICD-10-CM | POA: Insufficient documentation

## 2013-11-13 DIAGNOSIS — F911 Conduct disorder, childhood-onset type: Secondary | ICD-10-CM | POA: Insufficient documentation

## 2013-11-13 DIAGNOSIS — F639 Impulse disorder, unspecified: Secondary | ICD-10-CM | POA: Insufficient documentation

## 2013-11-13 DIAGNOSIS — F329 Major depressive disorder, single episode, unspecified: Secondary | ICD-10-CM | POA: Insufficient documentation

## 2013-11-13 LAB — CBC
HCT: 52.2 % — ABNORMAL HIGH (ref 39.0–52.0)
Hemoglobin: 18.5 g/dL — ABNORMAL HIGH (ref 13.0–17.0)
MCH: 34.5 pg — ABNORMAL HIGH (ref 26.0–34.0)
MCHC: 35.4 g/dL (ref 30.0–36.0)
MCV: 97.2 fL (ref 78.0–100.0)
Platelets: 290 10*3/uL (ref 150–400)
RBC: 5.37 MIL/uL (ref 4.22–5.81)
RDW: 12.5 % (ref 11.5–15.5)
WBC: 15.1 10*3/uL — ABNORMAL HIGH (ref 4.0–10.5)

## 2013-11-13 MED ORDER — HALOPERIDOL LACTATE 5 MG/ML IJ SOLN
5.0000 mg | Freq: Once | INTRAMUSCULAR | Status: AC
  Administered 2013-11-13: 5 mg via INTRAMUSCULAR

## 2013-11-13 MED ORDER — LORAZEPAM 2 MG/ML IJ SOLN
1.0000 mg | Freq: Once | INTRAMUSCULAR | Status: AC
  Administered 2013-11-13: 1 mg via INTRAVENOUS

## 2013-11-13 MED ORDER — LORAZEPAM 2 MG/ML IJ SOLN
2.0000 mg | Freq: Once | INTRAMUSCULAR | Status: AC
  Administered 2013-11-13: 2 mg via INTRAVENOUS

## 2013-11-13 MED ORDER — LORAZEPAM 2 MG/ML IJ SOLN
INTRAMUSCULAR | Status: AC
  Filled 2013-11-13: qty 1
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
## 2013-11-14 ENCOUNTER — Emergency Department (HOSPITAL_COMMUNITY): Payer: Self-pay

## 2013-11-14 LAB — COMPREHENSIVE METABOLIC PANEL
ALT: 21 U/L (ref 0–53)
AST: 27 U/L (ref 0–37)
Albumin: 4.9 g/dL (ref 3.5–5.2)
Calcium: 9.5 mg/dL (ref 8.4–10.5)
Sodium: 146 mEq/L — ABNORMAL HIGH (ref 135–145)
Total Protein: 8.3 g/dL (ref 6.0–8.3)

## 2013-11-14 LAB — BASIC METABOLIC PANEL
BUN: 8 mg/dL (ref 6–23)
CO2: 20 mEq/L (ref 19–32)
Chloride: 111 mEq/L (ref 96–112)
Creatinine, Ser: 0.79 mg/dL (ref 0.50–1.35)
Glucose, Bld: 96 mg/dL (ref 70–99)
Potassium: 3.6 mEq/L (ref 3.5–5.1)

## 2013-11-14 LAB — RAPID URINE DRUG SCREEN, HOSP PERFORMED
Amphetamines: NOT DETECTED
Cocaine: NOT DETECTED
Opiates: NOT DETECTED
Tetrahydrocannabinol: POSITIVE — AB

## 2013-11-14 LAB — URINALYSIS, ROUTINE W REFLEX MICROSCOPIC
Bilirubin Urine: NEGATIVE
Glucose, UA: NEGATIVE mg/dL
Ketones, ur: NEGATIVE mg/dL
Protein, ur: 30 mg/dL — AB
Urobilinogen, UA: 0.2 mg/dL (ref 0.0–1.0)

## 2013-11-14 LAB — URINE MICROSCOPIC-ADD ON

## 2013-11-14 MED ORDER — SODIUM CHLORIDE 0.9 % IV SOLN
1000.0000 mL | Freq: Once | INTRAVENOUS | Status: AC
  Administered 2013-11-14: 1000 mL via INTRAVENOUS

## 2013-11-14 MED ORDER — SODIUM CHLORIDE 0.9 % IV SOLN: 1000.0000 mL | INTRAVENOUS | Status: DC

## 2013-11-14 NOTE — ED Notes (Addendum)
Pt released from soft wrist restraints; soft ankle restraints still in place.  Pt cooperative and answering appropriately. [employer] still at bedside.
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
## 2013-11-15 ENCOUNTER — Telehealth (HOSPITAL_BASED_OUTPATIENT_CLINIC_OR_DEPARTMENT_OTHER): Payer: Self-pay | Admitting: *Deleted

## 2013-11-16 ENCOUNTER — Emergency Department (HOSPITAL_COMMUNITY)
Admission: EM | Admit: 2013-11-16 | Discharge: 2013-11-17 | Disposition: A | Discharge status: Home / Self Care | Payer: Self-pay | Attending: Emergency Medicine | Admitting: Emergency Medicine

## 2013-11-16 ENCOUNTER — Encounter (HOSPITAL_COMMUNITY): Payer: Self-pay | Admitting: Emergency Medicine

## 2013-11-16 DIAGNOSIS — F17200 Nicotine dependence, unspecified, uncomplicated: Secondary | ICD-10-CM | POA: Insufficient documentation

## 2013-11-16 DIAGNOSIS — K219 Gastro-esophageal reflux disease without esophagitis: Secondary | ICD-10-CM | POA: Insufficient documentation

## 2013-11-16 DIAGNOSIS — S20219A Contusion of unspecified front wall of thorax, initial encounter: Secondary | ICD-10-CM | POA: Insufficient documentation

## 2013-11-16 DIAGNOSIS — S20212A Contusion of left front wall of thorax, initial encounter: Secondary | ICD-10-CM

## 2013-11-16 DIAGNOSIS — F3289 Other specified depressive episodes: Secondary | ICD-10-CM | POA: Insufficient documentation

## 2013-11-16 DIAGNOSIS — F411 Generalized anxiety disorder: Secondary | ICD-10-CM | POA: Insufficient documentation

## 2013-11-16 DIAGNOSIS — Z79899 Other long term (current) drug therapy: Secondary | ICD-10-CM | POA: Insufficient documentation

## 2013-11-16 DIAGNOSIS — F329 Major depressive disorder, single episode, unspecified: Secondary | ICD-10-CM | POA: Insufficient documentation
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
## 2013-11-17 ENCOUNTER — Emergency Department (HOSPITAL_COMMUNITY): Payer: Self-pay

## 2013-11-17 MED ORDER — TRAMADOL HCL 50 MG PO TABS: 50.0000 mg | ORAL_TABLET | Freq: Once | ORAL | Status: DC

## 2013-11-17 MED ORDER — TRAMADOL HCL 50 MG PO TABS
50.0000 mg | ORAL_TABLET | Freq: Once | ORAL | Status: AC
  Administered 2013-11-17: 50 mg via ORAL
  Filled 2013-11-17: qty 1

## 2013-11-17 NOTE — ED Provider Notes (Signed)
CSN: [id number]     Arrival date & time 11/16/13  2248 History   First MD Initiated Contact with Patient 11/17/13 0006     Chief Complaint  Patient presents with  . Chest Pain   (Consider location/radiation/quality/duration/timing/severity/associated sxs/prior Treatment) Patient is a 27 y.o. male presenting with chest pain. The history is provided by the patient.  Chest Pain Pain location:  L lateral chest Pain quality: aching   Pain radiates to:  Does not radiate Pain radiates to the back: no   Pain severity:  Moderate Onset quality:  Unable to specify Duration:  2 days Timing:  Constant Chronicity:  New Context: breathing, lifting, movement and trauma   Relieved by:  Nothing Worsened by:  Certain positions, deep breathing and movement Ineffective treatments: Tylenol and ibuprofen. Associated symptoms: no fever and no shortness of breath     Past Medical History  Diagnosis Date  . GERD (gastroesophageal reflux disease)   . Depression   . Anxiety    History reviewed. No pertinent past surgical history. History reviewed. No pertinent family history. History  Substance Use Topics  . Smoking status: Current Every Day Smoker -- 1.00 packs/day    Types: Cigarettes  . Smokeless tobacco: Not on file  . Alcohol Use: Yes     Comment: 1 beer per week    Review of Systems  Constitutional: Negative for fever and chills.  Respiratory: Negative for shortness of breath.   Cardiovascular: Positive for chest pain.  Musculoskeletal: Negative for neck pain.  Skin: Negative for color change and wound.  All other systems reviewed and are negative.    Allergies  Review of patient's allergies indicates no known allergies.  Home Medications   Current Outpatient Rx  Name  Route  Sig  Dispense  Refill  . acetaminophen (TYLENOL) 500 MG tablet   Oral   Take 1,000 mg by mouth every 6 (six) hours as needed for mild pain.         . busPIRone (BUSPAR) 7.5 MG tablet   Oral   Take 1  tablet (7.5 mg total) by mouth 3 (three) times daily.   30 tablet   0   . ibuprofen (ADVIL,MOTRIN) 200 MG tablet   Oral   Take 400 mg by mouth every 6 (six) hours as needed for mild pain.         . mirtazapine (REMERON SOL-TAB) 15 MG disintegrating tablet   Oral   Take 1 tablet (15 mg total) by mouth at bedtime.   30 tablet   0   . ranitidine (ZANTAC) 150 MG tablet   Oral   Take 150 mg by mouth daily as needed (for stomach pain).         . traMADol (ULTRAM) 50 MG tablet   Oral   Take 1 tablet (50 mg total) by mouth once.   30 tablet   0    BP 139/90  Pulse 101  Temp(Src) 98.5 F (36.9 C)  Resp 18  Ht 5\' 9"  (1.753 m)  Wt 140 lb (63.504 kg)  BMI 20.67 kg/m2  SpO2 97% Physical Exam  Nursing note and vitals reviewed. Constitutional: He is oriented to person, place, and time. He appears well-developed and well-nourished.  HENT:  Head: Normocephalic.  Eyes: Pupils are equal, round, and reactive to light.  Neck: Normal range of motion.  Cardiovascular: Normal rate and regular rhythm.   Pulmonary/Chest: Effort normal and breath sounds normal. He has no wheezes. He exhibits no tenderness.  Musculoskeletal: Normal range of motion.  Neurological: He is alert and oriented to person, place, and time.  Skin: Skin is warm and dry.    ED Course  Procedures (including critical care time) Labs Review Labs Reviewed - No data to display Imaging Review Dg Chest 2 View  11/17/2013   CLINICAL DATA:  Chest pain.  EXAM: CHEST  2 VIEW  COMPARISON:  07/01/2013  FINDINGS: Two views of the chest demonstrate a pectus excavatum deformity. Lungs are clear bilaterally. Normal appearance of the heart and mediastinum.  IMPRESSION: No acute cardiopulmonary disease.   Electronically Signed   By: Richarda Overlie M.D.   On: 11/17/2013 01:23    EKG Interpretation   None       MDM   1. Chest wall contusion, left, initial encounter         Arman Filter, NP 11/17/13 4098

## 2013-11-18 NOTE — ED Provider Notes (Signed)
Medical screening examination/treatment/procedure(s) were performed by non-physician practitioner and as supervising physician I was immediately available for consultation/collaboration.    Sunnie Nielsen, MD 11/18/13 [phone]

## 2014-06-08 ENCOUNTER — Encounter (HOSPITAL_COMMUNITY): Payer: Self-pay | Admitting: Emergency Medicine

## 2014-06-08 ENCOUNTER — Emergency Department (HOSPITAL_COMMUNITY)
Admission: EM | Admit: 2014-06-08 | Discharge: 2014-06-08 | Disposition: A | Discharge status: Home / Self Care | Payer: Self-pay | Attending: Emergency Medicine | Admitting: Emergency Medicine

## 2014-06-08 DIAGNOSIS — F17200 Nicotine dependence, unspecified, uncomplicated: Secondary | ICD-10-CM | POA: Insufficient documentation

## 2014-06-08 DIAGNOSIS — Z23 Encounter for immunization: Secondary | ICD-10-CM | POA: Insufficient documentation

## 2014-06-08 DIAGNOSIS — W268XXA Contact with other sharp object(s), not elsewhere classified, initial encounter: Secondary | ICD-10-CM | POA: Insufficient documentation

## 2014-06-08 DIAGNOSIS — Y929 Unspecified place or not applicable: Secondary | ICD-10-CM | POA: Insufficient documentation

## 2014-06-08 DIAGNOSIS — Y9289 Other specified places as the place of occurrence of the external cause: Secondary | ICD-10-CM | POA: Insufficient documentation

## 2014-06-08 DIAGNOSIS — F411 Generalized anxiety disorder: Secondary | ICD-10-CM | POA: Insufficient documentation

## 2014-06-08 DIAGNOSIS — Z79899 Other long term (current) drug therapy: Secondary | ICD-10-CM | POA: Insufficient documentation

## 2014-06-08 DIAGNOSIS — F3289 Other specified depressive episodes: Secondary | ICD-10-CM | POA: Insufficient documentation

## 2014-06-08 DIAGNOSIS — S61511A Laceration without foreign body of right wrist, initial encounter: Secondary | ICD-10-CM

## 2014-06-08 DIAGNOSIS — K219 Gastro-esophageal reflux disease without esophagitis: Secondary | ICD-10-CM | POA: Insufficient documentation

## 2014-06-08 DIAGNOSIS — S61509A Unspecified open wound of unspecified wrist, initial encounter: Secondary | ICD-10-CM | POA: Insufficient documentation

## 2014-06-08 DIAGNOSIS — F329 Major depressive disorder, single episode, unspecified: Secondary | ICD-10-CM | POA: Insufficient documentation

## 2014-06-08 DIAGNOSIS — Y9389 Activity, other specified: Secondary | ICD-10-CM | POA: Insufficient documentation

## 2014-06-08 DIAGNOSIS — Y939 Activity, unspecified: Secondary | ICD-10-CM | POA: Insufficient documentation

## 2014-06-08 MED ORDER — TETANUS-DIPHTH-ACELL PERTUSSIS 5-2.5-18.5 LF-MCG/0.5 IM SUSP
0.5000 mL | Freq: Once | INTRAMUSCULAR | Status: AC
  Administered 2014-06-08: 0.5 mL via INTRAMUSCULAR
  Filled 2014-06-08: qty 0.5

## 2014-06-08 NOTE — ED Notes (Signed)
Pt given pager and informed that we are waiting to get him into a bed.

## 2014-06-08 NOTE — ED Notes (Addendum)
Rt. Wrist laceration.  Bleeding on controlled. Slammed it on a bottle. etoh on board. VSS.

## 2014-06-08 NOTE — ED Notes (Signed)
The pt has a laceration to the rt wrist he did on a glass bottle.  No active bleeding

## 2014-06-08 NOTE — ED Notes (Signed)
Pt. Been uncooperative and using vulgar language to staff; pt. Approaching patients and asking questions. Pt. Told to stay in ED and be cooperative. Police has talked with patient; but when gops are not looking, pt. Continues to approach patients and be uncooperative to staff.

## 2014-06-08 NOTE — ED Notes (Signed)
Told by [employer] that patient was running up [address].

## 2014-06-08 NOTE — ED Notes (Addendum)
Pt. Told to be cooperative; gpd in triage room when pt. This was told to pt. In the presence of gpd.  Pt. Making stereotyping remarks to Triage RN. Pt. Told to control his further inappropriate verbal language.

## 2014-06-08 NOTE — ED Notes (Signed)
Pt c/o rt wrist laceration. Pt states he smashed a liquor bottle with his hand. Pt has 2 inch laceration to rt wrist, bleeding controlled. Pt reports pain 10/10.

## 2014-06-08 NOTE — ED Notes (Addendum)
Pt. Just left [facility]a; now is back for medical evaluation for rt. Arm laceration.  Pt. Cut wrist on a liquor bottle.

## 2014-06-08 NOTE — ED Provider Notes (Signed)
CSN: [id number]     Arrival date & time 06/08/14  0245 History   First MD Initiated Contact with Patient 06/08/14 [phone]     Chief Complaint  Patient presents with  . Extremity Laceration     (Consider location/radiation/quality/duration/timing/severity/associated sxs/prior Treatment) HPI This patient is a generally healthy 28 year old right-hand dominant man who presents with laceration to the anterior aspect of his right wrist. He accidentally lacerated the skin at this region while he was playing at beer drinking game. His wrist hit the peer bottle which broke and caused a laceration.  Injury occurred just prior to the patient's arrival to the emergency department. The patient has stinging, moderately severe pain. His last tetanus is unknown. The pain is nonradiating. It's worse with flexion and extension of the wrist. The patient denies any other injuries. He denies paresthesias and motor weakness.  Past Medical History  Diagnosis Date  . GERD (gastroesophageal reflux disease)   . Depression   . Anxiety    No past surgical history on file. No family history on file. History  Substance Use Topics  . Smoking status: Current Every Day Smoker -- 1.00 packs/day    Types: Cigarettes  . Smokeless tobacco: Not on file  . Alcohol Use: Yes     Comment: 1 beer per week    Review of Systems Ten point review of symptoms performed and is negative with the exception of symptoms noted above.     Allergies  Review of patient's allergies indicates no known allergies.  Home Medications   Prior to Admission medications   Medication Sig Start Date End Date Taking? Authorizing Provider  acetaminophen (TYLENOL) 500 MG tablet Take 1,000 mg by mouth every 6 (six) hours as needed for mild pain.    Historical Provider, MD  busPIRone (BUSPAR) 7.5 MG tablet Take 1 tablet (7.5 mg total) by mouth 3 (three) times daily. 07/05/13   Nena Polio, PA-C  ibuprofen (ADVIL,MOTRIN) 200 MG tablet Take 400 mg  by mouth every 6 (six) hours as needed for mild pain.    Historical Provider, MD  mirtazapine (REMERON SOL-TAB) 15 MG disintegrating tablet Take 1 tablet (15 mg total) by mouth at bedtime. 07/05/13   Nena Polio, PA-C  ranitidine (ZANTAC) 150 MG tablet Take 150 mg by mouth daily as needed (for stomach pain).    Historical Provider, MD  traMADol (ULTRAM) 50 MG tablet Take 1 tablet (50 mg total) by mouth once. 11/17/13   Garald Balding, NP   BP 137/92  Pulse 131  Temp(Src) 98.6 F (37 C) (Oral)  Resp 18  SpO2 97% Physical Exam Gen: well developed and well nourished appearing Head: NCAT Eyes: PERL, EOMI Nose: no epistaixis or rhinorrhea Mouth/throat: mucosa is moist and pink Neck: Altered infection Lungs: CTA B, no wheezing, rhonchi or rales CV: RRR rate of 84 beats per minute on my exam, no murmur, extremities appear well perfused.  Abd:  nondistended Back: Normal to inspection Skin: warm and dry Ext: RUE: 3cm linear laceration anterolateral aspect of wrist, no active bleeding, no exposed muscle, tendon or bone. Normal flexion and extension of wrist and all fingers with normal strength Neuro: CN ii-xii grossly intact, no focal deficits Psyche; normal affect,  calm and cooperative.   ED Course  Procedures (including critical care time) Labs Review  LACERATION REPAIR Performed by: Elyn Peers Authorized by: Elyn Peers Consent: Verbal consent obtained. Risks and benefits: risks, benefits and alternatives were discussed Consent given by: patient Patient identity confirmed: provided  demographic data Prepped and Draped in normal sterile fashion Wound explored  Laceration Location: right wrist  Laceration Length: 3 cm  No Foreign Bodies seen or palpated  Anesthesia: local infiltration  Local anesthetic: lidocaine 1% with epinephrine  Anesthetic total: 2 ml  Irrigation method: syringe Amount of cleaning: standard  Skin closure: with 4.0 prolene  Number of sutures:  5  Technique: interrupted  Patient tolerance: Patient tolerated the procedure well with no immediate complications.  MDM   Final diagnoses:  Laceration of wrist, right, initial encounter       Elyn Peers, MD 06/08/14 [phone]

## 2014-08-06 ENCOUNTER — Emergency Department (HOSPITAL_COMMUNITY): Payer: Self-pay

## 2014-08-06 ENCOUNTER — Encounter (HOSPITAL_COMMUNITY): Payer: Self-pay | Admitting: Emergency Medicine

## 2014-08-06 ENCOUNTER — Emergency Department (HOSPITAL_COMMUNITY)
Admission: EM | Admit: 2014-08-06 | Discharge: 2014-08-06 | Disposition: A | Discharge status: Home / Self Care | Payer: Self-pay | Attending: Emergency Medicine | Admitting: Emergency Medicine

## 2014-08-06 DIAGNOSIS — F17200 Nicotine dependence, unspecified, uncomplicated: Secondary | ICD-10-CM | POA: Insufficient documentation

## 2014-08-06 DIAGNOSIS — F411 Generalized anxiety disorder: Secondary | ICD-10-CM | POA: Insufficient documentation

## 2014-08-06 DIAGNOSIS — R0602 Shortness of breath: Secondary | ICD-10-CM | POA: Insufficient documentation

## 2014-08-06 DIAGNOSIS — R209 Unspecified disturbances of skin sensation: Secondary | ICD-10-CM | POA: Insufficient documentation

## 2014-08-06 DIAGNOSIS — R42 Dizziness and giddiness: Secondary | ICD-10-CM | POA: Insufficient documentation

## 2014-08-06 DIAGNOSIS — R0789 Other chest pain: Secondary | ICD-10-CM | POA: Insufficient documentation

## 2014-08-06 DIAGNOSIS — R002 Palpitations: Secondary | ICD-10-CM | POA: Insufficient documentation

## 2014-08-06 DIAGNOSIS — K219 Gastro-esophageal reflux disease without esophagitis: Secondary | ICD-10-CM | POA: Insufficient documentation

## 2014-08-06 DIAGNOSIS — F419 Anxiety disorder, unspecified: Secondary | ICD-10-CM

## 2014-08-06 DIAGNOSIS — F329 Major depressive disorder, single episode, unspecified: Secondary | ICD-10-CM | POA: Insufficient documentation

## 2014-08-06 DIAGNOSIS — F3289 Other specified depressive episodes: Secondary | ICD-10-CM | POA: Insufficient documentation

## 2014-08-06 DIAGNOSIS — R079 Chest pain, unspecified: Secondary | ICD-10-CM | POA: Insufficient documentation

## 2014-08-06 DIAGNOSIS — Z79899 Other long term (current) drug therapy: Secondary | ICD-10-CM | POA: Insufficient documentation

## 2014-08-06 DIAGNOSIS — R109 Unspecified abdominal pain: Secondary | ICD-10-CM | POA: Insufficient documentation

## 2014-08-06 LAB — BASIC METABOLIC PANEL
Anion gap: 13 (ref 5–15)
BUN: 9 mg/dL (ref 6–23)
CHLORIDE: 99 meq/L (ref 96–112)
CO2: 23 meq/L (ref 19–32)
CREATININE: 0.85 mg/dL (ref 0.50–1.35)
Calcium: 9.3 mg/dL (ref 8.4–10.5)
GFR calc Af Amer: >90 mL/min (ref >90)
GFR calc non Af Amer: >90 mL/min (ref >90)
Glucose, Bld: 107 mg/dL — ABNORMAL HIGH (ref 70–99)
Potassium: 3.9 mEq/L (ref 3.7–5.3)
Sodium: 135 mEq/L — ABNORMAL LOW (ref 137–147)

## 2014-08-06 LAB — CBC
HEMATOCRIT: 44.6 % (ref 39.0–52.0)
Hemoglobin: 15.8 g/dL (ref 13.0–17.0)
MCH: 34 pg (ref 26.0–34.0)
MCHC: 35.4 g/dL (ref 30.0–36.0)
MCV: 95.9 fL (ref 78.0–100.0)
Platelets: 224 10*3/uL (ref 150–400)
RBC: 4.65 MIL/uL (ref 4.22–5.81)
RDW: 11.8 % (ref 11.5–15.5)
WBC: 8.1 10*3/uL (ref 4.0–10.5)

## 2014-08-06 LAB — I-STAT TROPONIN, ED: Troponin i, poc: 0 ng/mL (ref 0.00–0.08)

## 2014-08-06 LAB — LIPASE, BLOOD: LIPASE: 25 U/L (ref 11–59)

## 2014-08-06 NOTE — Progress Notes (Signed)
[place],  Did not get to see patient but will be sending information on [place] program to help patient establish primary care, using the address provided.

## 2014-08-06 NOTE — ED Notes (Addendum)
Pt c/o central chest pressure and mid abd pain w/ numbness in hands and ears.  All sx started in the last 45 mins.  Positive trousseau's sign.

## 2014-08-06 NOTE — Discharge Instructions (Signed)

## 2014-08-06 NOTE — ED Provider Notes (Signed)
CSN: [id number]     Arrival date & time 08/06/14  1334 History   First MD Initiated Contact with Patient 08/06/14 1454     Chief Complaint  Patient presents with  . Chest Pain  . Abdominal Pain  . Numbness     (Consider location/radiation/quality/duration/timing/severity/associated sxs/prior Treatment) Patient is a 28 y.o. male presenting with chest pain and abdominal pain. The history is provided by the patient.  Chest Pain Pain location:  Substernal area Pain quality: pressure   Pain radiates to:  Does not radiate Pain radiates to the back: no   Pain severity:  Moderate Onset quality:  Sudden Duration:  15 minutes Timing: once. Progression:  Resolved Chronicity:  Recurrent (similar sx w/ anxiety before) Context comment:  While driving to pick up his girlfriend, grandmother just passed, several court dates upcoming Relieved by: spontaneously. Worsened by:  Nothing tried Ineffective treatments:  None tried Associated symptoms: palpitations and shortness of breath   Associated symptoms: no abdominal pain, no cough, no fever, no headache, no nausea, no numbness and not vomiting   Abdominal Pain Associated symptoms: chest pain and shortness of breath   Associated symptoms: no cough, no diarrhea, no dysuria, no fever, no hematuria, no nausea and no vomiting     Past Medical History  Diagnosis Date  . GERD (gastroesophageal reflux disease)   . Depression   . Anxiety    No past surgical history on file. No family history on file. History  Substance Use Topics  . Smoking status: Current Every Day Smoker -- 1.00 packs/day    Types: Cigarettes  . Smokeless tobacco: Not on file  . Alcohol Use: Yes     Comment: 1 beer per week    Review of Systems  Constitutional: Negative for fever.  HENT: Negative for drooling and rhinorrhea.   Eyes: Negative for pain.  Respiratory: Positive for shortness of breath. Negative for cough.   Cardiovascular: Positive for chest pain and  palpitations. Negative for leg swelling.  Gastrointestinal: Negative for nausea, vomiting, abdominal pain and diarrhea.  Genitourinary: Negative for dysuria and hematuria.  Musculoskeletal: Negative for gait problem and neck pain.  Skin: Negative for color change.  Neurological: Negative for numbness and headaches.  Hematological: Negative for adenopathy.  Psychiatric/Behavioral: Negative for behavioral problems.       Anxious  All other systems reviewed and are negative.     Allergies  Review of patient's allergies indicates no known allergies.  Home Medications   Prior to Admission medications   Medication Sig Start Date End Date Taking? Authorizing Provider  acetaminophen (TYLENOL) 500 MG tablet Take 1,000 mg by mouth every 6 (six) hours as needed for mild pain.   Yes Historical Provider, MD  busPIRone (BUSPAR) 7.5 MG tablet Take 7.5 mg by mouth 3 (three) times daily.   Yes Historical Provider, MD  calcium carbonate (TUMS EX) 750 MG chewable tablet Chew 3 tablets by mouth once as needed for heartburn.   Yes Historical Provider, MD   BP 96/64  Pulse 99  Temp(Src) 98.4 F (36.9 C) (Oral)  Resp 16  SpO2 100% Physical Exam  Nursing note and vitals reviewed. Constitutional: He is oriented to person, place, and time. He appears well-developed and well-nourished.  HENT:  Head: Normocephalic and atraumatic.  Right Ear: External ear normal.  Left Ear: External ear normal.  Nose: Nose normal.  Mouth/Throat: Oropharynx is clear and moist. No oropharyngeal exudate.  Eyes: Conjunctivae and EOM are normal. Pupils are equal, round, and  reactive to light.  Neck: Normal range of motion. Neck supple.  Cardiovascular: Normal rate, regular rhythm, normal heart sounds and intact distal pulses.  Exam reveals no gallop and no friction rub.   No murmur heard. Pulmonary/Chest: Effort normal and breath sounds normal. No respiratory distress. He has no wheezes.  Abdominal: Soft. Bowel sounds are  normal. He exhibits no distension. There is no tenderness. There is no rebound and no guarding.  Musculoskeletal: Normal range of motion. He exhibits no edema and no tenderness.  Neurological: He is alert and oriented to person, place, and time.  Skin: Skin is warm and dry.  Psychiatric: He has a normal mood and affect. His behavior is normal.    ED Course  Procedures (including critical care time) Labs Review Labs Reviewed  CBC  BASIC METABOLIC PANEL  LIPASE, BLOOD  I-STAT [place], ED    Imaging Review Dg Chest 2 View  08/06/2014   CLINICAL DATA:  Tachycardia and chest pain  EXAM: CHEST  2 VIEW  COMPARISON:  November 17, 2013  FINDINGS: There is no edema or consolidation. The heart size and pulmonary vascularity are normal. No adenopathy. There is pectus excavatum. There is mild anterior wedging of a mid thoracic vertebral body.  IMPRESSION: No edema or consolidation. Mild anterior wedging mid thoracic vertebral body, stable. Pectus excavatum.   Electronically Signed   By: Lowella Grip M.D.   On: 08/06/2014 14:26     EKG Interpretation   Date/Time:  Wednesday August 06 2014 13:39:12 EDT Ventricular Rate:  103 PR Interval:  142 QRS Duration: 96 QT Interval:  330 QTC Calculation: 432 R Axis:   -108 Text Interpretation:  Sinus tachycardia Biatrial enlargement Right  superior axis Right ventricular hypertrophy Since last tracing 13 Nov 2013  HEART RATE has decreased Confirmed by KNAPP  MD-I, IVA ([id number]) on  08/06/2014 2:06:01 PM      MDM   Final diagnoses:  SOB (shortness of breath)  Other chest pain  Anxiety    2:59 PM 28 y.o. male who pw sudden onset chest pressure, dizziness, sob, numbness in fingertips/neck/legs. Lasted approx 15 min. Currently asx. Pt was driving to pick up his girlfriend at the time. AFVSS here. Hx of anxiety. No FH of heart dis. He is a smoker. Low risk [place] and perc neg. Has been seen for similar sx per pt. Is under increased stress recently.    3:11 PM: Pt asx. Sx likely related to anxiety.  I have discussed the diagnosis/risks/treatment options with the patient and believe the pt to be eligible for discharge home to follow-up with [facility]. We also discussed returning to the ED immediately if new or worsening sx occur. We discussed the sx which are most concerning (e.g., return of sx, cp, sob, worsening anxiety) that necessitate immediate return. Medications administered to the patient during their visit and any new prescriptions provided to the patient are listed below.  Medications given during this visit Medications - No data to display  New Prescriptions   No medications on file       Pamella Pert, MD 08/06/14 1512

## 2014-10-27 ENCOUNTER — Encounter (HOSPITAL_COMMUNITY): Payer: Self-pay | Admitting: Emergency Medicine

## 2014-10-27 ENCOUNTER — Emergency Department (HOSPITAL_COMMUNITY): Payer: Self-pay

## 2014-10-27 ENCOUNTER — Emergency Department (HOSPITAL_COMMUNITY)
Admission: EM | Admit: 2014-10-27 | Discharge: 2014-10-28 | Disposition: A | Discharge status: Home / Self Care | Payer: Self-pay | Attending: Emergency Medicine | Admitting: Emergency Medicine

## 2014-10-27 DIAGNOSIS — Z79899 Other long term (current) drug therapy: Secondary | ICD-10-CM | POA: Insufficient documentation

## 2014-10-27 DIAGNOSIS — K219 Gastro-esophageal reflux disease without esophagitis: Secondary | ICD-10-CM | POA: Insufficient documentation

## 2014-10-27 DIAGNOSIS — F419 Anxiety disorder, unspecified: Secondary | ICD-10-CM | POA: Insufficient documentation

## 2014-10-27 DIAGNOSIS — R1013 Epigastric pain: Secondary | ICD-10-CM | POA: Insufficient documentation

## 2014-10-27 DIAGNOSIS — Z720 Tobacco use: Secondary | ICD-10-CM | POA: Insufficient documentation

## 2014-10-27 DIAGNOSIS — F329 Major depressive disorder, single episode, unspecified: Secondary | ICD-10-CM | POA: Insufficient documentation

## 2014-10-27 LAB — CBC
HEMATOCRIT: 45.3 % (ref 39.0–52.0)
HEMOGLOBIN: 16.1 g/dL (ref 13.0–17.0)
MCH: 34.1 pg — ABNORMAL HIGH (ref 26.0–34.0)
MCHC: 35.5 g/dL (ref 30.0–36.0)
MCV: 96 fL (ref 78.0–100.0)
Platelets: 249 10*3/uL (ref 150–400)
RBC: 4.72 MIL/uL (ref 4.22–5.81)
RDW: 12.1 % (ref 11.5–15.5)
WBC: 8.6 10*3/uL (ref 4.0–10.5)

## 2014-10-27 LAB — I-STAT TROPONIN, ED: Troponin i, poc: 0.01 ng/mL (ref 0.00–0.08)

## 2014-10-27 MED ORDER — ASPIRIN 325 MG PO TABS
325.0000 mg | ORAL_TABLET | ORAL | Status: AC
  Administered 2014-10-27: 325 mg via ORAL
  Filled 2014-10-27: qty 1

## 2014-10-27 NOTE — ED Notes (Signed)
Pt states he has chest pain in the middle of his chest that radiates to his left chest and to his back  Pt describes the pain as if someone is stepping on his chest from the inside  Pt states he has had dizziness and sweating associated with the pain  Pt states he has taken zantac, tums, milk for the pain without relief

## 2014-10-28 MED ORDER — GI COCKTAIL ~~LOC~~
30.0000 mL | Freq: Once | ORAL | Status: AC
  Administered 2014-10-28: 30 mL via ORAL
  Filled 2014-10-28: qty 30

## 2014-10-28 MED ORDER — OMEPRAZOLE 20 MG PO CPDR: 20.0000 mg | DELAYED_RELEASE_CAPSULE | Freq: Every day | ORAL | Status: DC

## 2014-10-28 NOTE — ED Notes (Signed)
Pt c/o persistent chest pain but was asleep when RN went into room.

## 2014-10-28 NOTE — ED Provider Notes (Signed)
CSN: [id number]     Arrival date & time 10/27/14  2220 History   First MD Initiated Contact with Patient 10/28/14 0022     Chief Complaint  Patient presents with  . Chest Pain     (Consider location/radiation/quality/duration/timing/severity/associated sxs/prior Treatment) Patient is a 28 y.o. male presenting with chest pain. The history is provided by the patient. No language interpreter was used.  Chest Pain Pain location:  Epigastric and substernal area Pain quality: burning and sharp   Pain radiates to the back: yes   Duration:  3 days Associated symptoms: abdominal pain   Associated symptoms: no fever, no nausea, no shortness of breath and not vomiting   Associated symptoms comment:  Pain as described that started 3 days ago after a night of heavy alcohol use. No vomiting or nausea. No cough or fever. He states he does not feel it is associated with food because it is there if he eats or when he doesn't. He took Zantac yesterday without relief.    Past Medical History  Diagnosis Date  . GERD (gastroesophageal reflux disease)   . Depression   . Anxiety    History reviewed. No pertinent past surgical history. Family History  Problem Relation Age of Onset  . Hypertension Other   . Diabetes Other    History  Substance Use Topics  . Smoking status: Current Every Day Smoker -- 1.00 packs/day    Types: Cigarettes  . Smokeless tobacco: Not on file  . Alcohol Use: Yes     Comment: 1 beer per week    Review of Systems  Constitutional: Negative for fever and chills.  HENT: Negative.   Respiratory: Negative.  Negative for shortness of breath.   Cardiovascular: Positive for chest pain.  Gastrointestinal: Positive for abdominal pain. Negative for nausea and vomiting.  Musculoskeletal: Negative.   Skin: Negative.   Neurological: Negative.       Allergies  Review of patient's allergies indicates no known allergies.  Home Medications   Prior to Admission medications    Medication Sig Start Date End Date Taking? Authorizing Provider  acetaminophen (TYLENOL) 500 MG tablet Take 1,000 mg by mouth every 6 (six) hours as needed for mild pain.   Yes Historical Provider, MD  busPIRone (BUSPAR) 15 MG tablet Take 15 mg by mouth 3 (three) times daily.   Yes Historical Provider, MD  calcium carbonate (TUMS EX) 750 MG chewable tablet Chew 2-3 tablets by mouth once as needed for heartburn.    Yes Historical Provider, MD  mirtazapine (REMERON) 15 MG tablet Take 15 mg by mouth at bedtime.   Yes Historical Provider, MD  ranitidine (ZANTAC) 150 MG tablet Take 150 mg by mouth 2 (two) times daily as needed for heartburn.   Yes Historical Provider, MD   BP 133/84 mmHg  Pulse 83  Temp(Src) 98.1 F (36.7 C) (Oral)  Resp 23  Ht 5\' 8"  (1.727 m)  Wt 140 lb (63.504 kg)  BMI 21.29 kg/m2  SpO2 99% Physical Exam  Constitutional: He is oriented to person, place, and time. He appears well-developed and well-nourished.  HENT:  Head: Normocephalic.  Neck: Normal range of motion. Neck supple.  Cardiovascular: Normal rate and regular rhythm.   Pulmonary/Chest: Effort normal and breath sounds normal. He has no wheezes. He has no rales. He exhibits no tenderness.  Abdominal: Soft. Bowel sounds are normal. There is no tenderness. There is no rebound and no guarding.  Musculoskeletal: Normal range of motion.  Neurological: He is  alert and oriented to person, place, and time.  Skin: Skin is warm and dry. No rash noted.  Psychiatric: He has a normal mood and affect.    ED Course  Procedures (including critical care time) Labs Review Labs Reviewed  CBC - Abnormal; Notable for the following:    MCH 34.1 (*)    All other components within normal limits  I-STAT TROPOININ, ED    Imaging Review Dg Chest Port 1 View  10/27/2014   CLINICAL DATA:  Chest pain  EXAM: PORTABLE CHEST - 1 VIEW  COMPARISON:  08/06/2014  FINDINGS: The heart size and mediastinal contours are within normal  limits. Both lungs are clear. The visualized skeletal structures are unremarkable.  IMPRESSION: No active disease.   Electronically Signed   By: Inez Catalina M.D.   On: 10/27/2014 23:55     EKG Interpretation None      MDM   Final diagnoses:  None    1. Dyspepsia  Better with GI cocktail. Labs and imaging reassuring. EKG non-acute. Symptoms follow pattern of GI source rather than ACS. Stable for discharge home. Will provide PCP follow up.    Dewaine Oats, PA-C 10/28/14 0210  Julianne Rice, MD 10/28/14 [phone]

## 2014-10-28 NOTE — Discharge Instructions (Signed)
Indigestion °Indigestion is discomfort in the upper abdomen that is caused by underlying problems such as gastroesophageal reflux disease (GERD), ulcers, or gallbladder problems.  °CAUSES  °Indigestion can be caused by many things. Possible causes include: °· Stomach acid in the esophagus. °· Stomach infections, usually caused by the bacteria H. pylori. °· Being overweight. °· Hiatal hernia. This means part of the stomach pushes up through the diaphragm. °· Overeating. °· Emotional problems, such as stress, anxiety, or depression. °· Poor nutrition. °· Consuming too much alcohol, tobacco, or caffeine. °· Consuming spicy foods, fats, peppermint, chocolate, tomato products, citrus, or fruit juices. °· Medicines such as aspirin and other anti-inflammatory drugs, hormones, steroids, and thyroid medicines. °· Gastroparesis. This is a condition in which the stomach does not empty properly. °· Stomach cancer. °· Pregnancy, due to an increase in hormone levels, a relaxation of muscles in the digestive tract, and pressure on the stomach from the growing fetus. °SYMPTOMS  °· Uncomfortable feeling of fullness after eating. °· Pain or burning sensation in the upper abdomen. °· Bloating. °· Belching and gas. °· Nausea and vomiting. °· Acidic taste in the mouth. °· Burning sensation in the chest (heartburn). °DIAGNOSIS  °Your caregiver will review your medical history and perform a physical exam. Other tests, such as blood tests, stool tests, X-rays, and other imaging scans, may be done to check for more serious problems. °TREATMENT  °Liquid antacids and other drugs may be given to block stomach acid secretion. Medicines that increase esophageal muscle tone may also be given to help reduce symptoms. If an infection is found, antibiotic medicine may be given. °HOME CARE INSTRUCTIONS °· Avoid foods and drinks that make your symptoms worse, such as: °¨ Caffeine or alcoholic drinks. °¨ Chocolate. °¨ Peppermint or mint  flavorings. °¨ Garlic and onions. °¨ Spicy foods. °¨ Citrus fruits, such as oranges, lemons, or limes. °¨ Tomato-based foods such as sauce, chili, salsa, and pizza. °¨ Fried and fatty foods. °· Avoid eating for the 3 hours prior to your bedtime. °· Eat small, frequent meals instead of large meals. °· Stop smoking if you smoke. °· Maintain a healthy weight. °· Wear loose-fitting clothing. Do not wear anything tight around your waist that causes pressure on your stomach. °· Raise the head of your bed 4 to 8 inches with wood blocks to help you sleep. Extra pillows will not help. °· Only take over-the-counter or prescription medicines as directed by your caregiver. °· Do not take aspirin, ibuprofen, or other nonsteroidal anti-inflammatory drugs (NSAIDs). °SEEK IMMEDIATE MEDICAL CARE IF:  °· You are not better after 2 days. °· You have chest pressure or pain that radiates up into your neck, arms, back, jaw, or upper abdomen. °· You have difficulty swallowing. °· You keep vomiting. °· You have black or bloody stools. °· You have a fever. °· You have dizziness, fainting, difficulty breathing, or heavy sweating. °· You have severe abdominal pain. °· You lose weight without trying. °MAKE SURE YOU: °· Understand these instructions. °· Will watch your condition. °· Will get help right away if you are not doing well or get worse. °Document Released: 01/05/2005 Document Revised: 02/20/2012 Document Reviewed: 07/13/2011 °[employer]® Patient Information ©2015 [employer]. This information is not intended to replace advice given to you by your health care provider. Make sure you discuss any questions you have with your health care provider. ° °

## 2014-12-12 DIAGNOSIS — A549 Gonococcal infection, unspecified: Secondary | ICD-10-CM

## 2014-12-12 HISTORY — DX: Gonococcal infection, unspecified: A54.9

## 2015-01-17 ENCOUNTER — Encounter (HOSPITAL_COMMUNITY): Payer: Self-pay | Admitting: Physical Medicine and Rehabilitation

## 2015-01-17 ENCOUNTER — Emergency Department (HOSPITAL_COMMUNITY)
Admission: EM | Admit: 2015-01-17 | Discharge: 2015-01-17 | Disposition: A | Discharge status: Home / Self Care | Payer: Self-pay | Attending: Emergency Medicine | Admitting: Emergency Medicine

## 2015-01-17 ENCOUNTER — Emergency Department (HOSPITAL_COMMUNITY): Payer: Self-pay

## 2015-01-17 DIAGNOSIS — E860 Dehydration: Secondary | ICD-10-CM | POA: Insufficient documentation

## 2015-01-17 DIAGNOSIS — F329 Major depressive disorder, single episode, unspecified: Secondary | ICD-10-CM | POA: Insufficient documentation

## 2015-01-17 DIAGNOSIS — R1013 Epigastric pain: Secondary | ICD-10-CM | POA: Insufficient documentation

## 2015-01-17 DIAGNOSIS — M791 Myalgia, unspecified site: Secondary | ICD-10-CM

## 2015-01-17 DIAGNOSIS — Z79899 Other long term (current) drug therapy: Secondary | ICD-10-CM | POA: Insufficient documentation

## 2015-01-17 DIAGNOSIS — D419 Neoplasm of uncertain behavior of unspecified urinary organ: Secondary | ICD-10-CM | POA: Insufficient documentation

## 2015-01-17 DIAGNOSIS — Z720 Tobacco use: Secondary | ICD-10-CM | POA: Insufficient documentation

## 2015-01-17 DIAGNOSIS — K219 Gastro-esophageal reflux disease without esophagitis: Secondary | ICD-10-CM | POA: Insufficient documentation

## 2015-01-17 LAB — COMPREHENSIVE METABOLIC PANEL
ALK PHOS: 46 U/L (ref 39–117)
ALT: 30 U/L (ref 0–53)
AST: 30 U/L (ref 0–37)
Albumin: 5.2 g/dL (ref 3.5–5.2)
Anion gap: 13 (ref 5–15)
BUN: 12 mg/dL (ref 6–23)
CO2: 23 mmol/L (ref 19–32)
Calcium: 9.6 mg/dL (ref 8.4–10.5)
Chloride: 102 mmol/L (ref 96–112)
Creatinine, Ser: 0.99 mg/dL (ref 0.50–1.35)
GFR calc non Af Amer: >90 mL/min (ref >90)
Glucose, Bld: 106 mg/dL — ABNORMAL HIGH (ref 70–99)
POTASSIUM: 3.3 mmol/L — AB (ref 3.5–5.1)
Sodium: 138 mmol/L (ref 135–145)
TOTAL PROTEIN: 7.7 g/dL (ref 6.0–8.3)
Total Bilirubin: 1.4 mg/dL — ABNORMAL HIGH (ref 0.3–1.2)

## 2015-01-17 LAB — CBC WITH DIFFERENTIAL/PLATELET
Basophils Absolute: 0 K/uL (ref 0.0–0.1)
Basophils Relative: 0 % (ref 0–1)
Eosinophils Absolute: 0 K/uL (ref 0.0–0.7)
Eosinophils Relative: 0 % (ref 0–5)
HCT: 48 % (ref 39.0–52.0)
Hemoglobin: 17.2 g/dL — ABNORMAL HIGH (ref 13.0–17.0)
Lymphocytes Relative: 24 % (ref 12–46)
Lymphs Abs: 3.6 K/uL (ref 0.7–4.0)
MCH: 33.5 pg (ref 26.0–34.0)
MCHC: 35.8 g/dL (ref 30.0–36.0)
MCV: 93.4 fL (ref 78.0–100.0)
Monocytes Absolute: 1.6 K/uL — ABNORMAL HIGH (ref 0.1–1.0)
Monocytes Relative: 11 % (ref 3–12)
Neutro Abs: 9.5 K/uL — ABNORMAL HIGH (ref 1.7–7.7)
Neutrophils Relative %: 65 % (ref 43–77)
Platelets: 284 K/uL (ref 150–400)
RBC: 5.14 MIL/uL (ref 4.22–5.81)
RDW: 12.5 % (ref 11.5–15.5)
WBC: 14.8 K/uL — ABNORMAL HIGH (ref 4.0–10.5)

## 2015-01-17 LAB — I-STAT TROPONIN, ED: TROPONIN I, POC: 0 ng/mL (ref 0.00–0.08)

## 2015-01-17 MED ORDER — SODIUM CHLORIDE 0.9 % IV BOLUS (SEPSIS)
1000.0000 mL | Freq: Once | INTRAVENOUS | Status: AC
  Administered 2015-01-17: 1000 mL via INTRAVENOUS

## 2015-01-17 NOTE — ED Provider Notes (Signed)
CSN: [id number]     Arrival date & time 01/17/15  1719 History   First MD Initiated Contact with Patient 01/17/15 1858     Chief Complaint  Patient presents with  . Chest Pain     (Consider location/radiation/quality/duration/timing/severity/associated sxs/prior Treatment) HPI Comments: Patient is a 29 year old male with a past medical history of alcohol abuse and GERD who presents with epigastric pain that radiates into his chest that started this morning. Symptoms started gradually and remained constant since the onset. The pain is cramping and moderate. No aggravating/alleviating factors. Patient reports associated myalgias. Patient reports drinking 1 fifth of liquor last night. Patient thinks he is severely dehydrated.    Past Medical History  Diagnosis Date  . GERD (gastroesophageal reflux disease)   . Depression   . Anxiety    History reviewed. No pertinent past surgical history. Family History  Problem Relation Age of Onset  . Hypertension Other   . Diabetes Other    History  Substance Use Topics  . Smoking status: Current Every Day Smoker -- 1.00 packs/day    Types: Cigarettes  . Smokeless tobacco: Not on file  . Alcohol Use: Yes     Comment: 1 beer per week    Review of Systems  Constitutional: Negative for fever, chills and fatigue.  HENT: Negative for trouble swallowing.   Eyes: Negative for visual disturbance.  Respiratory: Negative for shortness of breath.   Cardiovascular: Positive for chest pain. Negative for palpitations.  Gastrointestinal: Positive for abdominal pain. Negative for nausea, vomiting and diarrhea.  Genitourinary: Negative for dysuria and difficulty urinating.  Musculoskeletal: Positive for myalgias. Negative for arthralgias and neck pain.  Skin: Negative for color change.  Neurological: Negative for dizziness and weakness.  Psychiatric/Behavioral: Negative for dysphoric mood.      Allergies  Review of patient's allergies indicates no  known allergies.  Home Medications   Prior to Admission medications   Medication Sig Start Date End Date Taking? Authorizing Provider  acetaminophen (TYLENOL) 500 MG tablet Take 1,000 mg by mouth every 6 (six) hours as needed for mild pain.    Historical Provider, MD  busPIRone (BUSPAR) 15 MG tablet Take 15 mg by mouth 3 (three) times daily.    Historical Provider, MD  calcium carbonate (TUMS EX) 750 MG chewable tablet Chew 2-3 tablets by mouth once as needed for heartburn.     Historical Provider, MD  mirtazapine (REMERON) 15 MG tablet Take 15 mg by mouth at bedtime.    Historical Provider, MD  omeprazole (PRILOSEC) 20 MG capsule Take 1 capsule (20 mg total) by mouth daily. Take one twice daily for the first 7 days then once daily after that 10/28/14   Nehemiah Settle A Upstill, PA-C  ranitidine (ZANTAC) 150 MG tablet Take 150 mg by mouth 2 (two) times daily as needed for heartburn.    Historical Provider, MD   BP 135/88 mmHg  Pulse 139  Resp 19  SpO2 99% Physical Exam  Constitutional: He is oriented to person, place, and time. He appears well-developed and well-nourished. No distress.  HENT:  Head: Normocephalic and atraumatic.  Eyes: Conjunctivae and EOM are normal.  Neck: Normal range of motion.  Cardiovascular: Normal rate and regular rhythm.  Exam reveals no gallop and no friction rub.   No murmur heard. Pulmonary/Chest: Effort normal and breath sounds normal. He has no wheezes. He has no rales. He exhibits no tenderness.  Abdominal: Soft. He exhibits no distension. There is no tenderness. There is  no rebound.  Musculoskeletal: Normal range of motion.  Neurological: He is alert and oriented to person, place, and time. Coordination normal.  Speech is goal-oriented. Moves limbs without ataxia.   Skin: Skin is warm and dry.  Psychiatric: He has a normal mood and affect. His behavior is normal.  Nursing note and vitals reviewed.   ED Course  Procedures (including critical care time) Labs  Review Labs Reviewed  CBC WITH DIFFERENTIAL/PLATELET - Abnormal; Notable for the following:    WBC 14.8 (*)    Hemoglobin 17.2 (*)    Neutro Abs 9.5 (*)    Monocytes Absolute 1.6 (*)    All other components within normal limits  COMPREHENSIVE METABOLIC PANEL - Abnormal; Notable for the following:    Potassium 3.3 (*)    Glucose, Bld 106 (*)    Total Bilirubin 1.4 (*)    All other components within normal limits  I-STAT TROPOININ, ED    Imaging Review Dg Chest 2 View  01/17/2015   CLINICAL DATA:  Left-sided chest pain, shortness of breath  EXAM: CHEST  2 VIEW  COMPARISON:  10/27/2014, 08/06/2014  FINDINGS: The heart size and mediastinal contours are within normal limits. Both lungs are clear. The visualized skeletal structures are unremarkable. Pectus excavatum deformity reidentified.  IMPRESSION: No active cardiopulmonary disease.   Electronically Signed   By: Conchita Paris M.D.   On: 01/17/2015 18:14     EKG Interpretation   Date/Time:  Saturday January 17 2015 17:29:07 EST Ventricular Rate:  132 PR Interval:  144 QRS Duration: 94 QT Interval:  370 QTC Calculation: 548 R Axis:   179 Text Interpretation:  Sinus tachycardia Right axis deviation Incomplete  right bundle branch block Possible Right ventricular hypertrophy  Nonspecific T wave abnormality Abnormal ECG agree. no significant change  from old except rate related. Confirmed by Johnney Killian, MD, Jeannie Done [phone]) on  01/17/2015 5:34:28 PM      MDM   Final diagnoses:  Dehydration  Myalgia  Epigastric abdominal pain    7:24 PM Labs, chest xray, and EKG unremarkable for acute changes. Patient's pulse is normal at this time despite initial tachycardia at 139. Patient will have IV hydration and will be discharged. Patient is young without serious health concerns. I doubt life-threatening etiology of symptoms. No further evaluation needed at this time.     Alvina Chou, PA-C 01/17/15 2129  Charlesetta Shanks,  MD 01/17/15 [phone]

## 2015-01-17 NOTE — ED Notes (Signed)
Pt presents to department for evaluation of diffuse chest pain and epigastric pain. Admits to heavy ETOH use last night. Now reports cramping all over body. 5/10 diffuse chest and epigastric pain. Pt is alert and oriented x4.

## 2015-01-17 NOTE — Discharge Instructions (Signed)
Drink plenty of fluids to maintain hydration. Refer to attached documents for more information. Return to the ED with worsening or concerning symptoms.

## 2015-03-10 ENCOUNTER — Emergency Department (HOSPITAL_COMMUNITY): Payer: Self-pay

## 2015-03-10 ENCOUNTER — Emergency Department (HOSPITAL_COMMUNITY)
Admission: EM | Admit: 2015-03-10 | Discharge: 2015-03-10 | Disposition: A | Discharge status: Home / Self Care | Payer: Self-pay | Attending: Emergency Medicine | Admitting: Emergency Medicine

## 2015-03-10 ENCOUNTER — Encounter (HOSPITAL_COMMUNITY): Payer: Self-pay | Admitting: *Deleted

## 2015-03-10 DIAGNOSIS — F419 Anxiety disorder, unspecified: Secondary | ICD-10-CM | POA: Insufficient documentation

## 2015-03-10 DIAGNOSIS — Z79899 Other long term (current) drug therapy: Secondary | ICD-10-CM | POA: Insufficient documentation

## 2015-03-10 DIAGNOSIS — R079 Chest pain, unspecified: Secondary | ICD-10-CM | POA: Insufficient documentation

## 2015-03-10 DIAGNOSIS — F329 Major depressive disorder, single episode, unspecified: Secondary | ICD-10-CM | POA: Insufficient documentation

## 2015-03-10 DIAGNOSIS — K219 Gastro-esophageal reflux disease without esophagitis: Secondary | ICD-10-CM | POA: Insufficient documentation

## 2015-03-10 DIAGNOSIS — R0602 Shortness of breath: Secondary | ICD-10-CM

## 2015-03-10 DIAGNOSIS — Z720 Tobacco use: Secondary | ICD-10-CM | POA: Insufficient documentation

## 2015-03-10 LAB — BASIC METABOLIC PANEL
Anion gap: 13 (ref 5–15)
BUN: 6 mg/dL (ref 6–23)
CALCIUM: 9.4 mg/dL (ref 8.4–10.5)
CO2: 24 mmol/L (ref 19–32)
Chloride: 100 mmol/L (ref 96–112)
Creatinine, Ser: 0.72 mg/dL (ref 0.50–1.35)
GFR calc Af Amer: >90 mL/min (ref >90)
GFR calc non Af Amer: >90 mL/min (ref >90)
Glucose, Bld: 92 mg/dL (ref 70–99)
Potassium: 3.4 mmol/L — ABNORMAL LOW (ref 3.5–5.1)
Sodium: 137 mmol/L (ref 135–145)

## 2015-03-10 LAB — CBC
HEMATOCRIT: 46.5 % (ref 39.0–52.0)
HEMOGLOBIN: 16.1 g/dL (ref 13.0–17.0)
MCH: 33.3 pg (ref 26.0–34.0)
MCHC: 34.6 g/dL (ref 30.0–36.0)
MCV: 96.3 fL (ref 78.0–100.0)
PLATELETS: 260 10*3/uL (ref 150–400)
RBC: 4.83 MIL/uL (ref 4.22–5.81)
RDW: 13 % (ref 11.5–15.5)
WBC: 12.2 10*3/uL — ABNORMAL HIGH (ref 4.0–10.5)

## 2015-03-10 MED ORDER — SODIUM CHLORIDE 0.9 % IV BOLUS (SEPSIS)
1000.0000 mL | Freq: Once | INTRAVENOUS | Status: AC
  Administered 2015-03-10: 1000 mL via INTRAVENOUS

## 2015-03-10 MED ORDER — LORAZEPAM 1 MG PO TABS
1.0000 mg | ORAL_TABLET | Freq: Once | ORAL | Status: AC
  Administered 2015-03-10: 1 mg via ORAL
  Filled 2015-03-10: qty 1
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
## 2015-03-10 NOTE — ED Notes (Signed)
Pt is in stable condition upon d/c and ambulates from ED. 

## 2015-03-10 NOTE — ED Notes (Signed)
Patient transported to X-ray 

## 2015-03-10 NOTE — Discharge Instructions (Signed)
Return to the ED with any concerns including difficulty breathing, fainting, decreased level of alertness/lethargy, or any other alarming symptoms

## 2015-03-14 ENCOUNTER — Emergency Department (HOSPITAL_COMMUNITY): Payer: Self-pay

## 2015-03-14 ENCOUNTER — Emergency Department (HOSPITAL_COMMUNITY)
Admission: EM | Admit: 2015-03-14 | Discharge: 2015-03-14 | Disposition: A | Discharge status: Home / Self Care | Payer: Self-pay | Attending: Emergency Medicine | Admitting: Emergency Medicine

## 2015-03-14 ENCOUNTER — Encounter (HOSPITAL_COMMUNITY): Payer: Self-pay

## 2015-03-14 DIAGNOSIS — W2201XA Walked into wall, initial encounter: Secondary | ICD-10-CM | POA: Insufficient documentation

## 2015-03-14 DIAGNOSIS — F419 Anxiety disorder, unspecified: Secondary | ICD-10-CM | POA: Insufficient documentation

## 2015-03-14 DIAGNOSIS — R079 Chest pain, unspecified: Secondary | ICD-10-CM | POA: Insufficient documentation

## 2015-03-14 DIAGNOSIS — K219 Gastro-esophageal reflux disease without esophagitis: Secondary | ICD-10-CM | POA: Insufficient documentation

## 2015-03-14 DIAGNOSIS — S62316A Displaced fracture of base of fifth metacarpal bone, right hand, initial encounter for closed fracture: Secondary | ICD-10-CM | POA: Insufficient documentation

## 2015-03-14 DIAGNOSIS — Y998 Other external cause status: Secondary | ICD-10-CM | POA: Insufficient documentation

## 2015-03-14 DIAGNOSIS — Y9289 Other specified places as the place of occurrence of the external cause: Secondary | ICD-10-CM | POA: Insufficient documentation

## 2015-03-14 DIAGNOSIS — Y9389 Activity, other specified: Secondary | ICD-10-CM | POA: Insufficient documentation

## 2015-03-14 DIAGNOSIS — Z720 Tobacco use: Secondary | ICD-10-CM | POA: Insufficient documentation

## 2015-03-14 DIAGNOSIS — S62306A Unspecified fracture of fifth metacarpal bone, right hand, initial encounter for closed fracture: Secondary | ICD-10-CM

## 2015-03-14 DIAGNOSIS — R000 Tachycardia, unspecified: Secondary | ICD-10-CM | POA: Insufficient documentation

## 2015-03-14 DIAGNOSIS — S62314A Displaced fracture of base of fourth metacarpal bone, right hand, initial encounter for closed fracture: Secondary | ICD-10-CM | POA: Insufficient documentation

## 2015-03-14 DIAGNOSIS — F1410 Cocaine abuse, uncomplicated: Secondary | ICD-10-CM | POA: Insufficient documentation

## 2015-03-14 DIAGNOSIS — F1010 Alcohol abuse, uncomplicated: Secondary | ICD-10-CM | POA: Insufficient documentation

## 2015-03-14 DIAGNOSIS — Z79899 Other long term (current) drug therapy: Secondary | ICD-10-CM | POA: Insufficient documentation

## 2015-03-14 LAB — COMPREHENSIVE METABOLIC PANEL
ALK PHOS: 62 U/L (ref 39–117)
ALT: 30 U/L (ref 0–53)
ANION GAP: 13 (ref 5–15)
AST: 30 U/L (ref 0–37)
Albumin: 4.5 g/dL (ref 3.5–5.2)
BUN: 6 mg/dL (ref 6–23)
CO2: 21 mmol/L (ref 19–32)
Calcium: 8.9 mg/dL (ref 8.4–10.5)
Chloride: 101 mmol/L (ref 96–112)
Creatinine, Ser: 0.73 mg/dL (ref 0.50–1.35)
GFR calc non Af Amer: >90 mL/min (ref >90)
GLUCOSE: 82 mg/dL (ref 70–99)
Potassium: 3.4 mmol/L — ABNORMAL LOW (ref 3.5–5.1)
Sodium: 135 mmol/L (ref 135–145)
Total Bilirubin: 0.8 mg/dL (ref 0.3–1.2)
Total Protein: 7.6 g/dL (ref 6.0–8.3)

## 2015-03-14 LAB — CBC
HCT: 48.2 % (ref 39.0–52.0)
Hemoglobin: 16.9 g/dL (ref 13.0–17.0)
MCH: 33.8 pg (ref 26.0–34.0)
MCHC: 35.1 g/dL (ref 30.0–36.0)
MCV: 96.4 fL (ref 78.0–100.0)
PLATELETS: 235 10*3/uL (ref 150–400)
RBC: 5 MIL/uL (ref 4.22–5.81)
RDW: 13.1 % (ref 11.5–15.5)
WBC: 9.9 10*3/uL (ref 4.0–10.5)

## 2015-03-14 LAB — RAPID URINE DRUG SCREEN, HOSP PERFORMED
AMPHETAMINES: NOT DETECTED
BARBITURATES: NOT DETECTED
BENZODIAZEPINES: NOT DETECTED
Cocaine: POSITIVE — AB
OPIATES: NOT DETECTED
TETRAHYDROCANNABINOL: NOT DETECTED

## 2015-03-14 LAB — TROPONIN I

## 2015-03-14 LAB — ACETAMINOPHEN LEVEL: Acetaminophen (Tylenol), Serum: <10 ug/mL — ABNORMAL LOW (ref 10–30)

## 2015-03-14 LAB — SALICYLATE LEVEL: Salicylate Lvl: <4 mg/dL (ref 2.8–20.0)

## 2015-03-14 LAB — ETHANOL: ALCOHOL ETHYL (B): 28 mg/dL — AB (ref 0–9)

## 2015-03-14 LAB — LIPASE, BLOOD: Lipase: 29 U/L (ref 11–59)

## 2015-03-14 MED ORDER — GI COCKTAIL ~~LOC~~
30.0000 mL | Freq: Once | ORAL | Status: AC
  Administered 2015-03-14: 30 mL via ORAL
  Filled 2015-03-14: qty 30

## 2015-03-14 NOTE — Discharge Instructions (Signed)
1. Medications: usual home medications 2. Treatment: rest, drink plenty of fluids, keep cast clean and dry 3. Follow Up: Please followup with your hand orthopedics in 3 days for discussion of your diagnoses and further evaluation after today's visit; if you do not have a primary care doctor use the resource guide provided to find one; Please return to the ER for worsening symptoms, suicidal ideation, seizures or other concerns    Emergency Department Resource Guide 1) Find a Doctor and Pay Out of Pocket Although you won't have to find out who is covered by your insurance plan, it is a good idea to ask around and get recommendations. You will then need to call the office and see if the doctor you have chosen will accept you as a new patient and what types of options they offer for patients who are self-pay. Some doctors offer discounts or will set up payment plans for their patients who do not have insurance, but you will need to ask so you aren't surprised when you get to your appointment.  2) Contact Your Local Health Department Not all health departments have doctors that can see patients for sick visits, but many do, so it is worth a call to see if yours does. If you don't know where your local health department is, you can check in your phone book. The CDC also has a tool to help you locate your state's health department, and many state websites also have listings of all of their local health departments.  3) Find a [facility] If your illness is not likely to be very severe or complicated, you may want to try a walk in clinic. These are popping up all over the country in pharmacies, drugstores, and shopping centers. They're usually staffed by nurse practitioners or physician assistants that have been trained to treat common illnesses and complaints. They're usually fairly quick and inexpensive. However, if you have serious medical issues or chronic medical problems, these are probably not your  best option.  No Primary Care Doctor: - Call Health Connect at  [phone] - they can help you locate a primary care doctor that  accepts your insurance, provides certain services, etc. - Physician Referral Service- [phone]  Chronic Pain Problems: Organization         Address  Phone   Notes  [facility]  [phone] Patients need to be referred by their primary care doctor.   Medication Assistance: Organization         Address  Phone   Notes  [facility] Medication [facility] [address] [place], [place] [address] [phone] --Must be a resident of [facility] -- Must have NO insurance coverage whatsoever (no Medicaid/ Medicare, etc.) -- The pt. MUST have a primary care doctor that directs their care regularly and follows them in the community   [facility]ist  [phone]   [employer]  [phone]    Agencies that provide inexpensive medical care: Organization         Address  Phone   Notes  [place]  [phone]   [facility]    [phone]   [facility] [place], [place] [address] [phone]   [place] [address], [place] [phone]   Planned Parenthood    [phone]   [facility]    [phone]   Community Health  and [address], [place] Phone:  [phone], Fax:  [phone] Hours of Operation:  9 am - 6 pm, M-F.  Also accepts Medicaid/Medicare and self-pay.  [facility] for [place] [place], [address], [place] Phone: [phone], Fax: [phone]. Hours of Operation:  8:30 am - 5:30 pm, M-F.  Also accepts Medicaid and self-pay.  [facility] [place] [address], [place] Phone: [phone]   [place], [place], [place] [phone], Ext. [phone] Mondays & Thursdays: 7-9 AM.  First 15  patients are seen on a first come, first serve basis.    [place] Providers:  Organization         Address  Phone   Notes  [facility] [address], [place] [phone] Also accepts self-pay patients.  [facility] [id number] [place], [place]  [phone]   [address], [place] [phone]   [facility] [address] [place] [phone]   Lucianne Lei [address], [place]   [phone] Only accepts [place] Access [place] patients after they have their name applied to their card.   Self-Pay (no insurance) in [facility]:  Organization         Address  Phone   Notes  Sickle Cell Patients, [facility] [place] [phone]   [facility] [place] [phone]   [facility] [place]  [place], [place], [place] [phone]   [facility]/Dr. Osei-Bonsu  [address] [place] or [address], [place] [phone] Phone number for both [place] and [place] locations is the same.  [facility] [address] [place] [phone]   [facility] [address] [place] or [address] [phone]   [facility] [address] [place] [phone], phone; [phone], fax Sees patients 1st and 3rd Saturday of every month.  Must not qualify for public or private insurance (i.e. Medicaid, Medicare, [place] Health Choice, Veterans' Benefits)  Household income should be no more than 200% of the poverty level The clinic cannot treat you if you are pregnant or think you are pregnant  Sexually transmitted diseases are not treated at the clinic.    Dental  Care: Organization         Address  Phone  Notes  [facility] Department of [facility] [place] [phone] Accepts children up to age 36 who are enrolled in [place] or [place]; pregnant women with a Medicaid card; and children who have applied for Medicaid or [place] Health Choice, but were declined, whose parents can pay a reduced fee at time of service.  [facility] Department of [facility]  [address], [place] [phone] Accepts children up to age 37 who are enrolled in [place] or [place]; pregnant women with a Medicaid card; and children who have applied for Medicaid or [place] Health Choice, but were declined, whose parents can pay a reduced fee at time of service.  [facility]  [address]  Mardene Speak [phone] Patients are seen by appointment only. Walk-ins are not accepted. [place] will see patients 41 years of age and older. Monday - Tuesday (8am-5pm) Most Wednesdays (8:30-5pm) $30 per visit, cash only  [facility] Adult Dental Access PROGRAM  [address], [facility] [phone] Patients are seen by appointment only. Walk-ins are not accepted. [place] will see patients 39 years of age and older. One Wednesday Evening (Monthly: Volunteer Based).  $30 per visit, cash only  [place]  [phone] for adults; Children under age 55, call Graduate Pediatric Dentistry at [phone]. Children aged 51-14, please call [phone] to request a pediatric application.  Dental services are provided in all areas of dental care including fillings, crowns and bridges, complete and partial dentures, implants, gum treatment, root canals, and extractions. Preventive care is also provided. Treatment is provided to both adults and children. Patients are selected via a lottery and there is often a waiting list.   [facility] [address], [place]  [phone] [email or web address]   [facility] [address] [place], [place] [phone], Ext. [phone] Second and Fourth Thursday of each month, opens at 6:30 AM; Clinic ends at 9 AM.  Patients are seen on a first-come first-served basis, and a limited number are seen during each clinic.   [facility]  [address] Hillard Danker [place], [place] [phone]   Eligibility Requirements You must have lived in [place], [place], or [place] counties for at least the last three months.   You cannot be eligible for state or federal sponsored [employer], including [employer], [place], or [employer].   You generally cannot be eligible for healthcare insurance through your employer.    How to apply: Eligibility screenings are held every Tuesday and Wednesday afternoon from 1:00 pm until 4:00 pm. You do not need an appointment for the interview!  [facility] [address], [place], [place]   [place]  [place] Department  [place]  [phone]    Behavioral Health Resources in the Community: Intensive Outpatient Programs Organization         Address  Phone  Notes  [place]. [address], [place], [place] [phone]   [facility] Outpatient [address] [place], [place]   ADS: Alcohol & Drug Svcs [address], [place], [place]   [place] [address]  [place], [place] or [phone]   Substance Abuse Resources Organization         Address  Phone  Notes  Alcohol and Drug Services  [phone]   [place]  [phone]   [facility] [place]   Chinita Pester  [phone]   Residential & Outpatient Substance Abuse Program  [phone]    Psychological Services Organization         Address  Phone  Notes  [facility] [place]  [place]  [phone]   [place] [address] [place] or [phone]    Mobile Crisis Teams Organization         Address  Phone  Notes  [facility], Mobile Crisis Care Unit  [phone]   [facility]  [address] [place], [place]   [place]  [place] [phone]    Self-Help/Support Groups Organization         Address  Phone             Notes  Mental Health Assoc. of [place] - variety of support groups  [place] Call for more information  Narcotics Anonymous (NA), [facility] [address], [employer] [place]  2 meetings at this location   Special educational needs teacher         Address  Phone  Notes  [facility] [place],    [place]  [phone]   [facility]  [address], [place] [id number], [place], [place]   [place], [place] [phone] Admissions: 8am-3pm M-F  Incentives Substance [place] [address],    [place], [place] [phone]   [facility] [address] [place], [place], [place]   [facility] [address],  [place], [place]   [facility] - Intensive Outpatient [address] Kristeen [address], [place], [place]   [facility] ([place].) [place].,  [place], [place] [phone] or [phone]   [facility] (RTS) [address] [place], [place] Accepts Medicaid  [facility] [place] [address]  [place] [phone] Substance Abuse/Addiction Treatment   [facility] Organization         Address  Phone  Notes  [facility]  [phone]   Domenic Schwab, PhD [address] Arlis Porta [place], [place]   [phone] or [phone]   [facility] [place], [place] [phone]   [facility] [address] [place], [place] [phone] Insurance/Medicaid/sponsorship through [facility] and Families [address]                                    [place], [place] [phone] [place] [address], [place] [phone]    Dr. Adele Schilder  [phone]   [facility] Dept. 1) [address], [place] 2) [place] 3)  [place] [address], [place] [phone]  [phone]   [place] [phone] or [phone] (After Hours)

## 2015-03-14 NOTE — Progress Notes (Signed)
Orthopedic Tech Progress Note Patient Details:  Thomas Hunter [birth year] [id number]  Ortho Devices Type of Ortho Device: Ace wrap, Ulna gutter splint Ortho Device/Splint Location: RUE Ortho Device/Splint Interventions: Ordered   Braulio Bosch 03/14/2015, 10:11 PM

## 2015-03-14 NOTE — ED Notes (Signed)
Pt here for alcohol and drug detox.  Last used crack few hours ago.  Started using again 1 month ago, has used in the past.  Drinks liquor 1 pint every day.  Last drank alcohol few hours ago.

## 2015-03-14 NOTE — ED Notes (Signed)
Taken to xray department at this time.

## 2015-03-14 NOTE — ED Notes (Signed)
Pt hit right fist into wall 1 month still very painful, bruised. Wants evaluated.

## 2015-03-14 NOTE — ED Notes (Signed)
[place] PA at bedside.

## 2015-03-14 NOTE — ED Provider Notes (Signed)
CSN: [id number]     Arrival date & time 03/14/15  1754 History   First MD Initiated Contact with Patient 03/14/15 1845     Chief Complaint  Patient presents with  . Drug / Alcohol Assessment     (Consider location/radiation/quality/duration/timing/severity/associated sxs/prior Treatment) Patient is a 29 y.o. male presenting with drug/alcohol assessment. The history is provided by the patient, medical records and a relative. No language interpreter was used.  Drug / Alcohol Assessment Associated symptoms: no abdominal pain, no headaches, no nausea, no shortness of breath and no vomiting      Thomas Hunter is a 29 y.o. male  with a hx of depression, anxiety presents to the Emergency Department complaining of gradual, persistent, progressively worsening EtOH abuse onset age 84. Pt reports he drinks somewhere between a pint and a shot per day of liquor and sometimes beer.  Pt reports he occasionally needs an eye opener but is drinking daily.  Pt reports he began using drugs at the age of 68.  He reports he is currently using cocaine and crack consistently for the last month. Pt reports he has been to [facility] for this problem but the last hospitalization was several years ago.  Pt reports he has tried several types of medications in the past for his addiction. He reports taking Remeron in the past.  He is taking Buspar and Omeprazole at this time.  Pt reports he has been binging on EtOH and crack for the last 3 days without sleep.   Pt denies CP, SOB, abd pain, N/V/D, weakness, dizziness, syncope.  Pt denies SI/HI, auditory or visual hallucinations.    Pt reports he had chest pain earlier this afternoon (he is unsure what time but thinks it was around 3pm) after smoking $40 worth of crack.  He states the pain was sharp and radiated down his left arm, rated at a 7/10 at the time.  He denies cardiac hx.  He reports epigastric abd pain at this time, but no CP.  Review shows that patient was evaluated on  03/12/2015 for shortness of breath and chest pressure after drinking all night and smoking marijuana.  Pt also c/o right hand pain after punching a wall 1 month ago.  Pt reports he continues to have pain, swelling and bruising to the dorsum of the right hand. He reports he did not have it evaluated initially..    Past Medical History  Diagnosis Date  . GERD (gastroesophageal reflux disease)   . Depression   . Anxiety    History reviewed. No pertinent past surgical history. Family History  Problem Relation Age of Onset  . Hypertension Other   . Diabetes Other    History  Substance Use Topics  . Smoking status: Current Every Day Smoker -- 1.00 packs/day    Types: Cigarettes  . Smokeless tobacco: Not on file  . Alcohol Use: Yes     Comment: 1 pink per day     Review of Systems  Constitutional: Negative for fever, diaphoresis, appetite change, fatigue and unexpected weight change.  HENT: Negative for mouth sores.   Eyes: Negative for visual disturbance.  Respiratory: Negative for cough, chest tightness, shortness of breath and wheezing.   Cardiovascular: Positive for chest pain (resolved).  Gastrointestinal: Negative for nausea, vomiting, abdominal pain, diarrhea and constipation.  Endocrine: Negative for polydipsia, polyphagia and polyuria.  Genitourinary: Negative for dysuria, urgency, frequency and hematuria.  Musculoskeletal: Negative for back pain and neck stiffness.  Skin: Negative for rash.  Allergic/Immunologic: Negative for immunocompromised state.  Neurological: Negative for syncope, light-headedness and headaches.  Hematological: Does not bruise/bleed easily.  Psychiatric/Behavioral: Positive for dysphoric mood. Negative for sleep disturbance. The patient is nervous/anxious.       Allergies  Review of patient's allergies indicates no known allergies.  Home Medications   Prior to Admission medications   Medication Sig Start Date End Date Taking? Authorizing  Provider  busPIRone (BUSPAR) 15 MG tablet Take 15 mg by mouth 3 (three) times daily.   Yes Historical Provider, MD  omeprazole (PRILOSEC) 20 MG capsule Take 1 capsule (20 mg total) by mouth daily. Take one twice daily for the first 7 days then once daily after that Patient taking differently: Take 20 mg by mouth every other day.  10/28/14  Yes Shari Upstill, PA-C   BP 130/84 mmHg  Pulse 79  Temp(Src) 98 F (36.7 C) (Oral)  Resp 23  SpO2 98% Physical Exam  Constitutional: He appears well-developed and well-nourished. No distress.  Awake, alert, nontoxic appearance  HENT:  Head: Normocephalic and atraumatic.  Mouth/Throat: Oropharynx is clear and moist. No oropharyngeal exudate.  Eyes: Conjunctivae are normal. No scleral icterus.  Neck: Normal range of motion. Neck supple.  Cardiovascular: Regular rhythm, normal heart sounds and intact distal pulses.   tachycardia  Pulmonary/Chest: Effort normal and breath sounds normal. No respiratory distress. He has no wheezes.  Equal chest expansion  Abdominal: Soft. Bowel sounds are normal. He exhibits no mass. There is no tenderness. There is no rebound and no guarding.  Musculoskeletal: Normal range of motion. He exhibits no edema.  Neurological: He is alert.  Speech is clear and goal oriented Moves extremities without ataxia  Skin: Skin is warm and dry. He is not diaphoretic.  Psychiatric: He has a normal mood and affect.  Nursing note and vitals reviewed.   ED Course  Procedures (including critical care time) Labs Review Labs Reviewed  COMPREHENSIVE METABOLIC PANEL - Abnormal; Notable for the following:    Potassium 3.4 (*)    All other components within normal limits  URINE RAPID DRUG SCREEN (HOSP PERFORMED) - Abnormal; Notable for the following:    Cocaine POSITIVE (*)    All other components within normal limits  ACETAMINOPHEN LEVEL - Abnormal; Notable for the following:    Acetaminophen (Tylenol), Serum <10.0 (*)    All other  components within normal limits  ETHANOL - Abnormal; Notable for the following:    Alcohol, Ethyl (B) 28 (*)    All other components within normal limits  CBC  LIPASE, BLOOD  TROPONIN I  SALICYLATE LEVEL    Imaging Review Dg Chest 2 View  03/14/2015   CLINICAL DATA:  Punched table with right hand. Medical clearance. Initial encounter.  EXAM: CHEST  2 VIEW  COMPARISON:  Chest radiograph performed 03/10/2015  FINDINGS: The lungs are well-aerated and clear. There is no evidence of focal opacification, pleural effusion or pneumothorax.  The heart is normal in size; the mediastinal contour is within normal limits. Mild pectus excavatum is noted. No acute osseous abnormalities are seen.  IMPRESSION: No acute cardiopulmonary process seen.   Electronically Signed   By: Garald Balding M.D.   On: 03/14/2015 21:48   Dg Hand Complete Right  03/14/2015   CLINICAL DATA:  Punched table with right hand, with pain at the ulnar aspect of the right hand. Initial encounter.  EXAM: RIGHT HAND - COMPLETE 3+ VIEW  COMPARISON:  Right hand radiographs performed 05/01/2006 all  FINDINGS: There  are mildly comminuted fractures through the bases of the fourth and fifth metacarpals, likely extending to the carpometacarpal joint at the base of the fourth metacarpal. Overlying soft tissue swelling is noted. There is mild ulnar and dorsal displacement, with shortening at the fifth metacarpal.  No additional fractures are seen. The carpal rows appear grossly intact, and demonstrate normal alignment. The distal radius and ulna are grossly unremarkable in appearance.  IMPRESSION: Mildly comminuted fractures through the bases of the fourth and fifth metacarpals, likely extending to the fourth carpometacarpal joint. Mild ulnar and dorsal displacement noted at the fracture sites, with shortening at the fifth metacarpal.   Electronically Signed   By: Garald Balding M.D.   On: 03/14/2015 21:50     EKG Interpretation   Date/Time:   Saturday March 14 2015 19:49:21 EDT Ventricular Rate:  91 PR Interval:  149 QRS Duration: 101 QT Interval:  357 QTC Calculation: 439 R Axis:   -88 Text Interpretation:  Sinus rhythm Probable left atrial enlargement Left  axis deviation RSR' in V1 or V2, probably normal variant Confirmed by  Hazle Coca [phone]) on 03/14/2015 7:56:20 PM      MDM   Final diagnoses:  Chest pain  ETOH abuse  Cocaine abuse  Fracture of fifth metacarpal bone of right hand, closed, initial encounter   JOSIEL GAHM presents with his mother for detox however after history taking patient is unsure that he wants to O2 detox. He also complains of cocaine induced chest pain onset earlier today.  Patient's chest pain has resolved her EKG has change in the shape of T waves from previous.  Patient also complains of persistent right hand pain after punching a wall of proximal line one month ago. Will image.  10:26 PM Pt remains CP free at this time and his troponin is negative.  Pt x-ray with evidence of comminuted fractures through the bases of the fourth and fifth metacarpals. Chest x-ray without acute cardiopulmonary process.  Labs are reassuring with negative troponin. Patient is greater than 4 hours from onset of chest pain and cocaine usage.  Patient's initial tachycardia has resolved.  Patient denies risk factors for PE including immobilization, travel, recent surgery or fractures, swelling of either leg, hemoptysis or shortness of breath.  Long discussion had with patient about rehabilitation and drug and alcohol detox. Patient feels that he cannot commit to this at this time. He has been given a list of resources for substance abuse treatment as well as for her primary care physician's. He denies suicidal or homicidal ideation, auditory or visual hallucinations at this time. He is not psychotic. He denies history of seizure.  I have personally reviewed patient's vitals, nursing note and any pertinent labs or  imaging.  I performed an undressed physical exam.    It has been determined that no acute conditions requiring further emergency intervention are present at this time. The patient/guardian have been advised of the diagnosis and plan. I reviewed all labs and imaging including any potential incidental findings. We have discussed signs and symptoms that warrant return to the ED and they are listed in the discharge instructions.    Vital signs are stable at discharge.   BP 130/84 mmHg  Pulse 79  Temp(Src) 98 F (36.7 C) (Oral)  Resp 23  SpO2 98%        Abigail Butts, PA-C 03/15/15 0055  Quintella Reichert, MD 03/15/15 [phone]

## 2015-07-31 ENCOUNTER — Encounter (HOSPITAL_COMMUNITY): Payer: Self-pay | Admitting: Vascular Surgery

## 2015-07-31 ENCOUNTER — Emergency Department (HOSPITAL_COMMUNITY)
Admission: EM | Admit: 2015-07-31 | Discharge: 2015-07-31 | Disposition: A | Discharge status: Home / Self Care | Payer: Self-pay | Attending: Emergency Medicine | Admitting: Emergency Medicine

## 2015-07-31 DIAGNOSIS — K219 Gastro-esophageal reflux disease without esophagitis: Secondary | ICD-10-CM | POA: Insufficient documentation

## 2015-07-31 DIAGNOSIS — R300 Dysuria: Secondary | ICD-10-CM | POA: Insufficient documentation

## 2015-07-31 DIAGNOSIS — Z720 Tobacco use: Secondary | ICD-10-CM | POA: Insufficient documentation

## 2015-07-31 DIAGNOSIS — F419 Anxiety disorder, unspecified: Secondary | ICD-10-CM | POA: Insufficient documentation

## 2015-07-31 DIAGNOSIS — Z79899 Other long term (current) drug therapy: Secondary | ICD-10-CM | POA: Insufficient documentation

## 2015-07-31 LAB — URINALYSIS, ROUTINE W REFLEX MICROSCOPIC
BILIRUBIN URINE: NEGATIVE
Glucose, UA: NEGATIVE mg/dL
Hgb urine dipstick: NEGATIVE
Ketones, ur: NEGATIVE mg/dL
NITRITE: NEGATIVE
PROTEIN: NEGATIVE mg/dL
SPECIFIC GRAVITY, URINE: 1.031 — AB (ref 1.005–1.030)
UROBILINOGEN UA: 0.2 mg/dL (ref 0.0–1.0)
pH: 7 (ref 5.0–8.0)

## 2015-07-31 LAB — URINE MICROSCOPIC-ADD ON

## 2015-07-31 MED ORDER — SULFAMETHOXAZOLE-TRIMETHOPRIM 800-160 MG PO TABS: 1.0000 | ORAL_TABLET | Freq: Two times a day (BID) | ORAL | Status: AC

## 2015-07-31 MED ORDER — PHENAZOPYRIDINE HCL 200 MG PO TABS: 200.0000 mg | ORAL_TABLET | Freq: Three times a day (TID) | ORAL | Status: DC

## 2015-07-31 NOTE — Discharge Instructions (Signed)
Read the information below.  Use the prescribed medication as directed.  Please discuss all new medications with your pharmacist.  You may return to the Emergency Department at any time for worsening condition or any new symptoms that concern you.   If you develop high fevers, abdominal pain, testicular pain or swelling, uncontrolled vomiting, or are unable to tolerate fluids by mouth, return to the ER for a recheck.     Dysuria Dysuria is the medical term for pain with urination. There are many causes for dysuria, but urinary tract infection is the most common. If a urinalysis was performed it can show that there is a urinary tract infection. A urine culture confirms that you or your child is sick. You will need to follow up with a healthcare provider because:  If a urine culture was done you will need to know the culture results and treatment recommendations.  If the urine culture was positive, you or your child will need to be put on antibiotics or know if the antibiotics prescribed are the right antibiotics for your urinary tract infection.  If the urine culture is negative (no urinary tract infection), then other causes may need to be explored or antibiotics need to be stopped. Today laboratory work may have been done and there does not seem to be an infection. If cultures were done they will take at least 24 to 48 hours to be completed. Today x-rays may have been taken and they read as normal. No cause can be found for the problems. The x-rays may be re-read by a radiologist and you will be contacted if additional findings are made. You or your child may have been put on medications to help with this problem until you can see your primary caregiver. If the problems get better, see your primary caregiver if the problems return. If you were given antibiotics (medications which kill germs), take all of the mediations as directed for the full course of treatment.  If laboratory work was done, you  need to find the results. Leave a telephone number where you can be reached. If this is not possible, make sure you find out how you are to get test results. HOME CARE INSTRUCTIONS   Drink lots of fluids. For adults, drink eight, 8 ounce glasses of clear juice or water a day. For children, replace fluids as suggested by your caregiver.  Empty the bladder often. Avoid holding urine for long periods of time.  After a bowel movement, women should cleanse front to back, using each tissue only once.  Empty your bladder before and after sexual intercourse.  Take all the medicine given to you until it is gone. You may feel better in a few days, but TAKE ALL MEDICINE.  Avoid caffeine, tea, alcohol and carbonated beverages, because they tend to irritate the bladder.  In men, alcohol may irritate the prostate.  Only take over-the-counter or prescription medicines for pain, discomfort, or fever as directed by your caregiver.  If your caregiver has given you a follow-up appointment, it is very important to keep that appointment. Not keeping the appointment could result in a chronic or permanent injury, pain, and disability. If there is any problem keeping the appointment, you must call back to this facility for assistance. SEEK IMMEDIATE MEDICAL CARE IF:   Back pain develops.  A fever develops.  There is nausea (feeling sick to your stomach) or vomiting (throwing up).  Problems are no better with medications or are getting worse. MAKE  SURE YOU:   Understand these instructions.  Will watch your condition.  Will get help right away if you are not doing well or get worse. Document Released: 08/26/2004 Document Revised: 02/20/2012 Document Reviewed: 07/03/2008 [facility] Patient Information 2015 [place], [place]. This information is not intended to replace advice given to you by your health care provider. Make sure you discuss any questions you have with your health care provider.
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
## 2015-07-31 NOTE — ED Notes (Signed)
Pt reports to the ED for eval of white penile discharge and dysuria x several days. Reports some redness and irritation to head of penis, denies any laceration or abrasion. Denies any urinary frequency or hematuria. Denies any new sexual contacts. Pt denies any testicle swelling. Pt A&Ox4, resp e/u, and skin warm and dry.

## 2015-08-01 LAB — URINE CULTURE
CULTURE: NO GROWTH
SPECIAL REQUESTS: NORMAL

## 2015-08-01 LAB — HIV ANTIBODY (ROUTINE TESTING W REFLEX): HIV Screen 4th Generation wRfx: NONREACTIVE

## 2015-08-01 LAB — RPR: RPR Ser Ql: NONREACTIVE

## 2015-08-02 ENCOUNTER — Telehealth (HOSPITAL_COMMUNITY): Payer: Self-pay

## 2015-08-03 LAB — GC/CHLAMYDIA PROBE AMP (~~LOC~~) NOT AT ARMC
Chlamydia: NEGATIVE
Neisseria Gonorrhea: POSITIVE — AB

## 2015-08-04 ENCOUNTER — Telehealth (HOSPITAL_BASED_OUTPATIENT_CLINIC_OR_DEPARTMENT_OTHER): Payer: Self-pay | Admitting: Emergency Medicine

## 2015-08-04 NOTE — Telephone Encounter (Signed)
Positive Gonorrhea Chart sent to EDP for review

## 2015-08-05 ENCOUNTER — Telehealth (HOSPITAL_BASED_OUTPATIENT_CLINIC_OR_DEPARTMENT_OTHER): Payer: Self-pay | Admitting: Emergency Medicine

## 2015-08-05 NOTE — Telephone Encounter (Signed)
Post ED Visit - Positive Culture Follow-up: Successful Patient Follow-Up  Culture assessed and recommendations reviewed by: []  Heide Guile, Pharm.D., BCPS-AQ ID []  Alycia Rossetti, Pharm.D., BCPS []  [place], [place].D., BCPS, AAHIVP []  Legrand Como, Pharm.D., BCPS, AAHIVP []  Tegan Magsam, Pharm.D. []  Milus Glazier, Pharm.D.  Positive GC culture  [x]  Patient discharged without antimicrobial prescription and treatment is now indicated []  Organism is resistant to prescribed ED discharge antimicrobial []  Patient with positive blood cultures  Changes discussed with ED provider: Lajean Saver New antibiotic prescription Cefixime 400mg  po x 1 and zithromax 1 gram po x 1 Called to [facility]  Contacted patient, 08/05/15 @ 1710   Hazle Nordmann 08/05/2015, 5:08 PM

## 2015-08-13 ENCOUNTER — Telehealth (HOSPITAL_COMMUNITY): Payer: Self-pay

## 2015-09-25 ENCOUNTER — Emergency Department (HOSPITAL_COMMUNITY): Payer: Self-pay

## 2015-09-25 ENCOUNTER — Encounter (HOSPITAL_COMMUNITY): Payer: Self-pay | Admitting: Emergency Medicine

## 2015-09-25 ENCOUNTER — Emergency Department (HOSPITAL_COMMUNITY)
Admission: EM | Admit: 2015-09-25 | Discharge: 2015-09-26 | Disposition: A | Discharge status: Home / Self Care | Payer: Self-pay | Attending: Emergency Medicine | Admitting: Emergency Medicine

## 2015-09-25 DIAGNOSIS — S02401A Maxillary fracture, unspecified, initial encounter for closed fracture: Secondary | ICD-10-CM | POA: Insufficient documentation

## 2015-09-25 DIAGNOSIS — S299XXA Unspecified injury of thorax, initial encounter: Secondary | ICD-10-CM | POA: Insufficient documentation

## 2015-09-25 DIAGNOSIS — Z79899 Other long term (current) drug therapy: Secondary | ICD-10-CM | POA: Insufficient documentation

## 2015-09-25 DIAGNOSIS — Z8719 Personal history of other diseases of the digestive system: Secondary | ICD-10-CM | POA: Insufficient documentation

## 2015-09-25 DIAGNOSIS — Z8619 Personal history of other infectious and parasitic diseases: Secondary | ICD-10-CM | POA: Insufficient documentation

## 2015-09-25 DIAGNOSIS — F329 Major depressive disorder, single episode, unspecified: Secondary | ICD-10-CM | POA: Insufficient documentation

## 2015-09-25 DIAGNOSIS — Z792 Long term (current) use of antibiotics: Secondary | ICD-10-CM | POA: Insufficient documentation

## 2015-09-25 DIAGNOSIS — S199XXA Unspecified injury of neck, initial encounter: Secondary | ICD-10-CM | POA: Insufficient documentation

## 2015-09-25 DIAGNOSIS — S8991XA Unspecified injury of right lower leg, initial encounter: Secondary | ICD-10-CM | POA: Insufficient documentation

## 2015-09-25 DIAGNOSIS — Y9389 Activity, other specified: Secondary | ICD-10-CM | POA: Insufficient documentation

## 2015-09-25 DIAGNOSIS — F419 Anxiety disorder, unspecified: Secondary | ICD-10-CM | POA: Insufficient documentation

## 2015-09-25 DIAGNOSIS — F1010 Alcohol abuse, uncomplicated: Secondary | ICD-10-CM | POA: Insufficient documentation

## 2015-09-25 DIAGNOSIS — S4991XA Unspecified injury of right shoulder and upper arm, initial encounter: Secondary | ICD-10-CM | POA: Insufficient documentation

## 2015-09-25 DIAGNOSIS — S3991XA Unspecified injury of abdomen, initial encounter: Secondary | ICD-10-CM | POA: Insufficient documentation

## 2015-09-25 DIAGNOSIS — S022XXA Fracture of nasal bones, initial encounter for closed fracture: Secondary | ICD-10-CM | POA: Insufficient documentation

## 2015-09-25 DIAGNOSIS — S0012XA Contusion of left eyelid and periocular area, initial encounter: Secondary | ICD-10-CM | POA: Insufficient documentation

## 2015-09-25 DIAGNOSIS — Y999 Unspecified external cause status: Secondary | ICD-10-CM | POA: Insufficient documentation

## 2015-09-25 DIAGNOSIS — Z720 Tobacco use: Secondary | ICD-10-CM | POA: Insufficient documentation

## 2015-09-25 DIAGNOSIS — Y92410 Unspecified street and highway as the place of occurrence of the external cause: Secondary | ICD-10-CM | POA: Insufficient documentation

## 2015-09-25 DIAGNOSIS — F332 Major depressive disorder, recurrent severe without psychotic features: Secondary | ICD-10-CM | POA: Diagnosis present

## 2015-09-25 HISTORY — DX: Alcohol abuse, uncomplicated: F10.10

## 2015-09-25 HISTORY — DX: Gonococcal infection, unspecified: A54.9

## 2015-09-25 LAB — COMPREHENSIVE METABOLIC PANEL
ALK PHOS: 50 U/L (ref 38–126)
ALT: 25 U/L (ref 17–63)
AST: 28 U/L (ref 15–41)
Albumin: 4.5 g/dL (ref 3.5–5.0)
Anion gap: 11 (ref 5–15)
BUN: 7 mg/dL (ref 6–20)
CALCIUM: 8.4 mg/dL — AB (ref 8.9–10.3)
CO2: 25 mmol/L (ref 22–32)
CREATININE: 0.76 mg/dL (ref 0.61–1.24)
Chloride: 105 mmol/L (ref 101–111)
Glucose, Bld: 100 mg/dL — ABNORMAL HIGH (ref 65–99)
Potassium: 4.1 mmol/L (ref 3.5–5.1)
SODIUM: 141 mmol/L (ref 135–145)
Total Bilirubin: 0.4 mg/dL (ref 0.3–1.2)
Total Protein: 7.1 g/dL (ref 6.5–8.1)

## 2015-09-25 LAB — CBC
HCT: 46.6 % (ref 39.0–52.0)
Hemoglobin: 16.3 g/dL (ref 13.0–17.0)
MCH: 33.7 pg (ref 26.0–34.0)
MCHC: 35 g/dL (ref 30.0–36.0)
MCV: 96.5 fL (ref 78.0–100.0)
PLATELETS: 262 10*3/uL (ref 150–400)
RBC: 4.83 MIL/uL (ref 4.22–5.81)
RDW: 13 % (ref 11.5–15.5)
WBC: 8.4 10*3/uL (ref 4.0–10.5)

## 2015-09-25 LAB — ETHANOL: ALCOHOL ETHYL (B): 289 mg/dL — AB (ref <5)

## 2015-09-25 LAB — CDS SEROLOGY

## 2015-09-25 LAB — PROTIME-INR
INR: 1.01 (ref 0.00–1.49)
Prothrombin Time: 13.5 seconds (ref 11.6–15.2)

## 2015-09-25 MED ORDER — SODIUM CHLORIDE 0.9 % IV BOLUS (SEPSIS)
1000.0000 mL | Freq: Once | INTRAVENOUS | Status: AC
  Administered 2015-09-25: 1000 mL via INTRAVENOUS

## 2015-09-25 MED ORDER — FENTANYL CITRATE (PF) 100 MCG/2ML IJ SOLN
50.0000 ug | Freq: Once | INTRAMUSCULAR | Status: AC
  Administered 2015-09-25: 50 ug via INTRAVENOUS
  Filled 2015-09-25: qty 2

## 2015-09-25 MED ORDER — IOHEXOL 300 MG/ML  SOLN
100.0000 mL | Freq: Once | INTRAMUSCULAR | Status: AC | PRN
  Administered 2015-09-26: 100 mL via INTRAVENOUS
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
## 2015-09-25 NOTE — ED Notes (Signed)
SITTER REQUESTED
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
## 2015-09-26 ENCOUNTER — Emergency Department (HOSPITAL_COMMUNITY): Payer: Self-pay

## 2015-09-26 ENCOUNTER — Encounter (HOSPITAL_COMMUNITY): Payer: Self-pay | Admitting: *Deleted

## 2015-09-26 DIAGNOSIS — F1010 Alcohol abuse, uncomplicated: Secondary | ICD-10-CM | POA: Diagnosis present

## 2015-09-26 DIAGNOSIS — F332 Major depressive disorder, recurrent severe without psychotic features: Secondary | ICD-10-CM | POA: Diagnosis present

## 2015-09-26 LAB — RAPID URINE DRUG SCREEN, HOSP PERFORMED
AMPHETAMINES: NOT DETECTED
BARBITURATES: NOT DETECTED
BENZODIAZEPINES: NOT DETECTED
Cocaine: NOT DETECTED
Opiates: NOT DETECTED
TETRAHYDROCANNABINOL: POSITIVE — AB

## 2015-09-26 LAB — SAMPLE TO BLOOD BANK

## 2015-09-26 MED ORDER — LORAZEPAM 1 MG PO TABS: 0.0000 mg | ORAL_TABLET | Freq: Two times a day (BID) | ORAL | Status: DC

## 2015-09-26 MED ORDER — ONDANSETRON 4 MG PO TBDP
4.0000 mg | ORAL_TABLET | Freq: Once | ORAL | Status: AC
  Administered 2015-09-26: 4 mg via ORAL
  Filled 2015-09-26: qty 1

## 2015-09-26 MED ORDER — HYDROCODONE-ACETAMINOPHEN 5-325 MG PO TABS: 1.0000 | ORAL_TABLET | ORAL | Status: DC | PRN

## 2015-09-26 MED ORDER — VITAMIN B-1 100 MG PO TABS
100.0000 mg | ORAL_TABLET | Freq: Every day | ORAL | Status: DC
  Administered 2015-09-26: 100 mg via ORAL
  Filled 2015-09-26: qty 1

## 2015-09-26 MED ORDER — BUSPIRONE HCL 15 MG PO TABS: 15.0000 mg | ORAL_TABLET | Freq: Two times a day (BID) | ORAL | Status: DC

## 2015-09-26 MED ORDER — IBUPROFEN 800 MG PO TABS
800.0000 mg | ORAL_TABLET | Freq: Once | ORAL | Status: AC
  Administered 2015-09-26: 800 mg via ORAL
  Filled 2015-09-26: qty 1

## 2015-09-26 MED ORDER — LORAZEPAM 2 MG/ML IJ SOLN: 0.0000 mg | Freq: Two times a day (BID) | INTRAMUSCULAR | Status: DC

## 2015-09-26 MED ORDER — THIAMINE HCL 100 MG/ML IJ SOLN: 100.0000 mg | Freq: Every day | INTRAMUSCULAR | Status: DC

## 2015-09-26 MED ORDER — GI COCKTAIL ~~LOC~~
30.0000 mL | Freq: Once | ORAL | Status: AC
  Administered 2015-09-26: 30 mL via ORAL
  Filled 2015-09-26: qty 30

## 2015-09-26 MED ORDER — LORAZEPAM 1 MG PO TABS
0.0000 mg | ORAL_TABLET | Freq: Four times a day (QID) | ORAL | Status: DC
  Administered 2015-09-26: 1 mg via ORAL
  Filled 2015-09-26: qty 1

## 2015-09-26 MED ORDER — FENTANYL CITRATE (PF) 100 MCG/2ML IJ SOLN
50.0000 ug | Freq: Once | INTRAMUSCULAR | Status: AC
  Administered 2015-09-26: 50 ug via INTRAVENOUS
  Filled 2015-09-26: qty 2

## 2015-09-26 MED ORDER — HYDROCODONE-ACETAMINOPHEN 5-325 MG PO TABS
1.0000 | ORAL_TABLET | Freq: Once | ORAL | Status: AC
  Administered 2015-09-26: 1 via ORAL
  Filled 2015-09-26: qty 1

## 2015-09-26 MED ORDER — LORAZEPAM 2 MG/ML IJ SOLN: 0.0000 mg | Freq: Four times a day (QID) | INTRAMUSCULAR | Status: DC

## 2015-09-26 MED ORDER — TRAMADOL HCL 50 MG PO TABS
50.0000 mg | ORAL_TABLET | Freq: Once | ORAL | Status: DC
  Filled 2015-09-26: qty 1

## 2015-09-26 MED ORDER — CLINDAMYCIN HCL 300 MG PO CAPS: 300.0000 mg | ORAL_CAPSULE | Freq: Four times a day (QID) | ORAL | Status: DC

## 2015-09-26 MED ORDER — CLINDAMYCIN HCL 300 MG PO CAPS
300.0000 mg | ORAL_CAPSULE | Freq: Three times a day (TID) | ORAL | Status: DC
  Administered 2015-09-26 (×2): 300 mg via ORAL
  Filled 2015-09-26 (×5): qty 1

## 2015-09-26 MED ORDER — NICOTINE 21 MG/24HR TD PT24
21.0000 mg | MEDICATED_PATCH | Freq: Every day | TRANSDERMAL | Status: DC
  Administered 2015-09-26: 21 mg via TRANSDERMAL
  Filled 2015-09-26: qty 1

## 2015-09-26 NOTE — ED Notes (Signed)
Per Lenn Cal, [facility], [place], NP, [facility] - advised tx plan is to d/c pt.

## 2015-09-26 NOTE — ED Provider Notes (Signed)
Sign out from Dr. Lita Mains.  Patient awaiting psychiatric clearance.   Pattricia Boss, MD 09/26/15 [phone]
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
## 2015-09-26 NOTE — ED Notes (Signed)
Pt asking if was given DWI - advised pt this RN is unaware and offered for [employer] to come speak w/him - pt declined at this time.
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
## 2015-09-26 NOTE — ED Notes (Signed)
Attempted to contact pt's mother - mobile number entered is incorrect. Called Tommy, pt's step-father and left a message on VM for him to please have pt's mother to call RN.
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
## 2015-09-26 NOTE — ED Notes (Signed)
Pt on phone w/girlfriend at nurses' desk - [place]. [facility] policies handout given to pt so he may advise her of visiting times.

## 2015-09-26 NOTE — ED Notes (Signed)
Heloise Purpura, NP, [facility] - called and advised is entering note.

## 2015-09-26 NOTE — ED Notes (Signed)
Pt speaking with [employer] with TTS.
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
## 2015-09-26 NOTE — ED Notes (Addendum)
Pt and mother advised this is pt's 2nd DWI - states first was 3-1/2 years ago and has not gone to court over it yet. States has paperwork from Event organiser for pt from last pm. Mother pointing toward pt's arms showing prior cut marks - no open areas noted - no bleeding/drainage. Mother stating she wants pt to get help w/ETOH. Allowed mother to verbalized concerns.

## 2015-09-26 NOTE — ED Notes (Addendum)
Pt's mother transporting pt from ED. Pt aware Cazzie (gf) called him and left message.Pt given ice pack to take home as requested by mother. Outpt Resources given for ETOH detox.

## 2015-09-26 NOTE — ED Notes (Signed)
IVC papers rescinded by Dr Jeanell Sparrow - faxed to [facility].

## 2015-09-26 NOTE — ED Notes (Signed)
Per Lenn Cal, [facility], aware pt needs Telepsych.

## 2015-09-26 NOTE — ED Notes (Signed)
Telepsych being performed by Heloise Purpura, NP, [facility].
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
## 2015-09-26 NOTE — ED Notes (Addendum)
Dr Jeanell Sparrow aware pt asking for pain med - order received for Ibuprofen. Ice pack given to pt also.

## 2015-09-26 NOTE — ED Notes (Signed)
IVC PAPERS - COPY OF FINDINGS AND CUSTODY AND AFFIDAVIT FAXED TO [place] AND COPY SENT TO [facility]. COPY OF [employer] OBSERVATION SHEET SENT TO [facility]. WAITING ON TELEPSYCH TO BE PERFORMED PRIOR TO EDP COMPLETING 1ST EXAM.

## 2016-03-05 ENCOUNTER — Emergency Department (HOSPITAL_COMMUNITY)
Admission: EM | Admit: 2016-03-05 | Discharge: 2016-03-06 | Disposition: A | Discharge status: Other Acute Inpatient Hospital | Payer: Self-pay | Attending: Emergency Medicine | Admitting: Emergency Medicine

## 2016-03-05 ENCOUNTER — Encounter (HOSPITAL_COMMUNITY): Payer: Self-pay | Admitting: Emergency Medicine

## 2016-03-05 DIAGNOSIS — F1024 Alcohol dependence with alcohol-induced mood disorder: Secondary | ICD-10-CM | POA: Insufficient documentation

## 2016-03-05 DIAGNOSIS — IMO0002 Reserved for concepts with insufficient information to code with codable children: Secondary | ICD-10-CM

## 2016-03-05 DIAGNOSIS — F419 Anxiety disorder, unspecified: Secondary | ICD-10-CM | POA: Insufficient documentation

## 2016-03-05 DIAGNOSIS — Y9289 Other specified places as the place of occurrence of the external cause: Secondary | ICD-10-CM | POA: Insufficient documentation

## 2016-03-05 DIAGNOSIS — Z79899 Other long term (current) drug therapy: Secondary | ICD-10-CM | POA: Insufficient documentation

## 2016-03-05 DIAGNOSIS — Z8619 Personal history of other infectious and parasitic diseases: Secondary | ICD-10-CM | POA: Insufficient documentation

## 2016-03-05 DIAGNOSIS — F1094 Alcohol use, unspecified with alcohol-induced mood disorder: Secondary | ICD-10-CM

## 2016-03-05 DIAGNOSIS — K219 Gastro-esophageal reflux disease without esophagitis: Secondary | ICD-10-CM | POA: Insufficient documentation

## 2016-03-05 DIAGNOSIS — F10230 Alcohol dependence with withdrawal, uncomplicated: Secondary | ICD-10-CM

## 2016-03-05 DIAGNOSIS — Y998 Other external cause status: Secondary | ICD-10-CM | POA: Insufficient documentation

## 2016-03-05 DIAGNOSIS — X788XXA Intentional self-harm by other sharp object, initial encounter: Secondary | ICD-10-CM | POA: Insufficient documentation

## 2016-03-05 DIAGNOSIS — F1010 Alcohol abuse, uncomplicated: Secondary | ICD-10-CM

## 2016-03-05 DIAGNOSIS — S60512A Abrasion of left hand, initial encounter: Secondary | ICD-10-CM | POA: Insufficient documentation

## 2016-03-05 DIAGNOSIS — Z7289 Other problems related to lifestyle: Secondary | ICD-10-CM

## 2016-03-05 DIAGNOSIS — F17210 Nicotine dependence, cigarettes, uncomplicated: Secondary | ICD-10-CM | POA: Insufficient documentation

## 2016-03-05 DIAGNOSIS — F329 Major depressive disorder, single episode, unspecified: Secondary | ICD-10-CM | POA: Insufficient documentation

## 2016-03-05 DIAGNOSIS — Y9389 Activity, other specified: Secondary | ICD-10-CM | POA: Insufficient documentation

## 2016-03-06 ENCOUNTER — Observation Stay (HOSPITAL_COMMUNITY)
Admission: AD | Admit: 2016-03-06 | Discharge: 2016-03-07 | Disposition: A | Discharge status: Home / Self Care | Payer: Self-pay | Source: Intra-hospital | Attending: Psychiatry | Admitting: Psychiatry

## 2016-03-06 DIAGNOSIS — Z590 Homelessness: Secondary | ICD-10-CM | POA: Insufficient documentation

## 2016-03-06 DIAGNOSIS — F1094 Alcohol use, unspecified with alcohol-induced mood disorder: Secondary | ICD-10-CM | POA: Diagnosis present

## 2016-03-06 DIAGNOSIS — F1290 Cannabis use, unspecified, uncomplicated: Secondary | ICD-10-CM | POA: Insufficient documentation

## 2016-03-06 DIAGNOSIS — F17210 Nicotine dependence, cigarettes, uncomplicated: Secondary | ICD-10-CM | POA: Insufficient documentation

## 2016-03-06 DIAGNOSIS — F639 Impulse disorder, unspecified: Secondary | ICD-10-CM | POA: Insufficient documentation

## 2016-03-06 DIAGNOSIS — F1490 Cocaine use, unspecified, uncomplicated: Secondary | ICD-10-CM | POA: Insufficient documentation

## 2016-03-06 DIAGNOSIS — F411 Generalized anxiety disorder: Secondary | ICD-10-CM | POA: Insufficient documentation

## 2016-03-06 DIAGNOSIS — F1024 Alcohol dependence with alcohol-induced mood disorder: Principal | ICD-10-CM | POA: Insufficient documentation

## 2016-03-06 DIAGNOSIS — F10230 Alcohol dependence with withdrawal, uncomplicated: Secondary | ICD-10-CM | POA: Diagnosis present

## 2016-03-06 DIAGNOSIS — Z915 Personal history of self-harm: Secondary | ICD-10-CM | POA: Insufficient documentation

## 2016-03-06 DIAGNOSIS — F10239 Alcohol dependence with withdrawal, unspecified: Secondary | ICD-10-CM | POA: Insufficient documentation

## 2016-03-06 DIAGNOSIS — F329 Major depressive disorder, single episode, unspecified: Secondary | ICD-10-CM | POA: Insufficient documentation

## 2016-03-06 DIAGNOSIS — K219 Gastro-esophageal reflux disease without esophagitis: Secondary | ICD-10-CM | POA: Insufficient documentation

## 2016-03-06 LAB — CBC
HEMATOCRIT: 45.2 % (ref 39.0–52.0)
HEMOGLOBIN: 15.9 g/dL (ref 13.0–17.0)
MCH: 33.8 pg (ref 26.0–34.0)
MCHC: 35.2 g/dL (ref 30.0–36.0)
MCV: 96 fL (ref 78.0–100.0)
Platelets: 292 10*3/uL (ref 150–400)
RBC: 4.71 MIL/uL (ref 4.22–5.81)
RDW: 13 % (ref 11.5–15.5)
WBC: 8.1 10*3/uL (ref 4.0–10.5)

## 2016-03-06 LAB — COMPREHENSIVE METABOLIC PANEL
ALBUMIN: 4.8 g/dL (ref 3.5–5.0)
ALK PHOS: 49 U/L (ref 38–126)
ALT: 40 U/L (ref 17–63)
ANION GAP: 11 (ref 5–15)
AST: 28 U/L (ref 15–41)
BILIRUBIN TOTAL: 0.7 mg/dL (ref 0.3–1.2)
BUN: 10 mg/dL (ref 6–20)
CALCIUM: 9.2 mg/dL (ref 8.9–10.3)
CO2: 25 mmol/L (ref 22–32)
Chloride: 101 mmol/L (ref 101–111)
Creatinine, Ser: 0.86 mg/dL (ref 0.61–1.24)
GFR calc Af Amer: >60 mL/min (ref >60)
GFR calc non Af Amer: >60 mL/min (ref >60)
GLUCOSE: 97 mg/dL (ref 65–99)
Potassium: 3.4 mmol/L — ABNORMAL LOW (ref 3.5–5.1)
SODIUM: 137 mmol/L (ref 135–145)
Total Protein: 8 g/dL (ref 6.5–8.1)

## 2016-03-06 LAB — SALICYLATE LEVEL: Salicylate Lvl: <4 mg/dL (ref 2.8–30.0)

## 2016-03-06 LAB — ACETAMINOPHEN LEVEL

## 2016-03-06 LAB — ETHANOL: Alcohol, Ethyl (B): 37 mg/dL — ABNORMAL HIGH (ref <5)

## 2016-03-06 MED ORDER — ALUM & MAG HYDROXIDE-SIMETH 200-200-20 MG/5ML PO SUSP: 30.0000 mL | ORAL | Status: DC | PRN

## 2016-03-06 MED ORDER — THIAMINE HCL 100 MG/ML IJ SOLN: 100.0000 mg | Freq: Every day | INTRAMUSCULAR | Status: DC

## 2016-03-06 MED ORDER — ZOLPIDEM TARTRATE 5 MG PO TABS: 10.0000 mg | ORAL_TABLET | Freq: Every evening | ORAL | Status: DC | PRN

## 2016-03-06 MED ORDER — IBUPROFEN 600 MG PO TABS: 600.0000 mg | ORAL_TABLET | Freq: Three times a day (TID) | ORAL | Status: DC | PRN

## 2016-03-06 MED ORDER — LORAZEPAM 1 MG PO TABS
0.0000 mg | ORAL_TABLET | Freq: Four times a day (QID) | ORAL | Status: DC
  Administered 2016-03-06 (×2): 1 mg via ORAL
  Filled 2016-03-06 (×2): qty 1

## 2016-03-06 MED ORDER — VITAMIN B-1 100 MG PO TABS
100.0000 mg | ORAL_TABLET | Freq: Every day | ORAL | Status: DC
  Administered 2016-03-07: 100 mg via ORAL
  Filled 2016-03-06: qty 7
  Filled 2016-03-06: qty 1

## 2016-03-06 MED ORDER — ACETAMINOPHEN 325 MG PO TABS: 650.0000 mg | ORAL_TABLET | ORAL | Status: DC | PRN

## 2016-03-06 MED ORDER — LORAZEPAM 1 MG PO TABS
0.0000 mg | ORAL_TABLET | Freq: Four times a day (QID) | ORAL | Status: DC
  Administered 2016-03-06 – 2016-03-07 (×2): 1 mg via ORAL
  Filled 2016-03-06 (×2): qty 1

## 2016-03-06 MED ORDER — NICOTINE 21 MG/24HR TD PT24
21.0000 mg | MEDICATED_PATCH | Freq: Every day | TRANSDERMAL | Status: DC
  Administered 2016-03-07: 21 mg via TRANSDERMAL
  Filled 2016-03-06: qty 1

## 2016-03-06 MED ORDER — MAGNESIUM HYDROXIDE 400 MG/5ML PO SUSP: 30.0000 mL | Freq: Every day | ORAL | Status: DC | PRN

## 2016-03-06 MED ORDER — VITAMIN B-1 100 MG PO TABS
100.0000 mg | ORAL_TABLET | Freq: Every day | ORAL | Status: DC
  Administered 2016-03-06: 100 mg via ORAL
  Filled 2016-03-06: qty 1

## 2016-03-06 MED ORDER — CITALOPRAM HYDROBROMIDE 10 MG PO TABS
10.0000 mg | ORAL_TABLET | Freq: Every day | ORAL | Status: DC
  Administered 2016-03-06: 10 mg via ORAL
  Filled 2016-03-06: qty 1

## 2016-03-06 MED ORDER — ONDANSETRON HCL 4 MG PO TABS: 4.0000 mg | ORAL_TABLET | Freq: Three times a day (TID) | ORAL | Status: DC | PRN

## 2016-03-06 MED ORDER — IBUPROFEN 200 MG PO TABS: 600.0000 mg | ORAL_TABLET | Freq: Three times a day (TID) | ORAL | Status: DC | PRN

## 2016-03-06 MED ORDER — LORAZEPAM 1 MG PO TABS: 0.0000 mg | ORAL_TABLET | Freq: Two times a day (BID) | ORAL | Status: DC

## 2016-03-06 MED ORDER — NICOTINE 21 MG/24HR TD PT24
21.0000 mg | MEDICATED_PATCH | Freq: Every day | TRANSDERMAL | Status: DC
  Administered 2016-03-06: 21 mg via TRANSDERMAL
  Filled 2016-03-06: qty 1

## 2016-03-06 MED ORDER — CITALOPRAM HYDROBROMIDE 10 MG PO TABS
10.0000 mg | ORAL_TABLET | Freq: Every day | ORAL | Status: DC
  Administered 2016-03-07: 10 mg via ORAL
  Filled 2016-03-06: qty 7
  Filled 2016-03-06: qty 1

## 2016-03-06 MED ORDER — ZOLPIDEM TARTRATE 10 MG PO TABS: 10.0000 mg | ORAL_TABLET | Freq: Every evening | ORAL | Status: DC | PRN

## 2016-03-06 MED ORDER — ACETAMINOPHEN 325 MG PO TABS: 650.0000 mg | ORAL_TABLET | Freq: Four times a day (QID) | ORAL | Status: DC | PRN

## 2016-03-06 MED ORDER — TRAZODONE HCL 50 MG PO TABS
50.0000 mg | ORAL_TABLET | Freq: Every evening | ORAL | Status: DC | PRN
  Administered 2016-03-06: 50 mg via ORAL
  Filled 2016-03-06: qty 14
  Filled 2016-03-06: qty 1

## 2016-03-06 NOTE — ED Notes (Signed)
Pt to transfer after 1900 per obs

## 2016-03-06 NOTE — ED Notes (Signed)
Patient noted sleeping in room. No complaints, stable, in no acute distress. Q15 minute rounds and monitoring via Security Cameras to continue.  

## 2016-03-06 NOTE — ED Notes (Signed)
CIWA defered-pt sleeping
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
## 2016-03-06 NOTE — ED Notes (Signed)
Patient noted in room. No complaints, stable, in no acute distress. Q15 minute rounds and monitoring via Security Cameras to continue.  

## 2016-03-06 NOTE — ED Notes (Signed)
tts into see 

## 2016-03-06 NOTE — ED Notes (Signed)
Report received from [place]. Patient alert and oriented, warm and dry, in no acute distress. Patient denies SI, HI, AVH and pain. Patient made aware of Q15 minute rounds and security cameras for their safety. Patient instructed to come to me with needs or concerns.
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
## 2016-03-06 NOTE — BHH Counselor (Signed)
Patient reviewed and signed voluntary consent to treat to the observation unit and ROI to his girlfriend. Patient denies any questions or concerns at this time. Paperwork faxed to [facility] and provided to patients nurse to transport with patient. Call nurse report to [phone] and [facility] to transport.  Accepting nurse will determine time for admission.   Rosalin Hawking, LCSW Therapeutic Triage Specialist [place] 03/06/2016 4:00 PM

## 2016-03-06 NOTE — ED Notes (Signed)
Pt. Transferred to [facility] from ED to room 36. Report from [facility]. Pt. Oriented to unit including Q15 minute rounds as well as the security cameras for their protection. Patient is alert and oriented, warm and dry in no acute distress. Patient denies SI, HI, and AVH. Pt. Encouraged to let me know if needs arise.

## 2016-03-06 NOTE — BH Assessment (Signed)
0315:  Consulted with Dr. Tomi Bamberger about the Patient.  Reports Patient consumes alcohol and  is depressed, self harms by cutting, SI with no plan, unemployed, and homeless.  Receives medication management from [place].  0335:  Completed assessment  0340:  Consulted with Extender Darlyne Russian.  Per Extender Harold Hedge:  Patient meets inpatient criteria seek residential substance use treatment placement.  0342:  Provided Dr. Tomi Bamberger with Patient's disposition.

## 2016-03-06 NOTE — ED Notes (Signed)
Up to the bathroom 

## 2016-03-07 ENCOUNTER — Encounter (HOSPITAL_COMMUNITY): Payer: Self-pay | Admitting: *Deleted

## 2016-03-07 DIAGNOSIS — F1094 Alcohol use, unspecified with alcohol-induced mood disorder: Secondary | ICD-10-CM

## 2016-03-07 MED ORDER — NICOTINE 21 MG/24HR TD PT24: 21.0000 mg | MEDICATED_PATCH | Freq: Every day | TRANSDERMAL | Status: DC

## 2016-03-07 MED ORDER — LORAZEPAM 1 MG PO TABS: 1.0000 mg | ORAL_TABLET | Freq: Every day | ORAL | Status: DC

## 2016-03-07 MED ORDER — CITALOPRAM HYDROBROMIDE 10 MG PO TABS: 10.0000 mg | ORAL_TABLET | Freq: Every day | ORAL | Status: DC

## 2016-03-07 MED ORDER — LORAZEPAM 1 MG PO TABS
1.0000 mg | ORAL_TABLET | Freq: Three times a day (TID) | ORAL | Status: DC
  Administered 2016-03-07 (×2): 1 mg via ORAL
  Filled 2016-03-07 (×2): qty 1

## 2016-03-07 MED ORDER — THIAMINE HCL 100 MG PO TABS: 100.0000 mg | ORAL_TABLET | Freq: Every day | ORAL | Status: DC

## 2016-03-07 MED ORDER — LORAZEPAM 1 MG PO TABS: 1.0000 mg | ORAL_TABLET | Freq: Two times a day (BID) | ORAL | Status: DC

## 2016-03-07 MED ORDER — TRAZODONE HCL 50 MG PO TABS: 50.0000 mg | ORAL_TABLET | Freq: Every evening | ORAL | Status: DC | PRN
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
## 2016-03-07 NOTE — BH Assessment (Addendum)
Per Mickel Baas, NP - patient is psychiatrically stable and ready for discharge.  Patient denies SI/HI/Psychosis.  Patient will be able to follow up with his established provider [facility]) in the walk in clinic.   Patient girlfriend will be picking him up and he will be staying with her temporarily.
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
## 2016-03-07 NOTE — Progress Notes (Signed)
Nursing Shift Note:  Patient sleeping, is easily awakened, is denying any SI/HI/AVH and contracts for safety on the Unit. Patient is slightly irritable and demanding in his behavior but unknown if this is his baseline. Nurse providing emotional support and thereapeutic commuication and adminstering medications as ordered in addition to ensuring continuous observation of patient for safety except when in the bathroom. Patient remains safe on Unit.

## 2016-03-07 NOTE — Progress Notes (Signed)
Nursing Discharge Note  Patient receiving copy of AVS Discharge Summary for self and signing with RN the other copy for the chart. Patient receiving belongings and signing for them and also receiving prescriptions and a few days med supplies of ordered psychotropic meds. Patient stating to Nurse what he has been instructed to do for followup plan and states that he will be following those instructions. Patient denying any SI/HI/AVH and states he is ready to be discharged home. Nurse escorting patient to [facility] where he is awaiting to be picked up by his significant other.
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
## 2018-12-08 ENCOUNTER — Emergency Department (HOSPITAL_COMMUNITY): Payer: Self-pay

## 2018-12-08 ENCOUNTER — Encounter (HOSPITAL_COMMUNITY): Payer: Self-pay | Admitting: *Deleted

## 2018-12-08 ENCOUNTER — Emergency Department (HOSPITAL_COMMUNITY)
Admission: EM | Admit: 2018-12-08 | Discharge: 2018-12-08 | Disposition: A | Discharge status: Home / Self Care | Payer: Self-pay | Attending: Emergency Medicine | Admitting: Emergency Medicine

## 2018-12-08 DIAGNOSIS — M545 Low back pain: Secondary | ICD-10-CM | POA: Insufficient documentation

## 2018-12-08 DIAGNOSIS — Z79899 Other long term (current) drug therapy: Secondary | ICD-10-CM | POA: Insufficient documentation

## 2018-12-08 DIAGNOSIS — F17210 Nicotine dependence, cigarettes, uncomplicated: Secondary | ICD-10-CM | POA: Insufficient documentation

## 2018-12-08 DIAGNOSIS — Y929 Unspecified place or not applicable: Secondary | ICD-10-CM | POA: Insufficient documentation

## 2018-12-08 DIAGNOSIS — M549 Dorsalgia, unspecified: Secondary | ICD-10-CM

## 2018-12-08 DIAGNOSIS — R51 Headache: Secondary | ICD-10-CM | POA: Insufficient documentation

## 2018-12-08 DIAGNOSIS — R519 Headache, unspecified: Secondary | ICD-10-CM

## 2018-12-08 DIAGNOSIS — W19XXXA Unspecified fall, initial encounter: Secondary | ICD-10-CM | POA: Insufficient documentation

## 2018-12-08 DIAGNOSIS — Y9389 Activity, other specified: Secondary | ICD-10-CM | POA: Insufficient documentation

## 2018-12-08 DIAGNOSIS — Y999 Unspecified external cause status: Secondary | ICD-10-CM | POA: Insufficient documentation

## 2018-12-08 MED ORDER — LIDOCAINE 5 % EX PTCH: 1.0000 | MEDICATED_PATCH | CUTANEOUS | 0 refills | Status: DC

## 2018-12-08 MED ORDER — IBUPROFEN 200 MG PO TABS
600.0000 mg | ORAL_TABLET | Freq: Once | ORAL | Status: AC
  Administered 2018-12-08: 600 mg via ORAL
  Filled 2018-12-08: qty 3

## 2018-12-08 NOTE — Discharge Instructions (Addendum)
You have been diagnosed today with headache and back pain after fall.  At this time there does not appear to be the presence of an emergent medical condition, however there is always the potential for conditions to change. Please read and follow the below instructions.  Please return to the Emergency Department immediately for any new or worsening symptoms or if your symptoms do not improve in two days Please be sure to follow up with your Primary Care Provider next week regarding your visit today; please call their office to schedule an appointment even if you are feeling better for a follow-up visit. You may use over-the-counter anti-inflammatories such as ibuprofen to help with your muscular soreness.  Please drink plenty water and get plenty of rest over the next few days to help with your symptoms.  Please use the resources below to help find resources related to your alcohol use. You may use the lidoderm patch as prescribed to help with your symptoms.  Get help right away if: You develop new bowel or bladder control problems. You have unusual weakness or numbness in your arms or legs. You develop nausea or vomiting. You develop abdominal pain. You feel faint. Get help right away if: Your headache gets very bad quickly. Your headache gets worse after a lot of physical activity. You keep throwing up. You have a stiff neck. You have trouble seeing. You have trouble speaking. You have pain in the eye or ear. Your muscles are weak or you lose muscle control. You lose your balance or have trouble walking. You feel like you will pass out (faint) or you pass out. You are mixed up (confused). You have a seizure.  Please read the additional information packets attached to your discharge summary.  RESOURCE GUIDE  Chronic Pain Problems: Contact [facility]  [phone] Patients need to be referred by their primary care doctor.  Insufficient Money for Medicine: Contact  United Way:  call "211" or [place] [phone].  No Primary Care Doctor: Call Health Connect  [phone] - can help you locate a primary care doctor that  accepts your insurance, provides certain services, etc. Physician Referral Service- [phone]  Agencies that provide inexpensive medical care: [facility]  [facility]  [phone] [facility]  [phone] [facility]  [phone] Planned Parenthood  [phone] [facility]  [phone]  Mutual Providers: [facility]- [address], [address]  [phone], Mon-Fri 9am-7pm, Sat 9am-1pm [place], [address] [place]  [place], [address] [place]  [place]- [address]  [place], [address], [phone]  Only accepts [place] patients after they have their name  applied to their card  Self Pay (no insurance) in [facility]: Sickle Cell Patients: Dr Kevan Ny, [facility]  [place], [facility]- [place]  [facility]- [address], [facility]- see information above (Speak to [employer] if you do not have insurance)       -  [facility]- [place], [place]       -  [facility] [place],  [place]       -  [place], [place]  Dr Vista Lawman-  [address], [address], [place], [facility]- [address] [phone]       -  [facility]- [address], [place], also [address] [phone]       -    [facility]- [address], [place], 1st & 3rd Saturday   every month,  10am-1pm  1) Find a Doctor and Pay Out of Pocket Although you won't have to find out who is covered by your insurance plan, it is a good idea to ask around and get recommendations. You will then need to call the office and see if the doctor you have chosen will accept you as a new patient and what types of options they offer for patients who are self-pay. Some doctors offer discounts or will set up payment plans for their patients who do not have insurance, but you will need to ask so you aren't surprised when you get to your appointment.  2) Contact Your Local Health Department Not all health departments have doctors that can see patients for sick visits, but many do, so it is worth a call to see if yours does. If you don't know where your local health department is, you can check in your phone book. The CDC also has a tool to help you locate your state's health department, and many state websites also have listings of all of their local health departments.  3) Find a [facility] If your illness is not likely to be very severe or complicated, you may want to try a walk in clinic. These are popping up all over the country in pharmacies, drugstores, and shopping centers. They're usually staffed by nurse practitioners or physician assistants that have been trained to treat common illnesses and complaints. They're usually fairly quick and inexpensive. However, if you have serious medical issues or chronic medical problems, these are probably not your best option  STD [place], [facility], [address] [place], phone [phone] or [phone].  Monday - Friday, call for an appointment. [place], [facility] Clinic, [address], [place], phone [phone] or [phone].  Monday - Friday, call for an appointment.  Abuse/Neglect: [place] [phone] [place] [phone] (After Hours)  Emergency Shelter:  [facility] [phone]  Maternity Homes: Room at the [place] [phone] [place] [phone]  MRSA Hotline #:   (860)767-1794  [facility] [place] [facility] Dept. [address] [place]  [facility] Phone:  [phone]                                  Phone:  [phone]                   Phone:  [phone]  [facility], [place]- [phone]       -     [facility] in [place], [address],                                  [phone], [place] [phone] or [phone] (After Hours)   [place]  Substance Abuse Resources: Alcohol and Drug Services  [phone] [place] [phone] [facility] [place] [phone] Chinita Pester [phone] Residential & Outpatient Substance Abuse Program  ([phone]  Psychological Services: [place]  [phone] [place]  [place], [place]. [address], [place], [place]: [phone] or [phone], [employer]uy  Dental Assistance  If unable to pay or uninsured, contact:  Health Serve or [facility]. to become qualified for the adult dental clinic.  Patients with Medicaid: [facility] [phone] [address], [place] [address], [phone]  If unable to pay, or uninsured, contact [facility] [phone]) or [place] [phone] in [place], [place] in [facility]) to become qualified for the adult dental clinic   Other [place]- [place], [place], [place], [address], [place], [place], 2nd and 4th Thursday of the month at 6:30am.  10 clients each day by appointment, can sometimes see walk-in patients if someone does not show for an appointment. [facility]- [address] Danker [place], [place], [address], [place], [place], [place], [address], [place] Department- [phone] [place] [facility] [phone]
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
## 2018-12-08 NOTE — ED Provider Notes (Addendum)
[facility] DEPT Provider Note   CSN: [id number] Arrival date & time: 12/08/18  0700     History   Chief Complaint Chief Complaint  Patient presents with  . Fall  . Back Pain  . Headache    HPI Thomas Hunter is a 32 y.o. male presented today for headache and back pain after fall that occurred last night.  Patient states that he was drinking heavily last night when his fall occurred.  He does not remember falling or injuring himself.  Patient states that when he woke up this morning he felt sore and called EMS from a local [place].  At time of arrival patient is well-appearing and in no acute distress.  No slurred speech and normal neurologic exam.  Patient endorses a mild occipital headache that is constant, throbbing and worsened with palpation of the scalp.  Additionally patient endorses left-sided lower back pain, constant, mild intensity, throbbing and worsened with movement and palpation.  Patient is alert and oriented x3, denies SI/HI, denies hallucinations or other concerns at this time. Denies blood thinner use.  Denies nausea/vomiting, tremor, daily EtOH use, history of delirium tremens, agitation or additional concerns at this time.   Patient has not had medications for his pain prior to arrival.  EMS noted patient with normal vital signs, ambulatory and CBG of 103.  HPI  Past Medical History:  Diagnosis Date  . Anxiety   . Depression   . ETOH abuse   . GERD (gastroesophageal reflux disease)   . Gonorrhea 2016    Patient Active Problem List   Diagnosis Date Noted  . 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 Alcohol dependence with uncomplicated withdrawal ([place]) 03/06/2016  . Alcohol abuse 09/26/2015  . Generalized anxiety disorder 07/04/2013  . 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 Self-mutilation 07/04/2013    History reviewed. No pertinent surgical history.      Home Medications    Prior to Admission  medications   Medication Sig Start Date End Date Taking? Authorizing Provider  acetaminophen (TYLENOL) 500 MG tablet Take 1,000 mg by mouth every 6 (six) hours as needed for headache.    [provider]  Calcium Carbonate Antacid (TUMS PO) Take 2 tablets by mouth 3 (three) times daily as needed (heartburn).    [provider]  citalopram (CELEXA) 10 MG tablet Take 1 tablet (10 mg total) by mouth daily. 03/07/16   Niel Hummer, NP  nicotine (NICODERM CQ - DOSED IN MG/24 HOURS) 21 mg/24hr patch Place 1 patch (21 mg total) onto the skin daily. 03/07/16   Niel Hummer, NP  thiamine 100 MG tablet Take 1 tablet (100 mg total) by mouth daily. 03/07/16   Niel Hummer, NP  traZODone (DESYREL) 50 MG tablet Take 1 tablet (50 mg total) by mouth at bedtime and may repeat dose one time if needed. 03/07/16   Niel Hummer, NP    Family History Family History  Problem Relation Age of Onset  . Hypertension Other   . Diabetes Other     Social History Social History   Tobacco Use  . Smoking status: Current Every Day Smoker    Packs/day: 1.00    Types: Cigarettes  Substance Use Topics  . Alcohol use: Yes    Comment: 1 pink per day   . Drug use: Yes    Types: Marijuana, Cocaine     Allergies   Patient has no known allergies.   Review of Systems  Review of Systems  Constitutional: Negative.  Negative for chills and fever.  Gastrointestinal: Negative.  Negative for abdominal pain, nausea and vomiting.  Musculoskeletal: Positive for back pain. Negative for neck pain.  Skin: Negative.  Negative for wound.  Neurological: Negative.  Negative for dizziness, syncope, weakness, light-headedness, numbness and headaches.       Denies saddle area paresthesias Denies bowel/bladder incontinence  Psychiatric/Behavioral: Negative.  Negative for confusion, hallucinations, self-injury and suicidal ideas.    Physical Exam Updated Vital Signs BP 124/78 (BP Location: Right Arm)   Pulse  97   Temp 98.3 F (36.8 C) (Oral)   Resp 18   SpO2 98%   Physical Exam Constitutional:      General: He is not in acute distress.    Appearance: He is well-developed. He is not ill-appearing or diaphoretic.  HENT:     Head: Normocephalic and atraumatic. No raccoon eyes, Battle's sign, abrasion or contusion.     Jaw: There is normal jaw occlusion. No trismus.      Comments: Patient with tenderness of the right occipital scalp, no sign of injury present.    Right Ear: Hearing, tympanic membrane, ear canal and external ear normal. No hemotympanum.     Left Ear: Hearing, tympanic membrane, ear canal and external ear normal. No hemotympanum.     Nose: Nose normal.     Mouth/Throat:     Lips: Pink.     Mouth: Mucous membranes are moist.     Pharynx: Oropharynx is clear. Uvula midline.  Eyes:     General: Vision grossly intact. Gaze aligned appropriately.     Extraocular Movements: Extraocular movements intact.     Conjunctiva/sclera: Conjunctivae normal.     Pupils: Pupils are equal, round, and reactive to light.     Comments: Visual fields grossly intact bilaterally  Neck:     Musculoskeletal: Full passive range of motion without pain, normal range of motion and neck supple. No spinous process tenderness or muscular tenderness.     Trachea: Trachea and phonation normal. No tracheal deviation.  Cardiovascular:     Rate and Rhythm: Normal rate and regular rhythm.     Pulses:          Dorsalis pedis pulses are 2+ on the right side and 2+ on the left side.       Posterior tibial pulses are 2+ on the right side and 2+ on the left side.     Heart sounds: Normal heart sounds.  Pulmonary:     Effort: Pulmonary effort is normal. No respiratory distress.     Breath sounds: Normal breath sounds and air entry. No decreased air movement.  Chest:     Chest wall: No deformity, tenderness or crepitus.  Abdominal:     General: Bowel sounds are normal.     Palpations: Abdomen is soft.      Tenderness: There is no abdominal tenderness. There is no guarding or rebound.  Musculoskeletal: Normal range of motion.       Back:     Right lower leg: Normal.     Left lower leg: Normal.     Comments: No midline C/T/L spinal tenderness to palpation, no deformity, crepitus, or step-off noted.  With mild left-sided paraspinal tenderness to palpation.  No sign of injury to the neck or back.  Hips stable to compression bilaterally.  Patient is able to pull knee-to-chest without difficulty or increase in pain.  He is ambulatory without assistance.  All major joints  palpated and brought through range of motion without difficulty, crepitus or deformity.  Feet:     Right foot:     Protective Sensation: 3 sites tested. 3 sites sensed.     Left foot:     Protective Sensation: 3 sites tested. 3 sites sensed.  Skin:    General: Skin is warm and dry.     Capillary Refill: Capillary refill takes less than 2 seconds.  Neurological:     Mental Status: He is alert and oriented to person, place, and time.     GCS: GCS eye subscore is 4. GCS verbal subscore is 5. GCS motor subscore is 6.     Comments: Mental Status: Alert, oriented, thought content appropriate, able to give a coherent history. Speech fluent without evidence of aphasia. Able to follow 2 step commands without difficulty. Cranial Nerves: II: Peripheral visual fields grossly normal, pupils equal, round, reactive to light III,IV, VI: ptosis not present, extra-ocular motions intact bilaterally V,VII: smile symmetric, eyebrows raise symmetric, facial light touch sensation equal VIII: hearing grossly normal to voice X: uvula elevates symmetrically XI: bilateral shoulder shrug symmetric and strong XII: midline tongue extension without fassiculations Motor: Normal tone. 5/5 strength in upper and lower extremities bilaterally including strong and equal grip strength and dorsiflexion/plantar flexion Sensory: Sensation intact to light  touch in all extremities.Negative Romberg.  Cerebellar: normal finger-to-nose with bilateral upper extremities. Normal heel-to -shin balance bilaterally of the lower extremity. No pronator drift.  Gait: normal gait and balance CV: distal pulses palpable throughout  Psychiatric:        Attention and Perception: Attention normal.        Mood and Affect: Mood normal.        Speech: Speech normal.        Behavior: Behavior normal. Behavior is cooperative.        Thought Content: Thought content does not include homicidal or suicidal ideation.        Cognition and Memory: Cognition normal.    ED Treatments / Results  Labs (all labs ordered are listed, but only abnormal results are displayed) Labs Reviewed - No data to display  EKG None  Radiology Dg Lumbar Spine Complete  Result Date: 12/08/2018 CLINICAL DATA:  Recent fall. EXAM: LUMBAR SPINE - COMPLETE 4+ VIEW COMPARISON:  CT 09/25/2015 FINDINGS: Normal alignment of lumbar spine. The vertebral body heights and disc spaces are maintained. Negative for a pars defect. Normal appearance of the SI joints. IMPRESSION: Normal lumbar spine study. Electronically Signed   By: Markus Daft M.D.   On: 12/08/2018 08:17   Ct Head Wo Contrast  Result Date: 12/08/2018 CLINICAL DATA:  32 year old who states that he was drinking last night and fell. RIGHT-sided headache. Patient denies loss of consciousness. Initial encounter. EXAM: CT HEAD WITHOUT CONTRAST CT CERVICAL SPINE WITHOUT CONTRAST TECHNIQUE: Multidetector CT imaging of the head and cervical spine was performed following the standard protocol without intravenous contrast. Multiplanar CT image reconstructions of the cervical spine were also generated. COMPARISON:  CT head and cervical spine 09/25/2015. CT head 11/14/2013. FINDINGS: CT HEAD FINDINGS Brain: Ventricular system normal in size and appearance for age. No mass lesion. No midline shift. No acute hemorrhage or hematoma. No extra-axial fluid  collections. No evidence of acute infarction. No focal brain parenchymal abnormalities. Vascular: No hyperdense vessel. No visible atherosclerosis. Skull: No skull fracture or other focal osseous abnormality involving the skull. Remote BILATERAL nasal bone fractures are noted. Sinuses/Orbits: Visualized paranasal sinuses, bilateral mastoid air  cells and bilateral middle ear cavities well-aerated. Bony nasal septal deviation to the RIGHT as noted previously. Visualized orbits and globes normal in appearance. Other: None. CT CERVICAL SPINE FINDINGS Alignment: Anatomic POSTERIOR alignment. Facet joints anatomically aligned without significant degenerative change. Skull base and vertebrae: No fractures identified involving the cervical spine. Coronal reformatted images demonstrate an intact craniocervical junction, intact dens and intact lateral masses throughout. Soft tissues and spinal canal: No evidence of paraspinous or spinal canal hematoma. No evidence of spinal stenosis. Disc levels: Disc spaces well-preserved throughout. Calcification in the POSTERIOR annular fibers at C5-6 without evidence of disc protrusion. Neural foramina widely patent at every level. Upper chest: Visualized lung apices clear. Visualized superior mediastinum normal. Other: None. IMPRESSION: 1. Normal intracranially. 2. No cervical spine fractures identified. Electronically Signed   By: Evangeline Dakin M.D.   On: 12/08/2018 08:40   Ct Cervical Spine Wo Contrast  Result Date: 12/08/2018 CLINICAL DATA:  32 year old who states that he was drinking last night and fell. RIGHT-sided headache. Patient denies loss of consciousness. Initial encounter. EXAM: CT HEAD WITHOUT CONTRAST CT CERVICAL SPINE WITHOUT CONTRAST TECHNIQUE: Multidetector CT imaging of the head and cervical spine was performed following the standard protocol without intravenous contrast. Multiplanar CT image reconstructions of the cervical spine were also generated.  COMPARISON:  CT head and cervical spine 09/25/2015. CT head 11/14/2013. FINDINGS: CT HEAD FINDINGS Brain: Ventricular system normal in size and appearance for age. No mass lesion. No midline shift. No acute hemorrhage or hematoma. No extra-axial fluid collections. No evidence of acute infarction. No focal brain parenchymal abnormalities. Vascular: No hyperdense vessel. No visible atherosclerosis. Skull: No skull fracture or other focal osseous abnormality involving the skull. Remote BILATERAL nasal bone fractures are noted. Sinuses/Orbits: Visualized paranasal sinuses, bilateral mastoid air cells and bilateral middle ear cavities well-aerated. Bony nasal septal deviation to the RIGHT as noted previously. Visualized orbits and globes normal in appearance. Other: None. CT CERVICAL SPINE FINDINGS Alignment: Anatomic POSTERIOR alignment. Facet joints anatomically aligned without significant degenerative change. Skull base and vertebrae: No fractures identified involving the cervical spine. Coronal reformatted images demonstrate an intact craniocervical junction, intact dens and intact lateral masses throughout. Soft tissues and spinal canal: No evidence of paraspinous or spinal canal hematoma. No evidence of spinal stenosis. Disc levels: Disc spaces well-preserved throughout. Calcification in the POSTERIOR annular fibers at C5-6 without evidence of disc protrusion. Neural foramina widely patent at every level. Upper chest: Visualized lung apices clear. Visualized superior mediastinum normal. Other: None. IMPRESSION: 1. Normal intracranially. 2. No cervical spine fractures identified. Electronically Signed   By: Evangeline Dakin M.D.   On: 12/08/2018 08:40    Procedures Procedures (including critical care time)  Medications Ordered in ED Medications  ibuprofen (ADVIL,MOTRIN) tablet 600 mg (600 mg Oral Given 12/08/18 0846)     Initial Impression / Assessment and Plan / ED Course  I have reviewed the triage  vital signs and the nursing notes.  Pertinent labs & imaging results that were available during my care of the patient were reviewed by me and considered in my medical decision making (see chart for details).    32 year old male with history of alcohol abuse presenting today after fall that occurred last night.   DG lumbar spine negative CT head negative CT cervical spine nonacute  No neurological deficits and normal neuro exam.  Patient is ambulatory without assistance or difficulty.  Patient denies loss of bowel/bladder control or saddle area paresthesias.  No concern for cauda  equina.    Patient without signs of serious head, neck, or back injury; no midline spinal tenderness or tenderness to palpation of the chest or abdomen. Normal neurological exam. No concern for closed head injury, lung injury, or intraabdominal injury. It is likely that the patient is experiencing normal muscle soreness after fall last night.  Patient without chest pain or shortness of breath, no lightheadedness on standing in ED today.  Suspect patient's alcohol intoxication last night to be because of his fall.  Pt has been instructed to follow up with their PCP regarding their visit today. Home conservative therapies for pain including ice and heat tx have been discussed. Pt is hemodynamically stable, not in acute distress & able to ambulate in the ED. Patient denies suicidal or homicidal ideations.  Patient is alert and oriented, he does not appear to be intoxicated on my evaluation and does not appear to be a danger to himself or others.  Patient given resource guide for substance abuse. No signs of withdrawal or DT.  Patient given ibuprofen here for pain.  Patient denies history of CKD or gastric ulcers/bleeding.  Patient endorses improvement of back pain and headache following ibuprofen.  At this time there does not appear to be any evidence of an acute emergency medical condition and the patient appears stable for  discharge with appropriate outpatient follow up. Diagnosis was discussed with patient who verbalizes understanding of care plan and is agreeable to discharge. I have discussed return precautions with patient who verbalizes understanding of return precautions. Patient strongly encouraged to follow-up with their PCP next week. All questions answered.  Patient's case discussed with Dr. Billy Fischer who agrees with plan to discharge with follow-up.   Note: Portions of this report may have been transcribed using voice recognition software. Every effort was made to ensure accuracy; however, inadvertent computerized transcription errors may still be present. Final Clinical Impressions(s) / ED Diagnoses   Final diagnoses:  Fall, initial encounter  Nonintractable headache, unspecified chronicity pattern, unspecified headache type  Musculoskeletal back pain    ED Discharge Orders    None       Deliah Boston, PA-C 12/08/18 0950    Deliah Boston, PA-C 12/08/18 1833    Gareth Morgan, MD 12/09/18 1031

## 2018-12-08 NOTE — ED Notes (Signed)
Bed: WA03 Expected date:  Expected time:  Means of arrival:  Comments: EMS 32 yo back pain/patient fell after drinking last night CBG 103

## 2018-12-22 ENCOUNTER — Encounter (HOSPITAL_COMMUNITY): Payer: Self-pay

## 2018-12-22 ENCOUNTER — Emergency Department (HOSPITAL_COMMUNITY): Payer: Self-pay

## 2018-12-22 ENCOUNTER — Other Ambulatory Visit: Payer: Self-pay

## 2018-12-22 ENCOUNTER — Emergency Department (HOSPITAL_COMMUNITY)
Admission: EM | Admit: 2018-12-22 | Discharge: 2018-12-23 | Disposition: A | Discharge status: Home / Self Care | Payer: Self-pay | Attending: Emergency Medicine | Admitting: Emergency Medicine

## 2018-12-22 DIAGNOSIS — F332 Major depressive disorder, recurrent severe without psychotic features: Secondary | ICD-10-CM | POA: Insufficient documentation

## 2018-12-22 DIAGNOSIS — Z79899 Other long term (current) drug therapy: Secondary | ICD-10-CM | POA: Insufficient documentation

## 2018-12-22 DIAGNOSIS — F1910 Other psychoactive substance abuse, uncomplicated: Secondary | ICD-10-CM

## 2018-12-22 DIAGNOSIS — F17210 Nicotine dependence, cigarettes, uncomplicated: Secondary | ICD-10-CM | POA: Insufficient documentation

## 2018-12-22 DIAGNOSIS — F339 Major depressive disorder, recurrent, unspecified: Secondary | ICD-10-CM

## 2018-12-22 LAB — CBC WITH DIFFERENTIAL/PLATELET
Abs Immature Granulocytes: 0.03 10*3/uL (ref 0.00–0.07)
BASOS ABS: 0.1 10*3/uL (ref 0.0–0.1)
Basophils Relative: 1 %
EOS ABS: 0.1 10*3/uL (ref 0.0–0.5)
EOS PCT: 1 %
HEMATOCRIT: 50.2 % (ref 39.0–52.0)
HEMOGLOBIN: 16.7 g/dL (ref 13.0–17.0)
IMMATURE GRANULOCYTES: 0 %
LYMPHS ABS: 2.3 10*3/uL (ref 0.7–4.0)
LYMPHS PCT: 26 %
MCH: 31.7 pg (ref 26.0–34.0)
MCHC: 33.3 g/dL (ref 30.0–36.0)
MCV: 95.4 fL (ref 80.0–100.0)
MONOS PCT: 8 %
Monocytes Absolute: 0.7 10*3/uL (ref 0.1–1.0)
NEUTROS ABS: 5.7 10*3/uL (ref 1.7–7.7)
NEUTROS PCT: 64 %
Platelets: 282 10*3/uL (ref 150–400)
RBC: 5.26 MIL/uL (ref 4.22–5.81)
RDW: 12.8 % (ref 11.5–15.5)
WBC: 8.9 10*3/uL (ref 4.0–10.5)
nRBC: 0 % (ref 0.0–0.2)

## 2018-12-22 LAB — COMPREHENSIVE METABOLIC PANEL
ALBUMIN: 4.8 g/dL (ref 3.5–5.0)
ALT: 19 U/L (ref 0–44)
AST: 20 U/L (ref 15–41)
Alkaline Phosphatase: 43 U/L (ref 38–126)
Anion gap: 13 (ref 5–15)
BUN: 11 mg/dL (ref 6–20)
CHLORIDE: 101 mmol/L (ref 98–111)
CO2: 21 mmol/L — AB (ref 22–32)
Calcium: 9.4 mg/dL (ref 8.9–10.3)
Creatinine, Ser: 0.85 mg/dL (ref 0.61–1.24)
GFR calc Af Amer: >60 mL/min (ref >60)
GFR calc non Af Amer: >60 mL/min (ref >60)
GLUCOSE: 95 mg/dL (ref 70–99)
Potassium: 4 mmol/L (ref 3.5–5.1)
Sodium: 135 mmol/L (ref 135–145)
Total Bilirubin: 1.7 mg/dL — ABNORMAL HIGH (ref 0.3–1.2)
Total Protein: 7.6 g/dL (ref 6.5–8.1)

## 2018-12-22 LAB — RAPID URINE DRUG SCREEN, HOSP PERFORMED
Amphetamines: NOT DETECTED
BARBITURATES: NOT DETECTED
BENZODIAZEPINES: NOT DETECTED
COCAINE: POSITIVE — AB
Opiates: NOT DETECTED
TETRAHYDROCANNABINOL: POSITIVE — AB

## 2018-12-22 LAB — I-STAT TROPONIN, ED: TROPONIN I, POC: 0 ng/mL (ref 0.00–0.08)

## 2018-12-22 LAB — ETHANOL: Alcohol, Ethyl (B): <10 mg/dL (ref <10)

## 2018-12-22 MED ORDER — THIAMINE HCL 100 MG/ML IJ SOLN: 100.0000 mg | Freq: Every day | INTRAMUSCULAR | Status: DC

## 2018-12-22 MED ORDER — GABAPENTIN 100 MG PO CAPS: 200.0000 mg | ORAL_CAPSULE | Freq: Three times a day (TID) | ORAL | 0 refills | Status: DC

## 2018-12-22 MED ORDER — LORAZEPAM 2 MG/ML IJ SOLN: 0.0000 mg | Freq: Two times a day (BID) | INTRAMUSCULAR | Status: DC

## 2018-12-22 MED ORDER — NICOTINE 21 MG/24HR TD PT24
21.0000 mg | MEDICATED_PATCH | Freq: Every day | TRANSDERMAL | Status: DC
  Administered 2018-12-22: 21 mg via TRANSDERMAL
  Filled 2018-12-22: qty 1

## 2018-12-22 MED ORDER — GABAPENTIN 100 MG PO CAPS
200.0000 mg | ORAL_CAPSULE | Freq: Three times a day (TID) | ORAL | Status: DC
  Administered 2018-12-22 (×2): 200 mg via ORAL
  Filled 2018-12-22 (×2): qty 2

## 2018-12-22 MED ORDER — VITAMIN B-1 100 MG PO TABS
100.0000 mg | ORAL_TABLET | Freq: Every day | ORAL | Status: DC
  Administered 2018-12-22: 100 mg via ORAL
  Filled 2018-12-22: qty 1

## 2018-12-22 MED ORDER — ACETAMINOPHEN 325 MG PO TABS: 650.0000 mg | ORAL_TABLET | ORAL | Status: DC | PRN

## 2018-12-22 MED ORDER — HYDROXYZINE HCL 25 MG PO TABS: 25.0000 mg | ORAL_TABLET | Freq: Three times a day (TID) | ORAL | Status: DC | PRN

## 2018-12-22 MED ORDER — LORAZEPAM 1 MG PO TABS: 0.0000 mg | ORAL_TABLET | Freq: Two times a day (BID) | ORAL | Status: DC

## 2018-12-22 MED ORDER — ZOLPIDEM TARTRATE 5 MG PO TABS: 5.0000 mg | ORAL_TABLET | Freq: Every evening | ORAL | Status: DC | PRN

## 2018-12-22 MED ORDER — HYDROXYZINE HCL 25 MG PO TABS
25.0000 mg | ORAL_TABLET | Freq: Once | ORAL | Status: AC
  Administered 2018-12-22: 25 mg via ORAL
  Filled 2018-12-22: qty 1

## 2018-12-22 MED ORDER — ONDANSETRON HCL 4 MG PO TABS: 4.0000 mg | ORAL_TABLET | Freq: Three times a day (TID) | ORAL | Status: DC | PRN

## 2018-12-22 MED ORDER — LORAZEPAM 2 MG/ML IJ SOLN: 0.0000 mg | Freq: Four times a day (QID) | INTRAMUSCULAR | Status: DC

## 2018-12-22 MED ORDER — HYDROXYZINE HCL 25 MG PO TABS: 25.0000 mg | ORAL_TABLET | Freq: Three times a day (TID) | ORAL | 0 refills | Status: DC | PRN

## 2018-12-22 MED ORDER — LORAZEPAM 1 MG PO TABS
0.0000 mg | ORAL_TABLET | Freq: Four times a day (QID) | ORAL | Status: DC
  Administered 2018-12-22: 1 mg via ORAL
  Filled 2018-12-22: qty 1

## 2018-12-22 MED ORDER — ALUM & MAG HYDROXIDE-SIMETH 200-200-20 MG/5ML PO SUSP: 30.0000 mL | Freq: Four times a day (QID) | ORAL | Status: DC | PRN

## 2018-12-22 NOTE — ED Triage Notes (Addendum)
Pt reports having a " drug and drinking problem " pt states he drinks about 1 pint of liqour/beer and smokes crack  Last drink and drug usage was last night ; pt denies any CP or SOB ; pt also reports feeling  SI last night and was going to attempt to " cut myself" but denies any Further SI at this time

## 2018-12-22 NOTE — BH Assessment (Addendum)
Assessment Note  Thomas Hunter is an 33 y.o. male that presents this date voluntary with S/I. Patient voices intent although denies any specific plan. Patient reports one prior attempt at self harm in 2016 when he cut his wrist (scars are visible to left wrist) and was admitted inpatient at [facility] in [place] where he resided at the time. Patient states he was diagnosed then with depression although did not follow up with OP care. Patient reports since then he has seen several PCP's that have prescribed medications to assist with symptom management. Patient reports he has not been on any medications in over two years. Patient reports a history of cutting with last injury 5 years ago as visible scars are noted to left bicep. Patient states he has been residing in [place] for over two weeks recently relocating to be with his girlfriend. Patient reports that relationship "didn't work out" which brought on thoughts of self harm. Patient reports he is currently homeless. Patient reports ongoing SA issues to include daily alcohol use for the last two months stating he has been consuming 1 pint of liquor daily with last use on 12/21/18 when he had "a few beers and some liquor." Patient reports daily cocaine use (1 gram or more) for the last week and daily Cannabis use for the last month. Patient reports amounts vary. Patent states he is currently experiencing  withdrawals to include tremors and agitation. Per notes, patient has a history of polysubstance abuse presenting today requesting help with SA issues and S/I. Recently had been having increased depression and anxiety for the past several weeks. Patient's UDS is positive for cocaine and cannabis. Last night he had thoughts of harming himself including cutting his wrist. States that he has attempt to harm himself in the past. He denies any homicidal ideation denies any auditory or visual hallucination.  He does not like to take his psychiatric  medication because he does not like the way he feels. Case was staffed with Reita Cliche DNP who recommended patient be observed for safety.     Diagnosis: F33.2 MDD recurrent without psychotic features, severe, Polysubstance use   Past Medical History:  Past Medical History:  Diagnosis Date  . Anxiety   . Depression   . ETOH abuse   . GERD (gastroesophageal reflux disease)   . Gonorrhea 2016    History reviewed. No pertinent surgical history.  Family History:  Family History  Problem Relation Age of Onset  . Hypertension Other   . Diabetes Other     Social History:  reports that he has been smoking cigarettes. He has been smoking about 0.50 packs per day. He has never used smokeless tobacco. He reports current alcohol use. He reports current drug use. Drugs: Marijuana and Cocaine.  Additional Social History:  Alcohol / Drug Use Pain Medications: None  Prescriptions: See PTA list Over the Counter: None  History of alcohol / drug use?: Yes Longest period of sobriety (when/how long): Unknown Negative Consequences of Use: Financial, Legal Withdrawal Symptoms: Agitation, Weakness, Sweats Substance #1 Name of Substance 1: Cocaine 1 - Age of First Use: 24 1 - Amount (size/oz): Varies 1 - Frequency: Varies 1 - Duration: Ongoing 1 - Last Use / Amount: 12/21/18 1 gram Substance #2 Name of Substance 2: Cannabis 2 - Age of First Use: 15 2 - Amount (size/oz): 1 joint 2 - Frequency: 2 x week 2 - Duration: since 33 yo 2 - Last Use / Amount: 12/21/18 1 gram  Substance #3 Name of Substance 3: Nicotine 3 - Age of First Use: 14 3 - Amount (size/oz): 1- 1 1/2 pack 3 - Frequency: daily 3 - Duration: Ongoing 3 - Last Use / Amount: 12/21/18 Substance #4 Name of Substance 4: Alcohol 4 - Age of First Use: 18 4 - Amount (size/oz): Varies 4 - Frequency: Varies 4 - Duration: Ongoing 4 - Last Use / Amount: 12/21/18 1 pint of liquor  CIWA: CIWA-Ar BP: (!) 132/96 Pulse Rate: 93 Nausea and  Vomiting: no nausea and no vomiting Tactile Disturbances: none Tremor: three Auditory Disturbances: not present Paroxysmal Sweats: no sweat visible Visual Disturbances: very mild sensitivity Anxiety: two Headache, Fullness in Head: mild Agitation: normal activity Orientation and Clouding of Sensorium: oriented and can do serial additions CIWA-Ar Total: 8 COWS:    Allergies: No Known Allergies  Home Medications: (Not in a hospital admission)   OB/GYN Status:  No LMP for male patient.  General Assessment Data Assessment unable to be completed: Yes Reason for not completing assessment: pt in hall bed, ED will let [facility] know when he is moved Location of Assessment: [facility] ED TTS Assessment: In system Is this a Tele or Face-to-Face Assessment?: Face-to-Face Is this an Initial Assessment or a Re-assessment for this encounter?: Initial Assessment Patient Accompanied by:: N/A Language Other than English: No Living Arrangements: Homeless/Shelter What gender do you identify as?: Male Marital status: Single Living Arrangements: Alone Can pt return to current living arrangement?: Yes Admission Status: Voluntary Is patient capable of signing voluntary admission?: Yes Referral Source: Self/Family/Friend Insurance type: Self Pay     Crisis Care Plan Living Arrangements: Alone Legal Guardian: (NA) Name of Psychiatrist: None Name of Therapist: None  Education Status Is patient currently in school?: No Is the patient employed, unemployed or receiving disability?: Unemployed  Risk to self with the past 6 months Suicidal Ideation: Yes-Currently Present Has patient been a risk to self within the past 6 months prior to admission? : No Suicidal Intent: Yes-Currently Present Has patient had any suicidal intent within the past 6 months prior to admission? : No Is patient at risk for suicide?: Yes Suicidal Plan?: No Has patient had any suicidal plan within the past 6 months prior to  admission? : No Access to Means: No What has been your use of drugs/alcohol within the last 12 months?: Current use Previous Attempts/Gestures: Yes How many times?: 1 Other Self Harm Risks: (Excessive SA use) Triggers for Past Attempts: Unknown Intentional Self Injurious Behavior: Cutting Comment - Self Injurious Behavior: Hx of 5 years ago Family Suicide History: No Recent stressful life event(s): Other (Comment)(Job loss ) Persecutory voices/beliefs?: No Depression: Yes Depression Symptoms: Isolating, Feeling worthless/self pity Substance abuse history and/or treatment for substance abuse?: Yes Suicide prevention information given to non-admitted patients: Not applicable  Risk to Others within the past 6 months Homicidal Ideation: No Does patient have any lifetime risk of violence toward others beyond the six months prior to admission? : No Thoughts of Harm to Others: No Current Homicidal Intent: No Current Homicidal Plan: No Access to Homicidal Means: No Identified Victim: NA History of harm to others?: No Assessment of Violence: None Noted Violent Behavior Description: NA Does patient have access to weapons?: No Criminal Charges Pending?: Yes Describe Pending Criminal Charges: Possession Does patient have a court date: Yes Court Date: 01/08/19 Is patient on probation?: No  Psychosis Hallucinations: None noted Delusions: None noted  Mental Status Report Appearance/Hygiene: In scrubs Eye Contact: Fair Motor Activity: Freedom  of movement Speech: Logical/coherent Level of Consciousness: Quiet/awake Mood: Depressed Affect: Appropriate to circumstance Anxiety Level: Minimal Thought Processes: Coherent, Relevant Judgement: Partial Orientation: Person, Place, Time Obsessive Compulsive Thoughts/Behaviors: None  Cognitive Functioning Concentration: Normal Memory: Recent Intact, Remote Intact Is patient IDD: No Insight: Fair Impulse Control: Poor Appetite:  Fair Have you had any weight changes? : No Change Sleep: No Change Total Hours of Sleep: 6 Vegetative Symptoms: None  ADLScreening [facility] Assessment Services) Patient's cognitive ability adequate to safely complete daily activities?: Yes Patient able to express need for assistance with ADLs?: Yes Independently performs ADLs?: Yes (appropriate for developmental age)  Prior Inpatient Therapy Prior Inpatient Therapy: Yes Prior Therapy Dates: 2016 Prior Therapy Facilty/Provider(s): [facility] Reason for Treatment: MH issues  Prior Outpatient Therapy Prior Outpatient Therapy: No Does patient have an ACCT team?: No Does patient have Intensive In-House Services?  : No Does patient have [facility] services? : No Does patient have P4CC services?: No  ADL Screening (condition at time of admission) Patient's cognitive ability adequate to safely complete daily activities?: Yes Is the patient deaf or have difficulty hearing?: No Does the patient have difficulty seeing, even when wearing glasses/contacts?: No Does the patient have difficulty concentrating, remembering, or making decisions?: No Patient able to express need for assistance with ADLs?: Yes Does the patient have difficulty dressing or bathing?: No Independently performs ADLs?: Yes (appropriate for developmental age) Does the patient have difficulty walking or climbing stairs?: No Weakness of Legs: None Weakness of Arms/Hands: None  Home Assistive Devices/Equipment Home Assistive Devices/Equipment: None  Therapy Consults (therapy consults require a physician order) PT Evaluation Needed: No OT Evalulation Needed: No SLP Evaluation Needed: No Abuse/Neglect Assessment (Assessment to be complete while patient is alone) Physical Abuse: Denies Verbal Abuse: Denies Sexual Abuse: Denies Exploitation of patient/patient's resources: Denies Self-Neglect: Denies Values / Beliefs Cultural Requests During Hospitalization: None Spiritual  Requests During Hospitalization: None Consults Spiritual Care Consult Needed: No Social Work Consult Needed: No Regulatory affairs officer (For Healthcare) Does Patient Have a Medical Advance Directive?: No Would patient like information on creating a medical advance directive?: No - Patient declined          Disposition: Case was staffed with Reita Cliche DNP who recommended patient be observed for safety.   Disposition Initial Assessment Completed for this Encounter: Yes Disposition of Patient: (Observe and monitor) Patient refused recommended treatment: No Mode of transportation if patient is discharged/movement?: (Unk)  On Site Evaluation by:   Reviewed with Physician:    Mamie Nick 12/22/2018 3:00 PM

## 2018-12-22 NOTE — BH Assessment (Signed)
Pt unable to be assessed in hall bed and will be moved to a room per ED RN. Thomas Hunter., TTS counselor will  Come to [name] and assess pt in person.

## 2018-12-22 NOTE — Progress Notes (Signed)
Patient is interested in rehab/recovery and denies current suicidal/homicidal ideations, hallucinations, and withdrawal symptoms.  The plan is for him to stabilize over night from his alcohol consumption and discharge in the am, patient is aware of the plan.  Waylan Boga, PMHNP

## 2018-12-22 NOTE — ED Notes (Signed)
Regular meal tray ordered for Pt at this time.

## 2018-12-22 NOTE — ED Provider Notes (Signed)
[place] EMERGENCY DEPARTMENT Provider Note   CSN: [id number] Arrival date & time: 12/22/18  0932     History   Chief Complaint Chief Complaint  Patient presents with  . Drug Problem    HPI Thomas Hunter is a 33 y.o. male.  The history is provided by the patient. No language interpreter was used.     33 year old male with hx of polysubstance abuse presenting today with requesting help with detox as well as having transient suicidal ideation.  Patient states he was staying [place] at a rehab facility for 6 months, recently moved down to [place] to be with his family.  Recently had been having increased depression and anxiety for the past several weeks.  Triggers may be due to family situation.  His girlfriend left the house for several weeks which makes him depressed  he admits to begins drinking heavily as well as using crack. states he drinks at least a pint of liquor with beer a day.  He is eating and sleeping less.  Endorsing body aches, such as pain in his chest and abdomen which is waxing waning, he feels dehydrated.  Last night he has thought of harming himself including cutting his wrist.  States that he has attempt to harm himself in the past.  He denies any homicidal ideation denies any auditory or visual hallucination.  He does not like to take his psychiatric medication because he does not like the way he feels.  Initially would like to go to [facility] to get help with rehab and detox but does not have appropriate transportation to make it happen.    Past Medical History:  Diagnosis Date  . Anxiety   . Depression   . ETOH abuse   . GERD (gastroesophageal reflux disease)   . Gonorrhea 2016    Patient Active Problem List   Diagnosis Date Noted  . 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 Alcohol dependence with uncomplicated withdrawal ([place]) 03/06/2016  . Alcohol abuse 09/26/2015  . Generalized anxiety disorder 07/04/2013  . Impulse  control disorder 07/04/2013  . Self-mutilation 07/04/2013    History reviewed. No pertinent surgical history.      Home Medications    Prior to Admission medications   Medication Sig Start Date End Date Taking? Authorizing Provider  acetaminophen (TYLENOL) 500 MG tablet Take 1,000 mg by mouth every 6 (six) hours as needed for headache.    [provider]  Calcium Carbonate Antacid (TUMS PO) Take 2 tablets by mouth 3 (three) times daily as needed (heartburn).    [provider]  citalopram (CELEXA) 10 MG tablet Take 1 tablet (10 mg total) by mouth daily. 03/07/16   Niel Hummer, NP  lidocaine (LIDODERM) 5 % Place 1 patch onto the skin daily. Remove & Discard patch within 12 hours or as directed by MD 12/08/18   Deliah Boston, PA-C  nicotine (NICODERM CQ - DOSED IN MG/24 HOURS) 21 mg/24hr patch Place 1 patch (21 mg total) onto the skin daily. 03/07/16   Niel Hummer, NP  thiamine 100 MG tablet Take 1 tablet (100 mg total) by mouth daily. 03/07/16   Niel Hummer, NP  traZODone (DESYREL) 50 MG tablet Take 1 tablet (50 mg total) by mouth at bedtime and may repeat dose one time if needed. 03/07/16   Niel Hummer, NP    Family History Family History  Problem Relation Age of Onset  . Hypertension Other   .  Diabetes Other     Social History Social History   Tobacco Use  . Smoking status: Current Every Day Smoker    Packs/day: 0.50    Types: Cigarettes  . Smokeless tobacco: Never Used  Substance Use Topics  . Alcohol use: Yes    Comment: 1 pint per day   . Drug use: Yes    Types: Marijuana, Cocaine    Comment: crack     Allergies   Patient has no known allergies.   Review of Systems Review of Systems  All other systems reviewed and are negative.    Physical Exam Updated Vital Signs BP (!) 132/96 (BP Location: Right Arm)   Pulse 99   Temp 98.1 F (36.7 C) (Oral)   Resp 18   Ht 5\' 9"  (1.753 m)   Wt 68 kg   SpO2 100%   BMI 22.15 kg/m    Physical Exam Vitals signs and nursing note reviewed.  Constitutional:      General: He is not in acute distress.    Appearance: He is well-developed.  HENT:     Head: Atraumatic.  Eyes:     Conjunctiva/sclera: Conjunctivae normal.  Neck:     Musculoskeletal: Neck supple.  Cardiovascular:     Rate and Rhythm: Normal rate and regular rhythm.     Pulses: Normal pulses.     Heart sounds: Normal heart sounds.  Pulmonary:     Effort: Pulmonary effort is normal.     Breath sounds: Normal breath sounds. No wheezing or rales.  Abdominal:     Palpations: Abdomen is soft.     Tenderness: There is no abdominal tenderness.  Skin:    Findings: No rash.  Neurological:     Mental Status: He is alert and oriented to person, place, and time.     GCS: GCS eye subscore is 4. GCS verbal subscore is 5. GCS motor subscore is 6.  Psychiatric:        Mood and Affect: Mood normal.        Speech: Speech normal.        Behavior: Behavior is cooperative.        Thought Content: Thought content is not paranoid. Thought content includes suicidal ideation. Thought content does not include homicidal ideation.     Comments: Patient is calm and cooperative and in no acute discomfort.      ED Treatments / Results  Labs (all labs ordered are listed, but only abnormal results are displayed) Labs Reviewed  COMPREHENSIVE METABOLIC PANEL - Abnormal; Notable for the following components:      Result Value   CO2 21 (*)    Total Bilirubin 1.7 (*)    All other components within normal limits  RAPID URINE DRUG SCREEN, HOSP PERFORMED - Abnormal; Notable for the following components:   Cocaine POSITIVE (*)    Tetrahydrocannabinol POSITIVE (*)    All other components within normal limits  ETHANOL  CBC WITH DIFFERENTIAL/PLATELET  I-STAT TROPONIN, ED    EKG None  ED ECG REPORT   Date: 12/22/2018  Rate: 100  Rhythm: sinus tachycardia  QRS Axis: normal  Intervals: normal  ST/T Wave abnormalities: ST  elevations diffusely  Conduction Disutrbances:none  Narrative Interpretation:   Old EKG Reviewed: none available  I have personally reviewed the EKG tracing and agree with the computerized printout as noted.   Radiology Dg Chest 2 View  Result Date: 12/22/2018 CLINICAL DATA:  Chest pain EXAM: CHEST - 2 VIEW COMPARISON:  09/25/2015 chest radiograph. FINDINGS: Stable cardiomediastinal silhouette with normal heart size. No pneumothorax. No pleural effusion. Lungs appear clear, with no acute consolidative airspace disease and no pulmonary edema. Pectus excavatum deformity. IMPRESSION: No active cardiopulmonary disease. Electronically Signed   By: Ilona Sorrel M.D.   On: 12/22/2018 12:30    Procedures Procedures (including critical care time)  Medications Ordered in ED Medications  acetaminophen (TYLENOL) tablet 650 mg (has no administration in time range)  alum & mag hydroxide-simeth (MAALOX/MYLANTA) 200-200-20 MG/5ML suspension 30 mL (has no administration in time range)  ondansetron (ZOFRAN) tablet 4 mg (has no administration in time range)  nicotine (NICODERM CQ - dosed in mg/24 hours) patch 21 mg (21 mg Transdermal Patch Applied 12/22/18 1337)  hydrOXYzine (ATARAX/VISTARIL) tablet 25 mg (has no administration in time range)  gabapentin (NEURONTIN) capsule 200 mg (has no administration in time range)  hydrOXYzine (ATARAX/VISTARIL) tablet 25 mg (25 mg Oral Given 12/22/18 1140)     Initial Impression / Assessment and Plan / ED Course  I have reviewed the triage vital signs and the nursing notes.  Pertinent labs & imaging results that were available during my care of the patient were reviewed by me and considered in my medical decision making (see chart for details).     BP (!) 132/96 (BP Location: Right Arm)   Pulse 93 Comment: Simultaneous filing. User may not have seen previous data.  Temp 98.1 F (36.7 C) (Oral)   Resp 18   Ht 5\' 9"  (1.753 m)   Wt 68 kg   SpO2 100% Comment:  Simultaneous filing. User may not have seen previous data.  BMI 22.15 kg/m    Final Clinical Impressions(s) / ED Diagnoses   Final diagnoses:  None    ED Discharge Orders         Ordered    hydrOXYzine (ATARAX/VISTARIL) 25 MG tablet  3 times daily PRN     12/22/18 1532    gabapentin (NEURONTIN) 100 MG capsule  3 times daily     12/22/18 1532         10:48 AM Patient here requesting to help with detox from crack and alcohol as well as having transient suicidal ideation with plan last night but none currently.  He did report chest discomfort.  Work-up initiated.  11:35 AM EKG shows ST elevation diffusely suggestive of acute pericarditis. No active CP at this time.   4:11 PM Pt is medically cleared, stable for further TTS evaluation.    Domenic Moras, PA-C 12/22/18 [place], [place], DO 12/22/18 1723

## 2018-12-22 NOTE — ED Notes (Signed)
Patient denies pain and is resting comfortably.  

## 2018-12-22 NOTE — ED Notes (Signed)
Pt transported to xray 

## 2018-12-23 NOTE — ED Notes (Addendum)
ALL belongings - 2 labeled belongings bags and 1 knife - returned to pt - Pt signed verifying all items present. Pt aware [place] unable to locate facility he and his mother are referring to re: rehab facility in [place].

## 2018-12-23 NOTE — ED Notes (Signed)
Telepsych being performed. 

## 2018-12-23 NOTE — Progress Notes (Signed)
The patient is seen by me via tele-psych and I have consulted with Dr. Dwyane Dee. Patient denies any suicidal or homicidal ideations and denies any hallucinations. Patient states that he plans to go to a [facility] in [place] that he is a long-term treatment patient's family.  Patient has 5 children and was told his children will go with him currently the children are staying with his girlfriend's mom. But he reports that the girlfriend's mom stated she was sending the children to CPS.  Patient stated he felt that he needed to get clean and sober so that he can take care of his children.  Patient states that he will be discharged with his mom and she is working on the arrangements for him to go to this facility in [place].  Patient was offered information on [facility], but patient refused treatment at that facility.  At this time patient does not meet inpatient criteria and is psychiatrically cleared.  I have contacted Suella Broad, PA-C and notified her of the recommendations.

## 2018-12-23 NOTE — ED Notes (Signed)
Pt has his mother's phone number she left of her spouse's.

## 2018-12-23 NOTE — ED Provider Notes (Signed)
  Physical Exam  BP 96/64 (BP Location: Right Arm)   Pulse 75   Temp 99.1 F (37.3 C) (Oral)   Resp 18   Ht 5\' 9"  (1.753 m)   Wt 68 kg   SpO2 98%   BMI 22.15 kg/m   Physical Exam  ED Course/Procedures     Procedures  MDM  Patient assessed by behavioral health, safe for dc with plan for mom to pick up. Patient plans to take himself to a [facility] facility that offers longer inpatient treatment. Patient is alert, ready for dc, no further needs or complaints at this time.        Tacy Learn, PA-C 12/23/18 1278    Daleen Bo, MD 12/24/18 1048

## 2018-12-23 NOTE — ED Notes (Signed)
Breakfast tray ordered 

## 2018-12-23 NOTE — Discharge Instructions (Addendum)
Follow-up with [facility] as planned.

## 2019-02-27 ENCOUNTER — Emergency Department (HOSPITAL_COMMUNITY)
Admission: EM | Admit: 2019-02-27 | Discharge: 2019-02-27 | Disposition: A | Discharge status: Home / Self Care | Payer: Self-pay | Attending: Emergency Medicine | Admitting: Emergency Medicine

## 2019-02-27 ENCOUNTER — Encounter (HOSPITAL_COMMUNITY): Payer: Self-pay | Admitting: Emergency Medicine

## 2019-02-27 ENCOUNTER — Other Ambulatory Visit: Payer: Self-pay

## 2019-02-27 DIAGNOSIS — Z79899 Other long term (current) drug therapy: Secondary | ICD-10-CM | POA: Insufficient documentation

## 2019-02-27 DIAGNOSIS — F17210 Nicotine dependence, cigarettes, uncomplicated: Secondary | ICD-10-CM | POA: Insufficient documentation

## 2019-02-27 DIAGNOSIS — B9789 Other viral agents as the cause of diseases classified elsewhere: Secondary | ICD-10-CM

## 2019-02-27 DIAGNOSIS — J069 Acute upper respiratory infection, unspecified: Secondary | ICD-10-CM | POA: Insufficient documentation

## 2019-02-27 LAB — INFLUENZA PANEL BY PCR (TYPE A & B)
Influenza A By PCR: NEGATIVE
Influenza B By PCR: NEGATIVE

## 2019-02-27 NOTE — ED Provider Notes (Addendum)
[facility] EMERGENCY DEPARTMENT Provider Note   CSN: [id number] Arrival date & time: 02/27/19  2017    History   Chief Complaint No chief complaint on file.   HPI Thomas Hunter is a 33 y.o. male.     33 year old male with past medical history below who presents with cough and congestion.  Yesterday morning, he began having cough associated with nasal congestion, sore throat, and low-grade fevers.  T-max 100.3 at home.  His cough has continued today but he states his sore throat has gotten better.  No medications prior to arrival.  No recent travel.  He notes that his sister works at a surgical center where a patient was tested for [place]. His sister is asymptomatic. He also notes that his son's babysitter has the flu, although patient's son is currently asymptomatic. No vomiting, diarrhea, or pain.   The history is provided by the patient.    Past Medical History:  Diagnosis Date  . Anxiety   . Depression   . ETOH abuse   . GERD (gastroesophageal reflux disease)   . Gonorrhea 2016    Patient Active Problem List   Diagnosis Date Noted  . 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 Alcohol dependence with uncomplicated withdrawal ([place]) 03/06/2016  . Alcohol abuse 09/26/2015  . Generalized anxiety disorder 07/04/2013  . 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 Self-mutilation 07/04/2013    History reviewed. No pertinent surgical history.      Home Medications    Prior to Admission medications   Medication Sig Start Date End Date Taking? Authorizing Provider  acetaminophen (TYLENOL) 500 MG tablet Take 1,000 mg by mouth every 6 (six) hours as needed for headache.   Yes [provider]  gabapentin (NEURONTIN) 100 MG capsule Take 2 capsules (200 mg total) by mouth 3 (three) times daily. 12/22/18  Yes Patrecia Pour, NP  guaiFENesin (MUCINEX) 600 MG 12 hr tablet Take 600 mg by mouth 2 (two) times daily.   Yes [provider]   hydrOXYzine (ATARAX/VISTARIL) 25 MG tablet Take 1 tablet (25 mg total) by mouth 3 (three) times daily as needed for anxiety. 12/22/18  Yes Patrecia Pour, NP  citalopram (CELEXA) 10 MG tablet Take 1 tablet (10 mg total) by mouth daily. Patient not taking: Reported on 12/22/2018 03/07/16   Elmarie Shiley A, NP  lidocaine (LIDODERM) 5 % Place 1 patch onto the skin daily. Remove & Discard patch within 12 hours or as directed by MD Patient not taking: Reported on 12/22/2018 12/08/18   Nuala Alpha A, PA-C  nicotine (NICODERM CQ - DOSED IN MG/24 HOURS) 21 mg/24hr patch Place 1 patch (21 mg total) onto the skin daily. Patient not taking: Reported on 12/22/2018 03/07/16   Niel Hummer, NP  thiamine 100 MG tablet Take 1 tablet (100 mg total) by mouth daily. Patient not taking: Reported on 12/22/2018 03/07/16   Niel Hummer, NP  traZODone (DESYREL) 50 MG tablet Take 1 tablet (50 mg total) by mouth at bedtime and may repeat dose one time if needed. Patient not taking: Reported on 12/22/2018 03/07/16   Niel Hummer, NP    Family History Family History  Problem Relation Age of Onset  . Hypertension Other   . Diabetes Other     Social History Social History   Tobacco Use  . Smoking status: Current Every Day Smoker    Packs/day: 0.50    Types: Cigarettes  . Smokeless tobacco: Never Used  Substance Use Topics  . Alcohol use: Yes    Comment: 1 pint per day   . Drug use: Yes    Types: Marijuana, Cocaine    Comment: crack     Allergies   Patient has no known allergies.   Review of Systems Review of Systems All other systems reviewed and are negative except that which was mentioned in HPI   Physical Exam Updated Vital Signs BP (!) 118/92   Pulse 81   Temp 98.9 F (37.2 C) (Oral)   Resp 17   SpO2 96%   Physical Exam Vitals signs and nursing note reviewed.  Constitutional:      General: He is not in acute distress.    Appearance: He is well-developed.  HENT:     Head:  Normocephalic and atraumatic.     Mouth/Throat:     Mouth: Mucous membranes are moist.     Comments: Mild erythema posterior oropharynx Eyes:     Conjunctiva/sclera: Conjunctivae normal.  Neck:     Musculoskeletal: Neck supple.  Cardiovascular:     Rate and Rhythm: Normal rate and regular rhythm.     Heart sounds: Normal heart sounds. No murmur.  Pulmonary:     Effort: Pulmonary effort is normal.     Breath sounds: Normal breath sounds. No wheezing or rales.  Abdominal:     General: Bowel sounds are normal. There is no distension.     Palpations: Abdomen is soft.     Tenderness: There is no abdominal tenderness.  Musculoskeletal:     Right lower leg: No edema.     Left lower leg: No edema.  Skin:    General: Skin is warm and dry.  Neurological:     Mental Status: He is alert and oriented to person, place, and time.     Comments: Fluent speech  Psychiatric:        Judgment: Judgment normal.      ED Treatments / Results  Labs (all labs ordered are listed, but only abnormal results are displayed) Labs Reviewed  INFLUENZA PANEL BY PCR (TYPE A & B)    EKG None  Radiology No results found.  Procedures Procedures (including critical care time)  Medications Ordered in ED Medications - No data to display   Initial Impression / Assessment and Plan / ED Course  I have reviewed the triage vital signs and the nursing notes.  Pertinent labs that were available during my care of the patient were reviewed by me and considered in my medical decision making (see chart for details).       Well-appearing, comfortable on exam with normal vital signs.  Normal O2 saturation, normal work of breathing, and clear breath sounds.  Afebrile without any interventions PTA.  Given his symptoms started yesterday and he has normal vital signs today, I do not feel he needs chest x-ray at this time. Per current hospital guidelines, he does not meet high risk exposure criteria to warrant COVID  testing currently. I have recommended self-quarantine until symptoms resolve. Influenza negative. I have emphasized the importance of staying home until symptoms completely resolve and extensively reviewed return precautions.  Final Clinical Impressions(s) / ED Diagnoses   Final diagnoses:  Viral URI with cough    ED Discharge Orders    None       , Wenda Overland, MD 02/27/19 2326    Rex Kras, Wenda Overland, MD 02/27/19 [phone]

## 2019-02-27 NOTE — ED Triage Notes (Signed)
Pt BIB [facility], c/o shortness of breath and fever starting last night. Denies hx asthma or seasonal allergies. Pt reports that his sister works at a surgical and a pt is being tested for COVID-9, pt's sister is asymptomatic. Pt also reports that his son has been staying with him since school got out, son's primary caregivers were influenza positive. VSS.

## 2019-02-27 NOTE — ED Notes (Signed)
Patient verbalizes understanding of discharge instructions. Opportunity for questioning and answers were provided. Armband removed by staff, pt discharged from ED ambulatory.   

## 2019-04-16 ENCOUNTER — Emergency Department (HOSPITAL_COMMUNITY)
Admission: EM | Admit: 2019-04-16 | Discharge: 2019-04-17 | Payer: Self-pay | Attending: Emergency Medicine | Admitting: Emergency Medicine

## 2019-04-16 ENCOUNTER — Emergency Department (HOSPITAL_COMMUNITY): Payer: Self-pay

## 2019-04-16 ENCOUNTER — Other Ambulatory Visit: Payer: Self-pay

## 2019-04-16 ENCOUNTER — Encounter (HOSPITAL_COMMUNITY): Payer: Self-pay | Admitting: Emergency Medicine

## 2019-04-16 DIAGNOSIS — S82134A Nondisplaced fracture of medial condyle of right tibia, initial encounter for closed fracture: Secondary | ICD-10-CM | POA: Insufficient documentation

## 2019-04-16 DIAGNOSIS — W03XXXA Other fall on same level due to collision with another person, initial encounter: Secondary | ICD-10-CM | POA: Insufficient documentation

## 2019-04-16 DIAGNOSIS — F1010 Alcohol abuse, uncomplicated: Secondary | ICD-10-CM | POA: Insufficient documentation

## 2019-04-16 DIAGNOSIS — S82141A Displaced bicondylar fracture of right tibia, initial encounter for closed fracture: Secondary | ICD-10-CM

## 2019-04-16 DIAGNOSIS — Y939 Activity, unspecified: Secondary | ICD-10-CM | POA: Insufficient documentation

## 2019-04-16 DIAGNOSIS — Z79899 Other long term (current) drug therapy: Secondary | ICD-10-CM | POA: Insufficient documentation

## 2019-04-16 DIAGNOSIS — F17210 Nicotine dependence, cigarettes, uncomplicated: Secondary | ICD-10-CM | POA: Insufficient documentation

## 2019-04-16 DIAGNOSIS — S0081XA Abrasion of other part of head, initial encounter: Secondary | ICD-10-CM | POA: Insufficient documentation

## 2019-04-16 DIAGNOSIS — Y999 Unspecified external cause status: Secondary | ICD-10-CM | POA: Insufficient documentation

## 2019-04-16 DIAGNOSIS — Y929 Unspecified place or not applicable: Secondary | ICD-10-CM | POA: Insufficient documentation

## 2019-04-16 MED ORDER — ACETAMINOPHEN 500 MG PO TABS
1000.0000 mg | ORAL_TABLET | Freq: Once | ORAL | Status: AC
  Administered 2019-04-16: 1000 mg via ORAL
  Filled 2019-04-16: qty 2

## 2019-04-16 MED ORDER — BACITRACIN ZINC 500 UNIT/GM EX OINT
TOPICAL_OINTMENT | Freq: Once | CUTANEOUS | Status: AC
  Administered 2019-04-16: 1 via TOPICAL
  Filled 2019-04-16: qty 0.9

## 2019-04-16 NOTE — ED Notes (Signed)
Bed: [facility]PT4 Expected date:  Expected time:  Means of arrival:  Comments: 

## 2019-04-16 NOTE — ED Notes (Signed)
Patient taken to xray.

## 2019-04-16 NOTE — ED Triage Notes (Signed)
Patient has a laceration on left side of forehead and knee pain. Patient needs to be medically cleared to be sent to jail.

## 2019-04-16 NOTE — ED Provider Notes (Signed)
[facility] DEPT Provider Note   CSN: [id number] Arrival date & time: 04/16/19  2256    History   Chief Complaint Chief Complaint  Patient presents with  . Medical Clearance    patient need to be medically clearance to go to jail    HPI Thomas Hunter is a 33 y.o. male past history of anxiety, depression, alcohol abuse, GERD brought in by [facility] police for evaluation and medical clearance.  Per police, patient was arrested for alcohol intoxication and is able to ambulate in jail jail.  While he was in jail, he repeatedly kicked the plexiglass window as well as slammed his head against the wall.  No LOC.  Patient started complaining of right knee pain and was not able to walk and jail asked that he be evaluated in the emergency department.  Patient states that he was tackled to the ground by police officers during the initial arrest which is when his knee pain started hurting.  Patient reports pain to the anterior aspect of the right knee.  He states he has not been able to walk since then.  He states pain is worse with movement. He denies any LOC, numbness/weakness.       The history is provided by the patient. The history is limited by the condition of the patient.    Past Medical History:  Diagnosis Date  . Anxiety   . Depression   . ETOH abuse   . GERD (gastroesophageal reflux disease)   . Gonorrhea 2016    Patient Active Problem List   Diagnosis Date Noted  . 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 Alcohol dependence with uncomplicated withdrawal ([place]) 03/06/2016  . Alcohol abuse 09/26/2015  . Generalized anxiety disorder 07/04/2013  . 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 Self-mutilation 07/04/2013    History reviewed. No pertinent surgical history.      Home Medications    Prior to Admission medications   Medication Sig Start Date End Date Taking? Authorizing Provider  acetaminophen (TYLENOL) 500 MG tablet Take  1,000 mg by mouth every 6 (six) hours as needed for headache.    [provider]  citalopram (CELEXA) 10 MG tablet Take 1 tablet (10 mg total) by mouth daily. Patient not taking: Reported on 12/22/2018 03/07/16   Niel Hummer, NP  gabapentin (NEURONTIN) 100 MG capsule Take 2 capsules (200 mg total) by mouth 3 (three) times daily. 12/22/18   Patrecia Pour, NP  guaiFENesin (MUCINEX) 600 MG 12 hr tablet Take 600 mg by mouth 2 (two) times daily.    [provider]  HYDROcodone-acetaminophen (NORCO/VICODIN) 5-325 MG tablet Take 1-2 tablets by mouth every 8 (eight) hours as needed. 04/17/19   Volanda Napoleon, PA-C  hydrOXYzine (ATARAX/VISTARIL) 25 MG tablet Take 1 tablet (25 mg total) by mouth 3 (three) times daily as needed for anxiety. 12/22/18   Patrecia Pour, NP  lidocaine (LIDODERM) 5 % Place 1 patch onto the skin daily. Remove & Discard patch within 12 hours or as directed by MD Patient not taking: Reported on 12/22/2018 12/08/18   Nuala Alpha A, PA-C  nicotine (NICODERM CQ - DOSED IN MG/24 HOURS) 21 mg/24hr patch Place 1 patch (21 mg total) onto the skin daily. Patient not taking: Reported on 12/22/2018 03/07/16   Niel Hummer, NP  thiamine 100 MG tablet Take 1 tablet (100 mg total) by mouth daily. Patient not taking: Reported on 12/22/2018 03/07/16   Niel Hummer, NP  traZODone (DESYREL) 50 MG tablet Take 1 tablet (50 mg total) by mouth at bedtime and may repeat dose one time if needed. Patient not taking: Reported on 12/22/2018 03/07/16   Niel Hummer, NP    Family History Family History  Problem Relation Age of Onset  . Hypertension Other   . Diabetes Other     Social History Social History   Tobacco Use  . Smoking status: Current Every Day Smoker    Packs/day: 0.50    Types: Cigarettes  . Smokeless tobacco: Never Used  Substance Use Topics  . Alcohol use: Yes    Comment: 1 pint per day   . Drug use: Yes    Types: Marijuana, Cocaine    Comment: crack      Allergies   Patient has no known allergies.   Review of Systems Review of Systems  Musculoskeletal:       Right knee pain  Skin: Positive for wound.  Neurological: Negative for weakness, numbness and headaches.  All other systems reviewed and are negative.    Physical Exam Updated Vital Signs BP 137/72   Pulse 95   Temp 99.2 F (37.3 C) (Oral)   Resp 18   Ht 5\' 5"  (1.651 m)   Wt 63.5 kg   SpO2 98%   BMI 23.30 kg/m   Physical Exam Vitals signs and nursing note reviewed.  Constitutional:      Appearance: He is well-developed.     Comments: Appears agitated but NAD  HENT:     Head: Normocephalic and atraumatic.      Comments: No skull deformity, crepitus. Eyes:     General: No scleral icterus.       Right eye: No discharge.        Left eye: No discharge.     Conjunctiva/sclera: Conjunctivae normal.     Comments: PERRL. EOMs intact.   Cardiovascular:     Pulses:          Radial pulses are 2+ on the right side and 2+ on the left side.       Dorsalis pedis pulses are 2+ on the right side and 2+ on the left side.  Pulmonary:     Effort: Pulmonary effort is normal.  Musculoskeletal:     Comments: Nurse palpation noted anterior aspect of right knee with overlying abrasion, mild soft tissue swelling.  No deformity or crepitus noted.  Extension intact without difficulty.  Patient is able to flex but does report some pain.  Negative anterior/posterior drawer test. No varus or valgus instability.  No tenderness palpation noted to right hip, right tib-fib, right ankle.  Patient is able to wiggle all 5 toes without any difficulty.  No tenderness palpation noted to left lower extremity.  Skin:    General: Skin is warm and dry.     Capillary Refill: Capillary refill takes less than 2 seconds.     Comments: Good distal cap refill. RLE is not dusky in appearance or cool to touch.  Neurological:     Mental Status: He is alert.     Comments: Sensation intact along major  nerve distributions of BLE.  Cranial nerves III-XII intact Follows commands, Moves all extremities  5/5 strength to BUE and BLE  Sensation intact throughout all major nerve distributions No slurred speech. No facial droop.  Answers questions appropriately   Psychiatric:        Speech: Speech normal.        Behavior: Behavior normal.  ED Treatments / Results  Labs (all labs ordered are listed, but only abnormal results are displayed) Labs Reviewed - No data to display  EKG None  Radiology Ct Knee Right Wo Contrast  Result Date: 04/17/2019 CLINICAL DATA:  Knee pain, injury.  Abnormal knee x-ray EXAM: CT OF THE RIGHT KNEE WITHOUT CONTRAST TECHNIQUE: Multidetector CT imaging of the right knee was performed according to the standard protocol. Multiplanar CT image reconstructions were also generated. COMPARISON:  Plain films 04/16/2019 FINDINGS: There is a nondisplaced, nondepressed medial tibial plateau fracture noted. No additional fracture. No subluxation or dislocation. Moderate joint effusion. IMPRESSION: Nondisplaced medial tibial plateau fracture. Associated moderate joint effusion. Electronically Signed   By: Rolm Baptise M.D.   On: 04/17/2019 00:35   Dg Knee Complete 4 Views Right  Result Date: 04/16/2019 CLINICAL DATA:  Knee pain EXAM: RIGHT KNEE - COMPLETE 4+ VIEW COMPARISON:  09/25/2015 FINDINGS: No dislocation. Moderate knee effusion, with possible fat fluid level. Questionable lucency at the medial tibial plateau on one view. IMPRESSION: Moderate knee effusion with possible fat fluid level and findings questionable for fracture at the medial tibial plateau. Suggest CT for further evaluation. Electronically Signed   By: Donavan Foil M.D.   On: 04/16/2019 23:45    Procedures Procedures (including critical care time)  Medications Ordered in ED Medications  bacitracin ointment (1 application Topical Given 04/16/19 2337)  acetaminophen (TYLENOL) tablet 1,000 mg (1,000 mg  Oral Given 04/16/19 2337)     Initial Impression / Assessment and Plan / ED Course  I have reviewed the triage vital signs and the nursing notes.  Pertinent labs & imaging results that were available during my care of the patient were reviewed by me and considered in my medical decision making (see chart for details).        33 year old male who presents for evaluation of medical clearance.  Presents with [facility] police for evaluation of right knee pain.  Additionally has an abrasion noted to left forehead.  Initial ED arrival, he is afebrile.  He is slightly tachycardic.  I suspect this from his agitation and aggression.  Vitals otherwise stable.  He has difficulty answering questions secondary to his refusal to cooperate.  Abrasion on forehead is very superficial and does not require any stitches or tissue adhesive.  On occasion for imaging of head.  Will plan for imaging of knee.  X-ray of knee shows moderate knee effusion with possible fat fluid level and findings questionable for medial tibial plateau fracture.  Recommend CT for further evaluation.  CT scan shows nondisplaced medial tibial plateau fracture.  Plan for knee immobilizer and crutches.  Re-evaluation after knee immobilizer placement. Patient with good distal sensation and cap refill. Discussed results with patient.  Patient instructed to follow-up with referred orthopedic doctor. At this time, patient exhibits no emergent life-threatening condition that require further evaluation in ED or admission.  Patient will be discharged to jail.  Patient given outpatient orthopedic doctor to follow-up with.  Additionally, given a short course of pain medication for acute fracture. Patient reviewed on PMP with no recent narcotic prescriptions.  Patient had ample opportunity for questions and discussion. All patient's questions were answered with full understanding. Strict return precautions discussed. Patient expresses understanding and  agreement to plan.   Portions of this note were generated with [employer]. Dictation errors may occur despite best attempts at proofreading.   Final Clinical Impressions(s) / ED Diagnoses   Final diagnoses:  Closed fracture of right  tibial plateau, initial encounter    ED Discharge Orders         Ordered    HYDROcodone-acetaminophen (NORCO/VICODIN) 5-325 MG tablet  Every 8 hours PRN     04/17/19 0110           Volanda Napoleon, PA-C 04/17/19 0335    Fatima Blank, MD 04/18/19 [phone]

## 2019-04-17 ENCOUNTER — Emergency Department (HOSPITAL_COMMUNITY): Payer: Self-pay

## 2019-04-17 MED ORDER — HYDROCODONE-ACETAMINOPHEN 5-325 MG PO TABS: 1.0000 | ORAL_TABLET | Freq: Three times a day (TID) | ORAL | 0 refills | Status: DC | PRN

## 2019-04-17 NOTE — Discharge Instructions (Signed)
You can take Tylenol or Ibuprofen as directed for pain. You can alternate Tylenol and Ibuprofen every 4 hours. If you take Tylenol at 1pm, then you can take Ibuprofen at 5pm. Then you can take Tylenol again at 9pm.   Take pain medications as directed for break through pain. Do not drive or operate machinery while taking this medication.   Follow-up with referred orthopedic doctor.  Return to the Emergency Department for any worsening pain, numbness/weakness.

## 2019-04-17 NOTE — ED Notes (Signed)
Officers and pt given and verbalized understanding of d/c instructions and need for follow up with ortho. Told to return if s/s worsen. No further distress or questions upon using crutches to ambulate out of department with [employer].

## 2019-04-17 NOTE — ED Notes (Signed)
Bed: WA09 Expected date:  Expected time:  Means of arrival:  Comments: 

## 2019-04-23 ENCOUNTER — Ambulatory Visit: Payer: Self-pay | Attending: Primary Care | Admitting: Primary Care

## 2019-04-23 ENCOUNTER — Other Ambulatory Visit: Payer: Self-pay

## 2019-04-23 ENCOUNTER — Ambulatory Visit: Payer: Self-pay | Admitting: Physician Assistant

## 2019-04-23 ENCOUNTER — Encounter: Payer: Self-pay | Admitting: Orthopedic Surgery

## 2019-04-23 ENCOUNTER — Ambulatory Visit (INDEPENDENT_AMBULATORY_CARE_PROVIDER_SITE_OTHER): Payer: Self-pay

## 2019-04-23 ENCOUNTER — Encounter: Payer: Self-pay | Admitting: Primary Care

## 2019-04-23 VITALS — Ht 65.0 in | Wt 140.0 lb

## 2019-04-23 DIAGNOSIS — R531 Weakness: Secondary | ICD-10-CM

## 2019-04-23 DIAGNOSIS — S82101B Unspecified fracture of upper end of right tibia, initial encounter for open fracture type I or II: Secondary | ICD-10-CM

## 2019-04-23 DIAGNOSIS — S82141A Displaced bicondylar fracture of right tibia, initial encounter for closed fracture: Secondary | ICD-10-CM

## 2019-04-23 DIAGNOSIS — Z7689 Persons encountering health services in other specified circumstances: Secondary | ICD-10-CM

## 2019-04-23 DIAGNOSIS — F1920 Other psychoactive substance dependence, uncomplicated: Secondary | ICD-10-CM

## 2019-04-23 DIAGNOSIS — M25561 Pain in right knee: Secondary | ICD-10-CM

## 2019-04-23 MED ORDER — HYDROCODONE-ACETAMINOPHEN 5-325 MG PO TABS: 1.0000 | ORAL_TABLET | Freq: Four times a day (QID) | ORAL | 0 refills | Status: DC | PRN

## 2019-04-23 NOTE — Progress Notes (Signed)
Virtual Visit via Telephone Note  I connected with Vincent Peyer on 04/23/19 at 11:10 AM EDT by telephone and verified that I am speaking with the correct person using two identifiers.   I discussed the limitations, risks, security and privacy concerns of performing an evaluation and management service by telephone and the availability of in person appointments. I also discussed with the patient that there may be a patient responsible charge related to this service. The patient expressed understanding and agreed to proceed.   History of Present Illness:    Observations/Objective: Review of Systems  Constitutional: Negative.   HENT: Negative.   Eyes: Negative.   Respiratory: Negative.   Cardiovascular: Negative.   Gastrointestinal: Negative.   Genitourinary: Negative.   Musculoskeletal:       Brace on leg - went back jail took brace off  Skin: Negative.   Neurological: Positive for weakness.  Endo/Heme/Allergies: Negative.   Psychiatric/Behavioral: Positive for substance abuse.    Assessment and Plan:   Follow Up Instructions:    I discussed the assessment and treatment plan with the patient. The patient was provided an opportunity to ask questions and all were answered. The patient agreed with the plan and demonstrated an understanding of the instructions.   The patient was advised to call back or seek an in-person evaluation if the symptoms worsen or if the condition fails to improve as anticipated.  I provided 75 minutes -face-to-face time during this encounter.   Kerin Perna, NP

## 2019-04-23 NOTE — Progress Notes (Signed)
Office Visit Note   Patient: Thomas Hunter           Date of Birth: 11-07-1986           MRN: [id number] Visit Date: 04/23/2019              Requested by: No referring provider defined for this encounter. PCP: Patient, No Pcp Per  Chief Complaint  Patient presents with  . Right Knee - Pain      HPI: The patient is a 33 year old gentleman who is seen for a right knee fracture.  He reports that he was taken to the emergency room as he was intoxicated and was kicking on a plexiglass window while jailed.  He complained of right knee pain and was taken to the emergency room for evaluation.  Plain radiographs and a CT scan were obtained at this time and did show a medial tibial plateau fracture and the patient was placed in a knee immobilizer.  He was supposed to be nonweightbearing with crutches in the immobilizer but they did take the immobilizer off while he was jailed due to metal in the brace.  Since he has been released from jail he is been residing with his mom and walking with crutches with nonweightbearing on the right lower extremity and wearing the immobilizer.  He has been using ice and elevating the leg as much as possible.  He previously reports there has been significant swelling of the knee but this has improved a lot with the elevation and ice.  He is taking some hydrocodone for pain and reports that this is working well.    Assessment & Plan: Visit Diagnoses:  1. Acute pain of right knee   2. Closed fracture of right tibial plateau, initial encounter     Plan: Radiographs were reviewed with Dr. Sharol Given and and it was felt at this time that the patient can be treated conservatively with a Bledsoe hinged knee brace at 0 to 45 degrees with strict nonweightbearing of the right lower extremity.  The patient was given a prescription for [employer] to obtain a hinged knee brace.  He was cautioned against any weightbearing at this time.  He can range the knee to 0 to 45 degrees as per  the hinged knee brace but should be in the brace at all times.  Prescription for hydrocodone was refilled. He will follow-up in 2 weeks with follow-up radiographs at that time to assess for any displacement.  Follow-Up Instructions: Return in about 2 weeks (around 05/07/2019).   Ortho Exam  Patient is alert, oriented, no adenopathy, well-dressed, normal affect, normal respiratory effort. The right knee has mild effusion.  There is mild tenderness over the medial joint line.  Did not perform varus valgus stress at this time, although there does not appear to be gross instability at the knee.  He is neurovascularly intact distally.  He is nontender to palpation over the foot and ankle at this time. Imaging: No results found. No images are attached to the encounter.  Labs: Lab Results  Component Value Date   REPTSTATUS 08/01/2015 FINAL 07/31/2015   CULT NO GROWTH 1 DAY 07/31/2015     Lab Results  Component Value Date   ALBUMIN 4.8 12/22/2018   ALBUMIN 4.8 03/05/2016   ALBUMIN 4.5 09/25/2015    Body mass index is 23.3 kg/m.  Orders:  Orders Placed This Encounter  Procedures  . XR KNEE 3 VIEW RIGHT   Meds ordered this encounter  Medications  . HYDROcodone-acetaminophen (NORCO/VICODIN) 5-325 MG tablet    Sig: Take 1 tablet by mouth every 6 (six) hours as needed for moderate pain.    Dispense:  30 tablet    Refill:  0     Procedures: No procedures performed  Clinical Data: No additional findings.  ROS:  All other systems negative, except as noted in the HPI. Review of Systems  Objective: Vital Signs: Ht 5\' 5"  (1.651 m)   Wt 140 lb (63.5 kg)   BMI 23.30 kg/m   Specialty Comments:  No specialty comments available.  PMFS History: Patient Active Problem List   Diagnosis Date Noted  . Alcohol-induced mood disorder ([place]) 03/06/2016  . Alcohol dependence with uncomplicated withdrawal ([place]) 03/06/2016  . Alcohol abuse 09/26/2015  . Generalized anxiety disorder  07/04/2013  . 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 Self-mutilation 07/04/2013   Past Medical History:  Diagnosis Date  . Anxiety   . Depression   . ETOH abuse   . GERD (gastroesophageal reflux disease)   . Gonorrhea 2016    Family History  Problem Relation Age of Onset  . Hypertension Other   . Diabetes Other     History reviewed. No pertinent surgical history. Social History   Occupational History  . Not on file  Tobacco Use  . Smoking status: Current Every Day Smoker    Packs/day: 1.00    Types: Cigarettes  . Smokeless tobacco: Never Used  Substance and Sexual Activity  . Alcohol use: Not Currently    Comment: 1 pint per day   . Drug use: Yes    Types: Marijuana, Cocaine    Comment: crack  . Sexual activity: Not on file

## 2019-04-23 NOTE — Progress Notes (Signed)
Hospital follow right knee fracture- Unable wear knee brace this was taken off when he went to jail

## 2019-04-24 ENCOUNTER — Encounter: Payer: Self-pay | Admitting: Primary Care

## 2019-04-24 ENCOUNTER — Encounter: Payer: Self-pay | Admitting: Orthopedic Surgery

## 2019-04-24 DIAGNOSIS — S82101B Unspecified fracture of upper end of right tibia, initial encounter for open fracture type I or II: Secondary | ICD-10-CM | POA: Insufficient documentation

## 2019-04-24 DIAGNOSIS — Z7689 Persons encountering health services in other specified circumstances: Secondary | ICD-10-CM | POA: Insufficient documentation

## 2019-04-24 NOTE — Progress Notes (Signed)
New Patient Office Visit  Subjective:  Patient ID: CULLIN DISHMAN, male    DOB: 1986-09-11  Age: 33 y.o. MRN: [id number]  CC: No chief complaint on file.   HPI SEFERINO OSCAR presents to establish care he was initailly a tele visit but was in the parking lot with his mother. Too much information to make a dx and tx . Past history of anxiety, depression, alcohol abuse, and GERD. brought in by [facility] police for evaluation and medical clearance.  Patient  Was in jail he started complaining of right knee pain and was not able to walk and jail asked that he be evaluated in the emergency department.  He states pain is worse with movement. Reviewed notes fx present called ortho Dr. Sharol Given gave instructions to immobilize knee and non weight baring. Dr. Sharol Given was in surgery when I contacted him instructed me to send him to the office and he would see him at the office. Instructions and directions given to mom   Past Medical History:  Diagnosis Date  . Anxiety   . Depression   . ETOH abuse   . GERD (gastroesophageal reflux disease)   . Gonorrhea 2016    History reviewed. No pertinent surgical history.  Family History  Problem Relation Age of Onset  . Hypertension Other   . Diabetes Other     Social History   Socioeconomic History  . Marital status: Single    Spouse name: Not on file  . Number of children: Not on file  . Years of education: Not on file  . Highest education level: Not on file  Occupational History  . Not on file  Social Needs  . Financial resource strain: Not on file  . Food insecurity:    Worry: Not on file    Inability: Not on file  . Transportation needs:    Medical: Not on file    Non-medical: Not on file  Tobacco Use  . Smoking status: Current Every Day Smoker    Packs/day: 1.00    Types: Cigarettes  . Smokeless tobacco: Never Used  Substance and Sexual Activity  . Alcohol use: Not Currently    Comment: 1 pint per day   . Drug use: Yes    Types:  Marijuana, Cocaine    Comment: crack  . Sexual activity: Not on file  Lifestyle  . Physical activity:    Days per week: Not on file    Minutes per session: Not on file  . Stress: Not on file  Relationships  . Social connections:    Talks on phone: Not on file    Gets together: Not on file    Attends religious service: Not on file    Active member of club or organization: Not on file    Attends meetings of clubs or organizations: Not on file    Relationship status: Not on file  . Intimate partner violence:    Fear of current or ex partner: Not on file    Emotionally abused: Not on file    Physically abused: Not on file    Forced sexual activity: Not on file  Other Topics Concern  . Not on file  Social History Narrative   ** Merged History Encounter **        ROS Review of Systems  Constitutional: Positive for appetite change.  HENT: Negative.   Eyes: Negative.   Respiratory: Negative.   Endocrine: Negative.   Genitourinary: Negative.   Musculoskeletal: Positive  for gait problem and joint swelling.  Skin: Positive for pallor.  Allergic/Immunologic: Negative.   Hematological: Negative.   Psychiatric/Behavioral: Positive for agitation. The patient is nervous/anxious.   faint  dorsalis pedis and popliteal   Objective:    Vital Signs: Ht 5\' 5"  (1.651 m)   Wt 140 lb (63.5 kg)   BMI 23.30 kg/m  Physical Exam Constitutional:      Appearance: Normal appearance.  HENT:     Head: Normocephalic and atraumatic.     Right Ear: Tympanic membrane normal.     Left Ear: Tympanic membrane normal.  Eyes:     Extraocular Movements: Extraocular movements intact.  Neck:     Musculoskeletal: Normal range of motion and neck supple.  Cardiovascular:     Rate and Rhythm: Normal rate and regular rhythm.  Pulmonary:     Effort: Pulmonary effort is normal.  Abdominal:     General: Abdomen is flat. Bowel sounds are normal.  Musculoskeletal:        General: Swelling, tenderness and  signs of injury present.  Skin:    General: Skin is dry.  Neurological:     Mental Status: He is alert and oriented to person, place, and time.     Assessment & Plan:   Problem List Items Addressed This Visit    Open fracture of right proximal tibia - Primary   Encounter to establish care      Outpatient Encounter Medications as of 04/23/2019  Medication Sig  . acetaminophen (TYLENOL) 500 MG tablet Take 1,000 mg by mouth every 6 (six) hours as needed for headache.  . gabapentin (NEURONTIN) 100 MG capsule Take 2 capsules (200 mg total) by mouth 3 (three) times daily.  . [DISCONTINUED] HYDROcodone-acetaminophen (NORCO/VICODIN) 5-325 MG tablet Take 1-2 tablets by mouth every 8 (eight) hours as needed.  . citalopram (CELEXA) 10 MG tablet Take 1 tablet (10 mg total) by mouth daily.  Marland Kitchen guaiFENesin (MUCINEX) 600 MG 12 hr tablet Take 600 mg by mouth 2 (two) times daily.  . hydrOXYzine (ATARAX/VISTARIL) 25 MG tablet Take 1 tablet (25 mg total) by mouth 3 (three) times daily as needed for anxiety.  . lidocaine (LIDODERM) 5 % Place 1 patch onto the skin daily. Remove & Discard patch within 12 hours or as directed by MD  . nicotine (NICODERM CQ - DOSED IN MG/24 HOURS) 21 mg/24hr patch Place 1 patch (21 mg total) onto the skin daily.  Marland Kitchen thiamine 100 MG tablet Take 1 tablet (100 mg total) by mouth daily.  . traZODone (DESYREL) 50 MG tablet Take 1 tablet (50 mg total) by mouth at bedtime and may repeat dose one time if needed.   No facility-administered encounter medications on file as of 04/23/2019.   Diagnoses and all orders for this visit:  Type I or II open fracture of proximal end of right tibia, unspecified fracture morphology, initial encounter See HPI   Encounter to establish care/ED follow up and incarceration medial tibial plateau linear fracture without significant displacement  Follow-up: Return if symptoms worsen or fail to improve.  Time spent > 1hr with evaluation consultation  with ortho Dr. Derrel Nip, NP

## 2019-05-07 ENCOUNTER — Ambulatory Visit (INDEPENDENT_AMBULATORY_CARE_PROVIDER_SITE_OTHER): Payer: Self-pay | Admitting: Orthopedic Surgery

## 2019-05-07 ENCOUNTER — Ambulatory Visit (INDEPENDENT_AMBULATORY_CARE_PROVIDER_SITE_OTHER): Payer: Self-pay

## 2019-05-07 ENCOUNTER — Other Ambulatory Visit: Payer: Self-pay

## 2019-05-07 ENCOUNTER — Encounter: Payer: Self-pay | Admitting: Orthopedic Surgery

## 2019-05-07 VITALS — Ht 65.0 in | Wt 140.0 lb

## 2019-05-07 DIAGNOSIS — S82141D Displaced bicondylar fracture of right tibia, subsequent encounter for closed fracture with routine healing: Secondary | ICD-10-CM

## 2019-05-07 NOTE — Progress Notes (Signed)
Office Visit Note   Patient: Thomas Hunter           Date of Birth: 11-16-1986           MRN: [id number] Visit Date: 05/07/2019              Requested by: No referring provider defined for this encounter. PCP: Patient, No Pcp Per  Chief Complaint  Patient presents with  . Right Knee - Follow-up, Pain  . Right Foot - Numbness, Pain, Follow-up      HPI: The patient is a 33 yo gentleman who is seen for post operative follow up following a right medial tibial plateau fracture which occurred 04/17/2019.  He is about 3 weeks out from the injury.  He was unable to afford a hinged knee brace, but has been maintaining strict non weight bearing over the right leg with crutches. He works as a Development worker, international aid and would like to start working as sooner as he can.   Assessment & Plan: Visit Diagnoses:  1. Closed fracture of right tibial plateau with routine healing, subsequent encounter     Plan: Counseled to continue strict non weight bearing right leg with crutches. Wound anticipate return to full activities to tolerance if follow up radiographs in 3 weeks are stable.  Follow up in 3 weeks with radiographs of right knee.    Follow-Up Instructions: Return in about 3 weeks (around 05/28/2019).   Ortho Exam  Patient is alert, oriented, no adenopathy, well-dressed, normal affect, normal respiratory effort. Right knee with decreased edema and ecchymosis nearly resolved.  Good pulses. Has good knee extension and extensor mechanism appears intact.  He has some mild numbness isolated to the fibular head area which he feels has been present over the area since the injury . Tender to palpation over the patella and joint line. Neurovascular intact distally.   Imaging: Xr Knee 3 View Right  Result Date: 05/07/2019 Right knee radiographs with stable medial tibial plateau fracture without signs of displacement.   No images are attached to the encounter.  Labs: Lab Results  Component Value Date    REPTSTATUS 08/01/2015 FINAL 07/31/2015   CULT NO GROWTH 1 DAY 07/31/2015     Lab Results  Component Value Date   ALBUMIN 4.8 12/22/2018   ALBUMIN 4.8 03/05/2016   ALBUMIN 4.5 09/25/2015    Body mass index is 23.3 kg/m.  Orders:  Orders Placed This Encounter  Procedures  . XR KNEE 3 VIEW RIGHT   No orders of the defined types were placed in this encounter.    Procedures: No procedures performed  Clinical Data: No additional findings.  ROS:  All other systems negative, except as noted in the HPI. Review of Systems  Objective: Vital Signs: Ht 5\' 5"  (1.651 m)   Wt 140 lb (63.5 kg)   BMI 23.30 kg/m   Specialty Comments:  No specialty comments available.  PMFS History: Patient Active Problem List   Diagnosis Date Noted  . Open fracture of right proximal tibia 04/24/2019  . Encounter to establish care 04/24/2019  . Alcohol-induced mood disorder ([place]) 03/06/2016  . Alcohol dependence with uncomplicated withdrawal ([place]) 03/06/2016  . Alcohol abuse 09/26/2015  . Generalized anxiety disorder 07/04/2013  . 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 Self-mutilation 07/04/2013   Past Medical History:  Diagnosis Date  . Anxiety   . Depression   . ETOH abuse   . GERD (gastroesophageal reflux disease)   . Gonorrhea 2016    Family  History  Problem Relation Age of Onset  . Hypertension Other   . Diabetes Other     History reviewed. No pertinent surgical history. Social History   Occupational History  . Not on file  Tobacco Use  . Smoking status: Current Every Day Smoker    Packs/day: 1.00    Types: Cigarettes  . Smokeless tobacco: Never Used  Substance and Sexual Activity  . Alcohol use: Not Currently    Comment: 1 pint per day   . Drug use: Yes    Types: Marijuana, Cocaine    Comment: crack  . Sexual activity: Not on file

## 2019-05-08 ENCOUNTER — Telehealth: Payer: Self-pay | Admitting: Orthopedic Surgery

## 2019-05-08 ENCOUNTER — Other Ambulatory Visit: Payer: Self-pay | Admitting: Orthopedic Surgery

## 2019-05-08 DIAGNOSIS — S82141A Displaced bicondylar fracture of right tibia, initial encounter for closed fracture: Secondary | ICD-10-CM

## 2019-05-08 MED ORDER — HYDROCODONE-ACETAMINOPHEN 5-325 MG PO TABS: 1.0000 | ORAL_TABLET | Freq: Four times a day (QID) | ORAL | 0 refills | Status: DC | PRN

## 2019-05-08 NOTE — Telephone Encounter (Signed)
Is requesting pain medication. Was in the office yesterday for a tib-plateau fx last refill was 04/23/19 of Vicodin #30 please advise.

## 2019-05-08 NOTE — Telephone Encounter (Signed)
rx sent

## 2019-05-08 NOTE — Telephone Encounter (Signed)
Pt states he was to get a Rx for Hydrocodone called to [facility] [address]

## 2019-05-11 ENCOUNTER — Telehealth: Payer: Self-pay | Admitting: Cardiology

## 2019-05-11 NOTE — Telephone Encounter (Signed)
Left voice message regarding recent [facility] appt, need to discuss possible recent exposure of Covid testing

## 2019-05-17 ENCOUNTER — Telehealth: Payer: Self-pay | Admitting: *Deleted

## 2019-05-17 NOTE — Telephone Encounter (Signed)
Attempted to call pt on mobile number unable to leave a message.   Called the home number and left a message for him to return my call to [phone] pertaining to his recent ortho appt.

## 2019-05-28 ENCOUNTER — Ambulatory Visit (INDEPENDENT_AMBULATORY_CARE_PROVIDER_SITE_OTHER): Payer: Self-pay | Admitting: Physician Assistant

## 2019-05-28 ENCOUNTER — Ambulatory Visit (INDEPENDENT_AMBULATORY_CARE_PROVIDER_SITE_OTHER): Payer: Self-pay

## 2019-05-28 ENCOUNTER — Telehealth: Payer: Self-pay | Admitting: Orthopedic Surgery

## 2019-05-28 ENCOUNTER — Other Ambulatory Visit: Payer: Self-pay

## 2019-05-28 ENCOUNTER — Encounter: Payer: Self-pay | Admitting: Orthopedic Surgery

## 2019-05-28 DIAGNOSIS — S82141D Displaced bicondylar fracture of right tibia, subsequent encounter for closed fracture with routine healing: Secondary | ICD-10-CM

## 2019-05-28 DIAGNOSIS — S82141A Displaced bicondylar fracture of right tibia, initial encounter for closed fracture: Secondary | ICD-10-CM

## 2019-05-28 MED ORDER — HYDROCODONE-ACETAMINOPHEN 5-325 MG PO TABS: 1.0000 | ORAL_TABLET | Freq: Four times a day (QID) | ORAL | 0 refills | Status: DC | PRN

## 2019-05-28 NOTE — Telephone Encounter (Signed)
Pt called in said he came in to see Shawn today and he said he was prescribed hydrocodone but the pharmacy where it was sent don't have it in stock so he's wondering if he could have it sent to [facility] on [address].  [phone]

## 2019-05-28 NOTE — Progress Notes (Signed)
Office Visit Note   Patient: Thomas Hunter           Date of Birth: 04-Dec-1986           MRN: [id number] Visit Date: 05/28/2019              Requested by: No referring provider defined for this encounter. PCP: Patient, No Pcp Per  Chief Complaint  Patient presents with  . Right Knee - Pain      HPI: The patient is a 33 year old gentleman who is now approximately 6 weeks status post a medial tibial plateau fracture.  He reports some continued pain over the medial joint line.  He has been strict nonweightbearing ambulating with crutches.  He continues to have some pain which is at times sharp over the medial joint line.  He reports some tingling over the fibular head as well.  He feels like the knee does swell off and on.  He did try to start weightbearing in a pool recently and reports that he did have pain with weightbearing even in the pool at times.  Assessment & Plan: Visit Diagnoses:  1. Closed fracture of right tibial plateau with routine healing, subsequent encounter     Plan: Have recommended physical therapy to address the patient's significant quadriceps and VMO weakness and possible ACL laxity versus tear to rehab his knee.  The patient reports that he is unable to afford formal physical therapy at this point.  We did discuss starting the [employer] knee conditioning program specifically with some strengthening exercises to address his quadriceps and VMO weakness.  We discussed that he should not do any of the exercises that cause him any discomfort at this point but he should start to progress with his weightbearing and eventually progressed off of his crutches to tolerance.  He can do activities in the pool to tolerance as well.  He was given written copies of the [employer] knee conditioning program.  His hydrocodone was refilled.  He will follow-up in 4 weeks.  Follow-Up Instructions: Return in about 4 weeks (around 06/25/2019).   Ortho Exam   Patient is alert, oriented, no adenopathy, well-dressed, normal affect, normal respiratory effort. The right knee has a mild residual effusion.  He does have discomfort with maximal knee extension and flexion over the medial joint line.  He has atrophy of the quadriceps and VMO complex.  The extensor mechanism is intact but he fatigues quickly due to quadriceps weakness.  He has a weakly positive anterior drawer.  There is no laxity of the medial or lateral collateral ligaments with varus valgus stress  He has some sensation changes about the fibular head but is neurovascularly intact distally.  He is tender to palpation over the medial joint line.  Imaging: No results found. No images are attached to the encounter.  Labs: Lab Results  Component Value Date   REPTSTATUS 08/01/2015 FINAL 07/31/2015   CULT NO GROWTH 1 DAY 07/31/2015     Lab Results  Component Value Date   ALBUMIN 4.8 12/22/2018   ALBUMIN 4.8 03/05/2016   ALBUMIN 4.5 09/25/2015    There is no height or weight on file to calculate BMI.  Orders:  Orders Placed This Encounter  Procedures  . XR Knee 1-2 Views Right   Meds ordered this encounter  Medications  . DISCONTD: HYDROcodone-acetaminophen (NORCO/VICODIN) 5-325 MG tablet    Sig: Take 1 tablet by mouth every 6 (six) hours as needed for  moderate pain.    Dispense:  30 tablet    Refill:  0     Procedures: No procedures performed  Clinical Data: No additional findings.  ROS:  All other systems negative, except as noted in the HPI. Review of Systems  Objective: Vital Signs: There were no vitals taken for this visit.  Specialty Comments:  No specialty comments available.  PMFS History: Patient Active Problem List   Diagnosis Date Noted  . Open fracture of right proximal tibia 04/24/2019  . Encounter to establish care 04/24/2019  . Alcohol-induced mood disorder ([place]) 03/06/2016  . Alcohol dependence with uncomplicated withdrawal ([place]) 03/06/2016   . Alcohol abuse 09/26/2015  . Generalized anxiety disorder 07/04/2013  . 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 Self-mutilation 07/04/2013   Past Medical History:  Diagnosis Date  . Anxiety   . Depression   . ETOH abuse   . GERD (gastroesophageal reflux disease)   . Gonorrhea 2016    Family History  Problem Relation Age of Onset  . Hypertension Other   . Diabetes Other     History reviewed. No pertinent surgical history. Social History   Occupational History  . Not on file  Tobacco Use  . Smoking status: Current Every Day Smoker    Packs/day: 1.00    Types: Cigarettes  . Smokeless tobacco: Never Used  Substance and Sexual Activity  . Alcohol use: Not Currently    Comment: 1 pint per day   . Drug use: Yes    Types: Marijuana, Cocaine    Comment: crack  . Sexual activity: Not on file

## 2019-05-29 ENCOUNTER — Encounter: Payer: Self-pay | Admitting: Physician Assistant

## 2019-05-29 MED ORDER — HYDROCODONE-ACETAMINOPHEN 5-325 MG PO TABS: 1.0000 | ORAL_TABLET | Freq: Four times a day (QID) | ORAL | 0 refills | Status: DC | PRN

## 2019-05-29 NOTE — Telephone Encounter (Signed)
Sent to the [facility]

## 2019-05-29 NOTE — Telephone Encounter (Signed)
Can you please see below and advise?  

## 2019-06-25 ENCOUNTER — Ambulatory Visit: Payer: Self-pay | Admitting: Family

## 2019-06-27 ENCOUNTER — Telehealth: Payer: Self-pay | Admitting: Family

## 2019-06-27 NOTE — Telephone Encounter (Signed)
Shawn, please advise thanks.

## 2019-06-27 NOTE — Telephone Encounter (Signed)
Patient called needing Rx refilled (Hydrocodone) Patient also said he thought his appointment was today but do not have a ride because his mother is sick with a fever. The number to contact patient is [phone]

## 2019-06-27 NOTE — Telephone Encounter (Signed)
Needs to come in for follow up before can refill medication.

## 2019-06-28 NOTE — Telephone Encounter (Signed)
I tried to call patient on both numbers listed and lvm on [phone] for him to return call and schedule an appt to be seen due to missing his last appt with [place] in the office. Rx was denied at this time. If patient returns calls please schedule. Other number listed is not valid. Thank you.

## 2019-07-03 ENCOUNTER — Encounter: Payer: Self-pay | Admitting: Family

## 2019-07-03 ENCOUNTER — Other Ambulatory Visit: Payer: Self-pay

## 2019-07-03 ENCOUNTER — Ambulatory Visit (INDEPENDENT_AMBULATORY_CARE_PROVIDER_SITE_OTHER): Payer: Self-pay | Admitting: Family

## 2019-07-03 VITALS — Ht 65.0 in | Wt 140.0 lb

## 2019-07-03 DIAGNOSIS — S82141A Displaced bicondylar fracture of right tibia, initial encounter for closed fracture: Secondary | ICD-10-CM

## 2019-07-03 DIAGNOSIS — S82131D Displaced fracture of medial condyle of right tibia, subsequent encounter for closed fracture with routine healing: Secondary | ICD-10-CM | POA: Insufficient documentation

## 2019-07-03 MED ORDER — HYDROCODONE-ACETAMINOPHEN 5-325 MG PO TABS: 1.0000 | ORAL_TABLET | Freq: Three times a day (TID) | ORAL | 0 refills | Status: DC | PRN

## 2019-07-03 NOTE — Progress Notes (Signed)
Office Visit Note   Patient: Thomas Hunter           Date of Birth: Dec 19, 1985           MRN: [id number] Visit Date: 07/03/2019              Requested by: No referring provider defined for this encounter. PCP: Patient, No Pcp Per  Chief Complaint  Patient presents with  . Right Knee - Follow-up    tib-plat fx       HPI: The patient is a 33 year old gentleman seen today nearly 2 months status post a medial tibial plateau fracture.  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  Complains of some pain with full extension and pain with flexion.  Intermittent cramping.  He has been working on water therapy states he has a pool that he has been working on exercises and walking backwards and forwards feels he is trying to strengthen his quads.  Continues to feel his quads are weak.  Complaining of mainly medial pain feels like his kneecap is going to "fall off" swelling has resolved.   Assessment & Plan: Visit Diagnoses:  1. Closed fracture of right tibial plateau, initial encounter     Plan: Have recommended physical therapy to address the patient's significant quadriceps and VMO weakness.  As previously discussed advised he should not do activities that cause him discomfort at this point.  Discussed he may need to back off of his work days and work activities until he is feeling stronger and having less pain with weightbearing.  He was again given a copy of the a [facility] knee conditioning program as well as 1 last refill of his hydrocodone. We will call him in 2 weeks to see if he is doing any better.  Follow-Up Instructions: No follow-ups on file.   Ortho Exam  Patient is alert, oriented, no adenopathy, well-dressed, normal affect, normal respiratory effort. The right knee has a mild residual effusion.  He does have discomfort with maximal knee extension and flexion over the medial joint line.  He  has atrophy of the quadriceps and VMO complex.  The extensor mechanism is intact but he fatigues quickly due to quadriceps weakness.  He has a weakly positive anterior drawer.  There is no laxity of the medial or lateral collateral ligaments with varus valgus stress  He has some sensation changes about the fibular head but is neurovascularly intact distally.  He is tender to palpation over the medial joint line.  Imaging: No results found. No images are attached to the encounter.  Labs: Lab Results  Component Value Date   REPTSTATUS 08/01/2015 FINAL 07/31/2015   CULT NO GROWTH 1 DAY 07/31/2015     Lab Results  Component Value Date   ALBUMIN 4.8 12/22/2018   ALBUMIN 4.8 03/05/2016   ALBUMIN 4.5 09/25/2015    Body mass index is 23.3 kg/m.  Orders:  No orders of the defined types were placed in this encounter.  Meds ordered this encounter  Medications  . HYDROcodone-acetaminophen (NORCO/VICODIN) 5-325 MG tablet    Sig: Take 1 tablet by mouth 3 (three) times daily as needed for moderate pain.    Dispense:  21 tablet    Refill:  0     Procedures: No procedures performed  Clinical Data: No additional findings.  ROS:  All other systems negative, except as noted in  the HPI. Review of Systems  Constitutional: Negative for chills and fever.  Musculoskeletal: Positive for arthralgias, joint swelling and myalgias.    Objective: Vital Signs: Ht 5\' 5"  (1.651 m)   Wt 140 lb (63.5 kg)   BMI 23.30 kg/m   Specialty Comments:  No specialty comments available.  PMFS History: Patient Active Problem List   Diagnosis Date Noted  . Open fracture of right proximal tibia 04/24/2019  . Encounter to establish care 04/24/2019  . Alcohol-induced mood disorder ([place]) 03/06/2016  . Alcohol dependence with uncomplicated withdrawal ([place]) 03/06/2016  . Alcohol abuse 09/26/2015  . Generalized anxiety disorder 07/04/2013  . 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 Self-mutilation  07/04/2013   Past Medical History:  Diagnosis Date  . Anxiety   . Depression   . ETOH abuse   . GERD (gastroesophageal reflux disease)   . Gonorrhea 2016    Family History  Problem Relation Age of Onset  . Hypertension Other   . Diabetes Other     History reviewed. No pertinent surgical history. Social History   Occupational History  . Not on file  Tobacco Use  . Smoking status: Current Every Day Smoker    Packs/day: 1.00    Types: Cigarettes  . Smokeless tobacco: Never Used  Substance and Sexual Activity  . Alcohol use: Not Currently    Comment: 1 pint per day   . Drug use: Yes    Types: Marijuana, Cocaine    Comment: crack  . Sexual activity: Not on file

## 2019-11-27 ENCOUNTER — Other Ambulatory Visit: Payer: Self-pay

## 2019-11-27 ENCOUNTER — Emergency Department (HOSPITAL_COMMUNITY)
Admission: EM | Admit: 2019-11-27 | Discharge: 2019-11-27 | Disposition: A | Discharge status: Home / Self Care | Payer: Self-pay | Attending: Emergency Medicine | Admitting: Emergency Medicine

## 2019-11-27 ENCOUNTER — Encounter (HOSPITAL_COMMUNITY): Payer: Self-pay | Admitting: Emergency Medicine

## 2019-11-27 DIAGNOSIS — Z79899 Other long term (current) drug therapy: Secondary | ICD-10-CM | POA: Insufficient documentation

## 2019-11-27 DIAGNOSIS — F17210 Nicotine dependence, cigarettes, uncomplicated: Secondary | ICD-10-CM | POA: Insufficient documentation

## 2019-11-27 DIAGNOSIS — R21 Rash and other nonspecific skin eruption: Secondary | ICD-10-CM

## 2019-11-27 MED ORDER — VALACYCLOVIR HCL 1 G PO TABS: 1000.0000 mg | ORAL_TABLET | Freq: Three times a day (TID) | ORAL | 0 refills | Status: AC

## 2019-11-27 MED ORDER — IBUPROFEN 800 MG PO TABS: 800.0000 mg | ORAL_TABLET | Freq: Three times a day (TID) | ORAL | 0 refills | Status: DC | PRN

## 2019-11-27 MED ORDER — PENICILLIN V POTASSIUM 500 MG PO TABS: 500.0000 mg | ORAL_TABLET | Freq: Four times a day (QID) | ORAL | 0 refills | Status: DC

## 2019-11-27 MED ORDER — TRAMADOL HCL 50 MG PO TABS: 50.0000 mg | ORAL_TABLET | Freq: Four times a day (QID) | ORAL | 0 refills | Status: DC | PRN

## 2019-11-27 NOTE — ED Provider Notes (Addendum)
[facility] DEPT Provider Note   CSN: [id number] Arrival date & time: 11/27/19  1304     History Chief Complaint  Patient presents with  . genital bump  . Dental Pain  . Leg Pain    Thomas Hunter is a 33 y.o. male.  HPI Patient presents to the emergency department with dental pain along with a rash in the genital region.  The patient states that he noticed that several days ago.  Patient states that his former girlfriend was doing prostitution and he is unclear if this.  The patient states that nothing seems to make his condition better or worse.  Patient is also having dental pain in the left upper second molar.  The patient states that that is been ongoing over the last couple weeks.  Patient states that he has had some pain in his groin as well.    Past Medical History:  Diagnosis Date  . Anxiety   . Depression   . ETOH abuse   . GERD (gastroesophageal reflux disease)   . Gonorrhea 2016    Patient Active Problem List   Diagnosis Date Noted  . Closed fracture of medial portion of right tibial plateau with routine healing 07/03/2019  . Open fracture of right proximal tibia 04/24/2019  . Encounter to establish care 04/24/2019  . Alcohol-induced mood disorder ([place]) 03/06/2016  . Alcohol dependence with uncomplicated withdrawal ([place]) 03/06/2016  . Alcohol abuse 09/26/2015  . Generalized anxiety disorder 07/04/2013  . 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 Self-mutilation 07/04/2013    History reviewed. No pertinent surgical history.     Family History  Problem Relation Age of Onset  . Hypertension Other   . Diabetes Other     Social History   Tobacco Use  . Smoking status: Current Every Day Smoker    Packs/day: 1.00    Types: Cigarettes  . Smokeless tobacco: Never Used  Substance Use Topics  . Alcohol use: Not Currently    Comment: 1 pint per day   . Drug use: Yes    Types: Marijuana, Cocaine    Comment: crack     Home Medications Prior to Admission medications   Medication Sig Start Date End Date Taking? Authorizing Provider  acetaminophen (TYLENOL) 500 MG tablet Take 1,000 mg by mouth every 6 (six) hours as needed for headache.    [provider]  citalopram (CELEXA) 10 MG tablet Take 1 tablet (10 mg total) by mouth daily. 03/07/16   Niel Hummer, NP  gabapentin (NEURONTIN) 100 MG capsule Take 2 capsules (200 mg total) by mouth 3 (three) times daily. 12/22/18   Patrecia Pour, NP  guaiFENesin (MUCINEX) 600 MG 12 hr tablet Take 600 mg by mouth 2 (two) times daily.    [provider]  HYDROcodone-acetaminophen (NORCO/VICODIN) 5-325 MG tablet Take 1 tablet by mouth 3 (three) times daily as needed for moderate pain. 07/03/19   Suzan Slick, NP  hydrOXYzine (ATARAX/VISTARIL) 25 MG tablet Take 1 tablet (25 mg total) by mouth 3 (three) times daily as needed for anxiety. 12/22/18   Patrecia Pour, NP  lidocaine (LIDODERM) 5 % Place 1 patch onto the skin daily. Remove & Discard patch within 12 hours or as directed by MD 12/08/18   Deliah Boston, PA-C  nicotine (NICODERM CQ - DOSED IN MG/24 HOURS) 21 mg/24hr patch Place 1 patch (21 mg total) onto the skin daily. 03/07/16   Niel Hummer, NP  thiamine 100  MG tablet Take 1 tablet (100 mg total) by mouth daily. 03/07/16   Niel Hummer, NP  traZODone (DESYREL) 50 MG tablet Take 1 tablet (50 mg total) by mouth at bedtime and may repeat dose one time if needed. 03/07/16   Niel Hummer, NP    Allergies    Patient has no known allergies.  Review of Systems   Review of Systems All other systems negative except as documented in the HPI. All pertinent positives and negatives as reviewed in the HPI. Physical Exam Updated Vital Signs BP 134/83 (BP Location: Left Arm)   Pulse 97   Temp 98.3 F (36.8 C)   Resp 16   SpO2 100%   Physical Exam Vitals and nursing note reviewed.  Constitutional:      General: He is not in acute  distress.    Appearance: He is well-developed.  HENT:     Head: Normocephalic and atraumatic.     Mouth/Throat:     Dentition: Abnormal dentition. Dental tenderness and dental caries present.   Eyes:     Pupils: Pupils are equal, round, and reactive to light.  Pulmonary:     Effort: Pulmonary effort is normal.  Genitourinary:    Pubic Area: Rash present.    Skin:    General: Skin is warm and dry.  Neurological:     Mental Status: He is alert and oriented to person, place, and time.     ED Results / Procedures / Treatments   Labs (all labs ordered are listed, but only abnormal results are displayed) Labs Reviewed  HSV CULTURE AND TYPING    EKG None  Radiology No results found.  Procedures Procedures (including critical care time)  Medications Ordered in ED Medications - No data to display  ED Course  I have reviewed the triage vital signs and the nursing notes.  Pertinent labs & imaging results that were available during my care of the patient were reviewed by me and considered in my medical decision making (see chart for details).    MDM Rules/Calculators/A&P                     Swabs were obtained of the rash.  Advised the patient this does appear to be herpes infection.  Was offered treatment prophylactically until the results were returned.  The patient has significant pain over that rash site thus leading me to believe that it is a herpes infection.  Patient is advised to return here as needed.  I have given him follow-up with dentistry as well. Final Clinical Impression(s) / ED Diagnoses Final diagnoses:  None    Rx / DC Orders ED Discharge Orders    None       Dalia Heading, PA-C 11/27/19 1458    Dalia Heading, PA-C 11/27/19 1501    Sherwood Gambler, MD 11/28/19 [phone]

## 2019-11-27 NOTE — ED Triage Notes (Signed)
Pt c/o bump above penis since yesterday. Left leg pains for couple days and left upper tooth pain for a couple weeks.

## 2019-11-27 NOTE — Discharge Instructions (Signed)
Return here as needed. Follow up with the dentist provided. °

## 2019-11-29 LAB — HSV CULTURE AND TYPING

## 2020-04-02 ENCOUNTER — Emergency Department (HOSPITAL_COMMUNITY): Payer: Medicaid Other

## 2020-04-02 ENCOUNTER — Emergency Department (HOSPITAL_COMMUNITY)
Admission: EM | Admit: 2020-04-02 | Discharge: 2020-04-02 | Discharge status: Against Medical Advice | Payer: Medicaid Other | Attending: Emergency Medicine | Admitting: Emergency Medicine

## 2020-04-02 ENCOUNTER — Encounter (HOSPITAL_COMMUNITY): Payer: Self-pay

## 2020-04-02 ENCOUNTER — Other Ambulatory Visit: Payer: Self-pay

## 2020-04-02 DIAGNOSIS — Y939 Activity, unspecified: Secondary | ICD-10-CM | POA: Insufficient documentation

## 2020-04-02 DIAGNOSIS — Y929 Unspecified place or not applicable: Secondary | ICD-10-CM | POA: Insufficient documentation

## 2020-04-02 DIAGNOSIS — R4589 Other symptoms and signs involving emotional state: Secondary | ICD-10-CM

## 2020-04-02 DIAGNOSIS — W269XXA Contact with unspecified sharp object(s), initial encounter: Secondary | ICD-10-CM | POA: Insufficient documentation

## 2020-04-02 DIAGNOSIS — R45851 Suicidal ideations: Secondary | ICD-10-CM | POA: Insufficient documentation

## 2020-04-02 DIAGNOSIS — Y999 Unspecified external cause status: Secondary | ICD-10-CM | POA: Insufficient documentation

## 2020-04-02 DIAGNOSIS — R4588 Nonsuicidal self-harm: Secondary | ICD-10-CM

## 2020-04-02 DIAGNOSIS — Z20822 Contact with and (suspected) exposure to covid-19: Secondary | ICD-10-CM | POA: Insufficient documentation

## 2020-04-02 DIAGNOSIS — S61512A Laceration without foreign body of left wrist, initial encounter: Secondary | ICD-10-CM | POA: Insufficient documentation

## 2020-04-02 DIAGNOSIS — F17210 Nicotine dependence, cigarettes, uncomplicated: Secondary | ICD-10-CM | POA: Insufficient documentation

## 2020-04-02 LAB — COMPREHENSIVE METABOLIC PANEL
ALT: 23 U/L (ref 0–44)
AST: 23 U/L (ref 15–41)
Albumin: 4.8 g/dL (ref 3.5–5.0)
Alkaline Phosphatase: 38 U/L (ref 38–126)
Anion gap: 11 (ref 5–15)
BUN: 8 mg/dL (ref 6–20)
CO2: 23 mmol/L (ref 22–32)
Calcium: 8.9 mg/dL (ref 8.9–10.3)
Chloride: 109 mmol/L (ref 98–111)
Creatinine, Ser: 0.58 mg/dL — ABNORMAL LOW (ref 0.61–1.24)
GFR calc Af Amer: >60 mL/min (ref >60)
GFR calc non Af Amer: >60 mL/min (ref >60)
Glucose, Bld: 109 mg/dL — ABNORMAL HIGH (ref 70–99)
Potassium: 3.8 mmol/L (ref 3.5–5.1)
Sodium: 143 mmol/L (ref 135–145)
Total Bilirubin: 0.5 mg/dL (ref 0.3–1.2)
Total Protein: 7.5 g/dL (ref 6.5–8.1)

## 2020-04-02 LAB — RESPIRATORY PANEL BY RT PCR (FLU A&B, COVID)
Influenza A by PCR: NEGATIVE
Influenza B by PCR: NEGATIVE
SARS Coronavirus 2 by RT PCR: NEGATIVE

## 2020-04-02 LAB — CBC WITH DIFFERENTIAL/PLATELET
Abs Immature Granulocytes: 0.06 10*3/uL (ref 0.00–0.07)
Basophils Absolute: 0 10*3/uL (ref 0.0–0.1)
Basophils Relative: 0 %
Eosinophils Absolute: 0 10*3/uL (ref 0.0–0.5)
Eosinophils Relative: 0 %
HCT: 45 % (ref 39.0–52.0)
Hemoglobin: 15.3 g/dL (ref 13.0–17.0)
Immature Granulocytes: 1 %
Lymphocytes Relative: 22 %
Lymphs Abs: 2.8 10*3/uL (ref 0.7–4.0)
MCH: 33.1 pg (ref 26.0–34.0)
MCHC: 34 g/dL (ref 30.0–36.0)
MCV: 97.4 fL (ref 80.0–100.0)
Monocytes Absolute: 0.7 10*3/uL (ref 0.1–1.0)
Monocytes Relative: 6 %
Neutro Abs: 8.9 10*3/uL — ABNORMAL HIGH (ref 1.7–7.7)
Neutrophils Relative %: 71 %
Platelets: 296 10*3/uL (ref 150–400)
RBC: 4.62 MIL/uL (ref 4.22–5.81)
RDW: 12.9 % (ref 11.5–15.5)
WBC: 12.5 10*3/uL — ABNORMAL HIGH (ref 4.0–10.5)
nRBC: 0 % (ref 0.0–0.2)

## 2020-04-02 LAB — RAPID URINE DRUG SCREEN, HOSP PERFORMED
Amphetamines: NOT DETECTED
Barbiturates: NOT DETECTED
Benzodiazepines: NOT DETECTED
Cocaine: NOT DETECTED
Opiates: NOT DETECTED
Tetrahydrocannabinol: POSITIVE — AB

## 2020-04-02 LAB — ETHANOL: Alcohol, Ethyl (B): 178 mg/dL — ABNORMAL HIGH (ref <10)

## 2020-04-02 MED ORDER — LIDOCAINE HCL (PF) 1 % IJ SOLN
5.0000 mL | Freq: Once | INTRAMUSCULAR | Status: AC
  Administered 2020-04-02: 5 mL
  Filled 2020-04-02: qty 30

## 2020-04-02 NOTE — ED Triage Notes (Signed)
Patient presents to ED with SI & self inflicted laceration to left wrist, has blood on clothes and hands but bleeding controlled at this time, also admits to drinking tonight, states he doesn't like his life anymore, has court tomorrow for some bogus charges per patient, arrived to ED ambulatory and talking by EMS

## 2020-04-02 NOTE — ED Notes (Signed)
Patient came out of room and stated that he had to go to court, [name],MD aware patient was leaving. Patient departed ambulatory and stable at this time.

## 2020-04-02 NOTE — ED Provider Notes (Signed)
[facility] Emergency Department Provider Note MRN:  [id number]  [place] date & time: 04/02/20     Chief Complaint   Suicidal and Extremity Laceration   History of Present Illness   Thomas Hunter is a 34 y.o. year-old male with a history of alcohol abuse, bipolar disorder presenting to the ED with chief complaint of SI.  Patient explains that he had a really bad day, was in court all day for legal issues.  He cut his wrist in an attempt to kill himself.  Here for help.  Denies any other injuries, alcohol today but no other drugs.  Pain is mild, constant, worse with palpation.  Review of Systems  A complete 10 system review of systems was obtained and all systems are negative except as noted in the HPI and PMH.   Patient's Health History    Past Medical History:  Diagnosis Date  . Anxiety   . Depression   . ETOH abuse   . GERD (gastroesophageal reflux disease)   . Gonorrhea 2016    History reviewed. No pertinent surgical history.  Family History  Problem Relation Age of Onset  . Hypertension Other   . Diabetes Other     Social History   Socioeconomic History  . Marital status: Single    Spouse name: Not on file  . Number of children: Not on file  . Years of education: Not on file  . Highest education level: Not on file  Occupational History  . Not on file  Tobacco Use  . Smoking status: Current Every Day Smoker    Packs/day: 1.00    Types: Cigarettes  . Smokeless tobacco: Never Used  Substance and Sexual Activity  . Alcohol use: Not Currently    Comment: 1 pint per day   . Drug use: Yes    Types: Marijuana, Cocaine    Comment: crack  . Sexual activity: Not on file  Other Topics Concern  . Not on file  Social History Narrative   ** Merged History Encounter **       Social Determinants of Health   Financial Resource Strain:   . Difficulty of Paying Living Expenses:   Food Insecurity:   . Worried About Charity fundraiser in  the Last Year:   . Arboriculturist in the Last Year:   Transportation Needs:   . Film/video editor (Medical):   Marland Kitchen Lack of Transportation (Non-Medical):   Physical Activity:   . Days of Exercise per Week:   . Minutes of Exercise per Session:   Stress:   . Feeling of Stress :   Social Connections:   . Frequency of Communication with Friends and Family:   . Frequency of Social Gatherings with Friends and Family:   . Attends Religious Services:   . Active Member of Clubs or Organizations:   . Attends Archivist Meetings:   Marland Kitchen Marital Status:   Intimate Partner Violence:   . Fear of Current or Ex-Partner:   . Emotionally Abused:   Marland Kitchen Physically Abused:   . Sexually Abused:      Physical Exam   Vitals:   04/02/20 0202 04/02/20 0300  BP: 121/82 114/73  Pulse: (!) 101 97  Resp: 17 15  Temp: 98 F (36.7 C)   SpO2: 95% 99%    CONSTITUTIONAL: Well-appearing, NAD NEURO:  Alert and oriented x 3, no focal deficits EYES:  eyes equal and reactive ENT/NECK:  no LAD, no  JVD CARDIO: Regular rate, well-perfused, normal S1 and S2 PULM:  CTAB no wheezing or rhonchi GI/GU:  normal bowel sounds, non-distended, non-tender MSK/SPINE:  No gross deformities, no edema SKIN: 2.5 cm laceration to the anterior left forearm PSYCH:  Appropriate speech and behavior  *Additional and/or pertinent findings included in MDM below  Diagnostic and Interventional Summary    EKG Interpretation  Date/Time:    Ventricular Rate:    PR Interval:    QRS Duration:   QT Interval:    QTC Calculation:   R Axis:     Text Interpretation:        Labs Reviewed  COMPREHENSIVE METABOLIC PANEL - Abnormal; Notable for the following components:      Result Value   Glucose, Bld 109 (*)    Creatinine, Ser 0.58 (*)    All other components within normal limits  ETHANOL - Abnormal; Notable for the following components:   Alcohol, Ethyl (B) 178 (*)    All other components within normal limits  RAPID  URINE DRUG SCREEN, HOSP PERFORMED - Abnormal; Notable for the following components:   Tetrahydrocannabinol POSITIVE (*)    All other components within normal limits  CBC WITH DIFFERENTIAL/PLATELET - Abnormal; Notable for the following components:   WBC 12.5 (*)    Neutro Abs 8.9 (*)    All other components within normal limits  RESPIRATORY PANEL BY RT PCR (FLU A&B, COVID)    DG Forearm Left  Final Result      Medications  lidocaine (PF) (XYLOCAINE) 1 % injection 5 mL (has no administration in time range)     Procedures  /  Critical Care .Marland KitchenLaceration Repair  Date/Time: 04/02/2020 3:54 AM Performed by: Maudie Flakes, MD Authorized by: Maudie Flakes, MD   Consent:    Consent obtained:  Verbal   Consent given by:  Patient   Risks discussed:  Infection, need for additional repair, nerve damage, poor wound healing, poor cosmetic result, pain, retained foreign body, tendon damage and vascular damage   Alternatives discussed:  No treatment Anesthesia (see MAR for exact dosages):    Anesthesia method:  Local infiltration   Local anesthetic:  Lidocaine 1% w/o epi Laceration details:    Location:  Shoulder/arm   Shoulder/arm location:  L lower arm   Length (cm):  3   Depth (mm):  2 Repair type:    Repair type:  Simple Pre-procedure details:    Preparation:  Patient was prepped and draped in usual sterile fashion Exploration:    Hemostasis achieved with:  Direct pressure   Wound exploration: wound explored through full range of motion and entire depth of wound probed and visualized     Contaminated: no   Treatment:    Area cleansed with:  Saline   Amount of cleaning:  Standard Skin repair:    Repair method:  Sutures   Suture size:  4-0   Suture material:  Prolene   Suture technique:  Simple interrupted   Number of sutures:  6 Approximation:    Approximation:  Close Post-procedure details:    Dressing:  Open (no dressing)   Patient tolerance of procedure:  Tolerated  well, no immediate complications    ED Course and Medical Decision Making  I have reviewed the triage vital signs, the nursing notes, and pertinent available records from the EMR.  Listed above are laboratory and imaging tests that I personally ordered, reviewed, and interpreted and then considered in my medical decision making (see below  for details).      Laceration to the wrist, will perform suture repair, consult TTS.  Upon closer review of the prior documentation and discussions with the patient, it appears that he has a long history of cutting, with no significant injuries occurring in the past.  Multiple superficial scars to the bilateral arms.  He explains that he was not trying to hit an artery or seriously injure himself, was simply having a bad day and was cutting himself for a "release".  He denies SI, HI, AVH.  He is lucid and reasonable in our discussions.  He states that he does not want to miss his court date tomorrow morning.  Patient was contracted for safety, agrees to return the emergency department before harming self or others.  Still I advised that he stay for TTS evaluation but I cannot hold him against his will given these findings.  Signed out to default provider at shift change.  Barth Kirks. Sedonia Small, MD [place] [email or web address]  Final Clinical Impressions(s) / ED Diagnoses     ICD-10-CM   1. Non-suicidal self harm  R45.89     ED Discharge Orders    None       Discharge Instructions Discussed with and Provided to Patient:   Discharge Instructions   None       Maudie Flakes, MD 04/02/20 [phone]

## 2020-04-07 ENCOUNTER — Ambulatory Visit: Payer: Self-pay | Admitting: *Deleted

## 2020-04-07 NOTE — Telephone Encounter (Signed)
Summary: stitches   Patient is requesting to speak to nurse regarding patient has stitches in his arm, Patient states when he got out of shower when he was patting stitches dry, white pus came out.   Call back [phone]      Advised patient to have wound checked at UC/ED to prevent further symptoms. Patient has to work- but is going to go afterward.  Reason for Disposition . [1] Pus or cloudy fluid draining from wound AND [2] no fever  Answer Assessment - Initial Assessment Questions 1. LOCATION: "Where are the sutures (or staples) located?"      L wrist 2. NUMBER: "How many sutures (or staples) are there?"      6 3. DATE IN: "When were the sutures (or staples) put in?"       7-8 days 4. DATE OUT: "When did your doctor tell you the sutures (or staples) needed to come out?"     Patient left before instructed 5. OTHER SYMPTOMS: "Do you have any other symptoms?" (e.g., wound pain, discharge, fever?)     Fatigue, slight redness and soreness at one end of wound 6. PREGNANCY: "Is there any chance you are pregnant?" "When was your last menstrual period?"     n/a  Protocols used: WOUND INFECTION-A-AH, SUTURE OR STAPLE QUESTIONS-A-AH

## 2020-04-23 ENCOUNTER — Encounter (HOSPITAL_COMMUNITY): Payer: Self-pay

## 2020-04-23 ENCOUNTER — Ambulatory Visit (HOSPITAL_COMMUNITY)
Admission: EM | Admit: 2020-04-23 | Discharge: 2020-04-23 | Disposition: A | Discharge status: Home / Self Care | Payer: Self-pay | Attending: Physician Assistant | Admitting: Physician Assistant

## 2020-04-23 ENCOUNTER — Other Ambulatory Visit: Payer: Self-pay

## 2020-04-23 DIAGNOSIS — R1031 Right lower quadrant pain: Secondary | ICD-10-CM

## 2020-04-23 DIAGNOSIS — Z4802 Encounter for removal of sutures: Secondary | ICD-10-CM

## 2020-04-23 MED ORDER — IBUPROFEN 800 MG PO TABS: 800.0000 mg | ORAL_TABLET | Freq: Three times a day (TID) | ORAL | 0 refills | Status: DC | PRN

## 2020-04-23 NOTE — ED Triage Notes (Signed)
Patient reports RLQ abdominal pain that wraps around to his right lower back. 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

## 2020-04-23 NOTE — Discharge Instructions (Signed)
I believe this is a muscle strain Take ibuprofen as needed  Establish with a primary care for further follow up  If pain becomes severe, you have fever, chills or vomiting, please go to the Emergency Department

## 2020-04-23 NOTE — ED Provider Notes (Addendum)
[place]    CSN: [id number] Arrival date & time: 04/23/20  1623      History   Chief Complaint Chief Complaint  Patient presents with  . Abdominal Pain    HPI RYAN BROCKSCHMIDT is a 34 y.o. male.   Patient reports for evaluation of right-sided lower abdominal discomfort as well as suture removal.  Reports that abdominal discomfort has been ongoing issue for 1 to 2 months however it was more noticeable today.  He reports a month or 2 ago he was doing a heavy lifting job and noticed some discomfort in his right lower abdomen and side.  Ports the pain was not extreme and improved over few days to a week.  Reports since that time he is occasionally felt like there is a bulging in his right lower abdominal area such that" the muscle is moving".  He denies significant discomfort when this occurs.  He reports he came today however because he had some right side pain.  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  Denies pain at rest at current.  He reports he is not experiencing the bulging in clinic currently.  Denies any testicular pain or swelling.  Denies painful urination, frequency urination or urgency.  Denies any blood in his urine.  Denies ever feeling a pop sensation.  Reports he has regular soft bowel movements.  There have been no urinary symptoms.  No fevers or chills.  No history of hernia.  He does report a history of having his right knee broken and has had some atrophy in the right leg and believes this may also be impacting his right side of his lower abdomen.   He would also like to have 2 sutures removed from his left forearm.  He reports these have been in for a few weeks and he has not had a chance to come and get them removed.     Past Medical History:  Diagnosis Date  . Anxiety   . Depression   . ETOH abuse   . GERD (gastroesophageal reflux disease)   . Gonorrhea 2016    Patient Active Problem List   Diagnosis Date  Noted  . Closed fracture of medial portion of right tibial plateau with routine healing 07/03/2019  . Open fracture of right proximal tibia 04/24/2019  . Encounter to establish care 04/24/2019  . 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 Alcohol dependence with uncomplicated withdrawal ([facility]) 03/06/2016  . Alcohol abuse 09/26/2015  . Generalized anxiety disorder 07/04/2013  . 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 Self-mutilation 07/04/2013    History reviewed. No pertinent surgical history.     Home Medications    Prior to Admission medications   Medication Sig Start Date End Date Taking? Authorizing Provider  Calcium-Magnesium-Zinc 220 010 7312 MG TABS Take 1 tablet by mouth every other day.    [provider]  citalopram (CELEXA) 10 MG tablet Take 1 tablet (10 mg total) by mouth daily. Patient not taking: Reported on 04/02/2020 03/07/16   Niel Hummer, NP  gabapentin (NEURONTIN) 100 MG capsule Take 2 capsules (200 mg total) by mouth 3 (three) times daily. Patient not taking: Reported on 04/02/2020 12/22/18   Patrecia Pour, NP  HYDROcodone-acetaminophen (NORCO/VICODIN) 5-325 MG tablet Take 1 tablet by mouth 3 (three) times daily as needed for moderate pain. Patient not taking: Reported on 04/02/2020 07/03/19   Suzan Slick, NP  hydrOXYzine (ATARAX/VISTARIL) 25 MG tablet Take 1  tablet (25 mg total) by mouth 3 (three) times daily as needed for anxiety. Patient not taking: Reported on 04/02/2020 12/22/18   Patrecia Pour, NP  ibuprofen (ADVIL) 800 MG tablet Take 1 tablet (800 mg total) by mouth every 8 (eight) hours as needed. 04/23/20   Eleno Weimar, Marguerita Beards, PA-C  lidocaine (LIDODERM) 5 % Place 1 patch onto the skin daily. Remove & Discard patch within 12 hours or as directed by MD Patient not taking: Reported on 04/02/2020 12/08/18   Nuala Alpha A, PA-C  Multiple Vitamin (MULTIVITAMIN WITH MINERALS) TABS tablet Take 1 tablet by mouth daily.    [provider]    nicotine (NICODERM CQ - DOSED IN MG/24 HOURS) 21 mg/24hr patch Place 1 patch (21 mg total) onto the skin daily. Patient not taking: Reported on 04/02/2020 03/07/16   Niel Hummer, NP  penicillin v potassium (VEETID) 500 MG tablet Take 1 tablet (500 mg total) by mouth 4 (four) times daily. Patient not taking: Reported on 04/02/2020 11/27/19   Dalia Heading, PA-C  thiamine 100 MG tablet Take 1 tablet (100 mg total) by mouth daily. Patient not taking: Reported on 04/02/2020 03/07/16   Niel Hummer, NP  traMADol (ULTRAM) 50 MG tablet Take 1 tablet (50 mg total) by mouth every 6 (six) hours as needed for severe pain. Patient not taking: Reported on 04/02/2020 11/27/19   Dalia Heading, PA-C  traZODone (DESYREL) 50 MG tablet Take 1 tablet (50 mg total) by mouth at bedtime and may repeat dose one time if needed. Patient not taking: Reported on 04/02/2020 03/07/16   Niel Hummer, NP    Family History Family History  Problem Relation Age of Onset  . Hypertension Other   . Diabetes Other     Social History Social History   Tobacco Use  . Smoking status: Current Every Day Smoker    Packs/day: 0.50    Types: Cigarettes  . Smokeless tobacco: Never Used  Substance Use Topics  . Alcohol use: Not Currently    Comment: 1 pint per day   . Drug use: Yes    Types: Marijuana, Cocaine    Comment: crack     Allergies   Patient has no known allergies.   Review of Systems Review of Systems  Per HPI Physical Exam Triage Vital Signs ED Triage Vitals  Enc Vitals Group     BP 04/23/20 1638 123/80     Pulse Rate 04/23/20 1638 97     Resp 04/23/20 1638 16     Temp 04/23/20 1638 97.9 F (36.6 C)     Temp Source 04/23/20 1638 Oral     SpO2 04/23/20 1638 99 %     Weight --      Height --      Head Circumference --      Peak Flow --      Pain Score 04/23/20 1637 8     Pain Loc --      Pain Edu? --      Excl. in [place]? --    No data found.  Updated Vital Signs BP 123/80 (BP  Location: Left Arm)   Pulse 97   Temp 97.9 F (36.6 C) (Oral)   Resp 16   SpO2 99%   Visual Acuity Right Eye Distance:   Left Eye Distance:   Bilateral Distance:    Right Eye Near:   Left Eye Near:    Bilateral Near:     Physical Exam Vitals  and nursing note reviewed.  Constitutional:      General: He is not in acute distress.    Appearance: He is well-developed. He is not ill-appearing.  HENT:     Head: Normocephalic and atraumatic.  Eyes:     Conjunctiva/sclera: Conjunctivae normal.  Cardiovascular:     Rate and Rhythm: Normal rate and regular rhythm.     Heart sounds: No murmur.  Pulmonary:     Effort: Pulmonary effort is normal. No respiratory distress.     Breath sounds: Normal breath sounds.  Abdominal:     General: Abdomen is flat. Bowel sounds are normal. There is no distension.     Palpations: Abdomen is soft. There is no hepatomegaly, splenomegaly or pulsatile mass.     Tenderness: There is abdominal tenderness (Very mild right lower quadrant tenderness). There is no right CVA tenderness, left CVA tenderness or guarding. Negative signs include McBurney's sign.     Hernia: No hernia is present.  Genitourinary:    Penis: Normal.      Testes: Normal. Cremasteric reflex is present.        Right: Mass, tenderness or swelling not present.        Left: Mass, tenderness or swelling not present.  Musculoskeletal:     Cervical back: Neck supple.  Skin:    General: Skin is warm and dry.     Comments: Well-healed laceration with 2 sutures meeting left forearm  Neurological:     General: No focal deficit present.     Mental Status: He is alert.      UC Treatments / Results  Labs (all labs ordered are listed, but only abnormal results are displayed) Labs Reviewed - No data to display  EKG   Radiology No results found.  Procedures Procedures (including critical care time)  Medications Ordered in UC Medications - No data to display  Initial Impression /  Assessment and Plan / UC Course  I have reviewed the triage vital signs and the nursing notes.  Pertinent labs & imaging results that were available during my care of the patient were reviewed by me and considered in my medical decision making (see chart for details).     #Right lower quadrant pain #Suture removal Patient 34 year old presenting with right lower quadrant pain.  This most likely musculoskeletal.  There is no sign of hernia and doubt appendicitis given how mild symptoms are without other accompanying problems.  Doubt urinary tract etiology.  Discussed treating this as a muscle strain with ibuprofen as needed and to follow-up with primary care for further evaluation.  Sutures easily removed from left forearm.  Strict emergency department return precautions were discussed.  He verbalized understanding. Final Clinical Impressions(s) / UC Diagnoses   Final diagnoses:  Right lower quadrant abdominal pain  Visit for suture removal     Discharge Instructions     I believe this is a muscle strain Take ibuprofen as needed  Establish with a primary care for further follow up  If pain becomes severe, you have fever, chills or vomiting, please go to the Emergency Department      ED Prescriptions    Medication Sig Dispense Auth. Provider   ibuprofen (ADVIL) 800 MG tablet Take 1 tablet (800 mg total) by mouth every 8 (eight) hours as needed. 21 tablet Murdock Jellison, Marguerita Beards, PA-C     PDMP not reviewed this encounter.   Purnell Shoemaker, PA-C 04/23/20 2105    Antonetta Clanton, Marguerita Beards, PA-C 04/23/20 2106

## 2020-05-25 ENCOUNTER — Encounter (HOSPITAL_COMMUNITY): Payer: Self-pay | Admitting: Licensed Clinical Social Worker

## 2020-05-25 ENCOUNTER — Other Ambulatory Visit: Payer: Self-pay

## 2020-05-25 ENCOUNTER — Ambulatory Visit (INDEPENDENT_AMBULATORY_CARE_PROVIDER_SITE_OTHER): Payer: No Payment, Other | Admitting: Licensed Clinical Social Worker

## 2020-05-25 DIAGNOSIS — F10230 Alcohol dependence with withdrawal, uncomplicated: Secondary | ICD-10-CM

## 2020-05-25 DIAGNOSIS — F3181 Bipolar II disorder: Secondary | ICD-10-CM

## 2020-05-25 DIAGNOSIS — F1010 Alcohol abuse, uncomplicated: Secondary | ICD-10-CM

## 2020-05-25 DIAGNOSIS — F314 Bipolar disorder, current episode depressed, severe, without psychotic features: Secondary | ICD-10-CM

## 2020-05-25 DIAGNOSIS — F330 Major depressive disorder, recurrent, mild: Secondary | ICD-10-CM

## 2020-05-25 DIAGNOSIS — F1094 Alcohol use, unspecified with alcohol-induced mood disorder: Secondary | ICD-10-CM | POA: Diagnosis not present

## 2020-05-25 DIAGNOSIS — F411 Generalized anxiety disorder: Secondary | ICD-10-CM

## 2020-05-25 NOTE — Patient Instructions (Signed)
Alcohol Abuse and Dependence Information, Adult Alcohol is a widely available drug. People drink alcohol in different amounts. People who drink alcohol very often and in large amounts often have problems during and after drinking. They may develop what is called an alcohol use disorder. There are two main types of alcohol use disorders:  Alcohol abuse. This is when you use alcohol too much or too often. You may use alcohol to make yourself feel happy or to reduce stress. You may have a hard time setting a limit on the amount you drink.  Alcohol dependence. This is when you use alcohol consistently for a period of time, and your body changes as a result. This can make it hard to stop drinking because you may start to feel sick or feel different when you do not use alcohol. These symptoms are known as withdrawal. How can alcohol abuse and dependence affect me? Alcohol abuse and dependence can have a negative effect on your life. Drinking too much can lead to addiction. You may feel like you need alcohol to function normally. You may drink alcohol before work in the morning, during the day, or as soon as you get home from work in the evening. These actions can result in:  Poor work performance.  Job loss.  Financial problems.  Car crashes or criminal charges from driving after drinking alcohol.  Problems in your relationships with friends and family.  Losing the trust and respect of coworkers, friends, and family. Drinking heavily over a long period of time can permanently damage your body and brain, and can cause lifelong health issues, such as:  Damage to your liver or pancreas.  Heart problems, high blood pressure, or stroke.  Certain cancers.  Decreased ability to fight infections.  Brain or nerve damage.  Depression.  Early (premature) death. If you are careless or you crave alcohol, it is easy to drink more than your body can handle (overdose). Alcohol overdose is a serious  situation that requires hospitalization. It may lead to permanent injuries or death. What can increase my risk?  Having a family history of alcohol abuse.  Having depression or other mental health conditions.  Beginning to drink at an early age.  Binge drinking often.  Experiencing trauma, stress, and an unstable home life during childhood.  Spending time with people who drink often. What actions can I take to prevent or manage alcohol abuse and dependence?  Do not drink alcohol if: ? Your health care provider tells you not to drink. ? You are pregnant, may be pregnant, or are planning to become pregnant.  If you drink alcohol: ? Limit how much you use to:  0-1 drink a day for women.  0-2 drinks a day for men. ? Be aware of how much alcohol is in your drink. In the [place] one drink equals one 12 oz bottle of beer (355 mL), one 5 oz glass of wine (148 mL), or one 1 oz glass of hard liquor (44 mL).  Stop drinking if you have been drinking too much. This can be very hard to do if you are used to abusing alcohol. If you begin to have withdrawal symptoms, talk with your health care provider or a person that you trust. These symptoms may include anxiety, shaky hands, headache, nausea, sweating, or not being able to sleep.  Choose to drink nonalcoholic beverages in social gatherings and places where there may be alcohol. Activity  Spend more time on activities that you enjoy that do   not involve alcohol, like hobbies or exercise.  Find healthy ways to cope with stress, such as exercise, meditation, or spending time with people you care about. General information  Talk to your family, coworkers, and friends about supporting you in your efforts to stop drinking. If they drink, ask them not to drink around you. Spend more time with people who do not drink alcohol.  If you think that you have an alcohol dependency problem: ? Tell friends or family about your concerns. ? Talk with your  health care provider or another health professional about where to get help. ? Work with a therapist and a chemical dependency counselor. ? Consider joining a support group for people who struggle with alcohol abuse and dependence. Where to find support   Your health care provider.  SMART Recovery: www.smartrecovery.org Therapy and support groups  Local treatment centers or chemical dependency counselors.  Local AA groups in your community: www.aa.org Where to find more information  Centers for Disease Control and Prevention: www.cdc.gov  National Institute on Alcohol Abuse and Alcoholism: www.niaaa.nih.gov  Alcoholics Anonymous (AA): www.aa.org Contact a health care provider if:  You drank more or for longer than you intended on more than one occasion.  You tried to stop drinking or to cut back on how much you drink, but you were not able to.  You often drink to the point of vomiting or passing out.  You want to drink so badly that you cannot think about anything else.  You have problems in your life due to drinking, but you continue to drink.  You keep drinking even though you feel anxious, depressed, or have experienced memory loss.  You have stopped doing the things you used to enjoy in order to drink.  You have to drink more than you used to in order to get the effect you want.  You experience anxiety, sweating, nausea, shakiness, and trouble sleeping when you try to stop drinking. Get help right away if:  You have thoughts about hurting yourself or others.  You have serious withdrawal symptoms, including: ? Confusion. ? Racing heart. ? High blood pressure. ? Fever. If you ever feel like you may hurt yourself or others, or have thoughts about taking your own life, get help right away. You can go to your nearest emergency department or call:  Your local emergency services (911 in the [place]  A suicide crisis helpline, such as the National Suicide Prevention  Lifeline at 1-800-273-8255. This is open 24 hours a day. Summary  Alcohol abuse and dependence can have a negative effect on your life. Drinking too much or too often can lead to addiction.  If you drink alcohol, limit how much you use.  If you are having trouble keeping your drinking under control, find ways to change your behavior. Hobbies, calming activities, exercise, or support groups can help.  If you feel you need help with changing your drinking habits, talk with your health care provider, a good friend, or a therapist, or go to an AA group. This information is not intended to replace advice given to you by your health care provider. Make sure you discuss any questions you have with your health care provider. Document Revised: 03/19/2019 Document Reviewed: 02/05/2019 Elsevier Patient Education  2020 [employer]  

## 2020-05-25 NOTE — Progress Notes (Signed)
Comprehensive Clinical Assessment (CCA) Note  05/25/2020 Thomas Hunter [id number]  Visit Diagnosis:      ICD-10-CM   1. Severe bipolar I disorder, current or most recent episode depressed ([place])  F31.4   2. Generalized anxiety disorder  F41.1   3. Alcohol abuse  F10.10   4. Mild episode of recurrent major depressive disorder (HCC)  F33.0   5. Alcohol dependence with uncomplicated withdrawal ([place])  F10.230   6. Alcohol-induced mood disorder (HCC)  F10.94      CCA Screening, Triage and Referral (STR)  Patient Reported Information Referral name: [facility]   Whom do you see for routine medical problems? Other (Comment)  What Do You Feel Would Help You the Most Today? Therapy;Medication   Have You Recently Been in Any Inpatient Treatment (Hospital/Detox/Crisis Center/28-Day Program)? No   Have You Ever Received Services From [facility] Before? Yes  Who Do You See at [facility]? ED   Have You Recently Had Any Thoughts About Hurting Yourself? Yes (April 20th)  Are You Planning to [place] At This time? No   Have you Recently Had Thoughts About Slaughter? No  Explanation: No data recorded  Have You Used Any Alcohol or Drugs in the Past 24 Hours? No  How Long Ago Did You Use Drugs or Alcohol? No data recorded  Do You Currently Have a Therapist/Psychiatrist? Yes  Name of Therapist/Psychiatrist: Needs establish appt with [facility] team  Have You Been Recently Discharged From Any Office Practice or Programs? No     CCA Screening Triage Referral Assessment Type of Contact: Face-to-Face  Patient Reported Information Reviewed? Yes  Collateral Involvement: none  Is CPS involved or ever been involved? Never  Is APS involved or ever been involved? Never  Patient Determined To Be At Risk for Harm To Self or Others Based on Review of Patient Reported Information or Presenting Complaint? No  Location of Assessment: [facility]  Assessment Services  Does Patient Present under Involuntary Commitment? No (Pt is required to see therapist through probation officer.)  [place] of Residence: [place]  Options For Referral: Outpatient Therapy;Medication Management   CCA Biopsychosocial  Intake/Chief Complaint:  CCA Intake With Chief Complaint CCA Part Two Date: 05/25/20 Chief Complaint/Presenting Problem: Alcohol abuse, anxiety, and bipolar disorder Patient's Currently Reported Symptoms/Problems: tremors, not sleeping well, irritablility, anxious, Individual's Strengths: hard worker, good dad Individual's Abilities: none reported Type of Services Patient Feels Are Needed: therapy and medication mgnt. Initial Clinical Notes/Concerns: Drinking  Mental Health Symptoms Depression:  Depression: Change in energy/activity, Increase/decrease in appetite  Mania:  Mania: Change in energy/activity, Increased Energy, Irritability, Racing thoughts, Recklessness  Anxiety:   Anxiety: Difficulty concentrating, Worrying  Psychosis:     Trauma:     Obsessions:     Compulsions:     Inattention:     Hyperactivity/Impulsivity:     Oppositional/Defiant Behaviors:     Emotional Irregularity:     Other Mood/Personality Symptoms:      Mental Status Exam Appearance and self-care  Stature:  Stature: Tall  Weight:  Weight: Average weight  Clothing:  Clothing: Casual  Grooming:  Grooming: Normal  Cosmetic use:  Cosmetic Use: None  Posture/gait:  Posture/Gait: Normal  Motor activity:     Sensorium  Attention:     Concentration:  Concentration: Normal  Orientation:  Orientation: X5  Recall/memory:  Recall/Memory: Normal  Affect and Mood  Affect:     Mood:     Relating  Eye  contact:  Eye Contact: Normal  Facial expression:     Attitude toward examiner:  Attitude Toward Examiner: Cooperative  Thought and Language  Speech flow: Speech Flow: Clear and Coherent  Thought content:  Thought Content: Appropriate to Mood and  Circumstances  Preoccupation:  Preoccupations: None  Hallucinations:     Organization:     Transport planner of Knowledge:  Fund of Knowledge: Average  Intelligence:  Intelligence: Average  Abstraction:  Abstraction: Normal  Judgement:  Judgement: Fair  Art therapist:  Reality Testing: Adequate  Insight:  Insight: Good  Decision Making:  Decision Making: Impulsive  Social Functioning  Social Maturity:  Social Maturity: Impulsive  Social Judgement:     Stress  Stressors:  Stressors: Family conflict, Grief/losses, Housing, Transport planner Ability:  Coping Ability: English as a second language teacher Deficits:  Skill Deficits: Environmental health practitioner, Responsibility, Self-control  Supports:  Supports: Family, Friends/Service system     Religion: Religion/Spirituality Are You A Religious Person?: Yes What is Your Religious Affiliation?: Personal assistant: Leisure / Recreation Do You Have Hobbies?: Yes Leisure and Hobbies: video games and excercise.  Exercise/Diet: Exercise/Diet Do You Exercise?: Yes What Type of Exercise Do You Do?: Weight Training How Many Times a Week Do You Exercise?: 4-5 times a week Have You Gained or Lost A Significant Amount of Weight in the Past Six Months?: No Do You Follow a Special Diet?: No Do You Have Any Trouble Sleeping?: Yes Explanation of Sleeping Difficulties: sleeping too long.   CCA Employment/Education  Employment/Work Situation: Employment / Work Situation Employment situation: Employed Where is patient currently employed?: [employer] How long has patient been employed?: 18 years Patient's job has been impacted by current illness: Yes Describe how patient's job has been impacted: sometimes run late, call offs, What is the longest time patient has a held a job?: 17 years Where was the patient employed at that time?: same as current Has patient ever been in the [employer]?: No  Education: Education Is Patient Currently Attending  School?: No Last Grade Completed: 10 Did Teacher, adult education From [employer]?: No Did You Nutritional therapist?: No Did Heritage manager?: No Did You Have An Individualized Education Program (IIEP): No Did You Have Any Difficulty At [employer]?: No Patient's Education Has Been Impacted by Current Illness: No   CCA Family/Childhood History  Family and Relationship History: Family history Marital status: Single Are you sexually active?: No Does patient have children?: Yes How many children?: 5 How is patient's relationship with their children?: good when he gets to see them.  Childhood History:  Childhood History By whom was/is the patient raised?: Grandparents Additional childhood history information: Father never around Description of patient's relationship with caregiver when they were a child: they past away 7 to 8 years ago Does patient have siblings?: Yes Number of Siblings: 1 Description of patient's current relationship with siblings: good relatioship Did patient suffer any verbal/emotional/physical/sexual abuse as a child?: No Did patient suffer from severe childhood neglect?: No Has patient ever been sexually abused/assaulted/raped as an adolescent or adult?: No Was the patient ever a victim of a crime or a disaster?: No Witnessed domestic violence?: No Has patient been affected by domestic violence as an adult?: No  Child/Adolescent Assessment:     CCA Substance Use  Alcohol/Drug Use: Alcohol / Drug Use Pain Medications: None  Prescriptions: See PTA list Over the Counter: None  History of alcohol / drug use?: Yes Longest period of sobriety (when/how long): Unknown Negative Consequences  of Use: Financial, Legal Withdrawal Symptoms: Agitation, Weakness, Sweats, Irritability, Tremors                         ASAM's:  Six Dimensions of Multidimensional Assessment  Dimension 1:  Acute Intoxication and/or Withdrawal Potential:   Dimension 1:  Description  of individual's past and current experiences of substance use and withdrawal: alcohol binge drinking 2 to 3 x weekly 1 pint when drinking occurs  Dimension 2:  Biomedical Conditions and Complications:      Dimension 3:  Emotional, Behavioral, or Cognitive Conditions and Complications:     Dimension 4:  Readiness to Change:     Dimension 5:  Relapse, Continued use, or Continued Problem Potential:     Dimension 6:  Recovery/Living Environment:     ASAM Severity Score:    ASAM Recommended Level of Treatment:     Substance use Disorder (SUD) Substance Use Disorder (SUD)  Checklist Symptoms of Substance Use: Continued use despite having a persistent/recurrent physical/psychological problem caused/exacerbated by use, Evidence of withdrawal (Comment), Persistent desire or unsuccessful efforts to cut down or control use, Social, occupational, recreational activities given up or reduced due to use  Recommendations for Services/Supports/Treatments: Recommendations for Services/Supports/Treatments Recommendations For Services/Supports/Treatments: Peer Support  DSM5 Diagnoses: Patient Active Problem List   Diagnosis Date Noted  . Closed fracture of medial portion of right tibial plateau with routine healing 07/03/2019  . Open fracture of right proximal tibia 04/24/2019  . Encounter to establish care 04/24/2019  . Alcohol-induced mood disorder ([place]) 03/06/2016  . Alcohol dependence with uncomplicated withdrawal ([place]) 03/06/2016  . Alcohol abuse 09/26/2015  . Generalized anxiety disorder 07/04/2013  . 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 Self-mutilation 07/04/2013    Client is a 34 year old male. Client is referred by Task  for a  Substance abuse disorder, anxiety, depression, and bipolar disorder with depression.  Client states mental health symptoms as evidenced by   alcohol use disorder: Persistent desire without obtaining from drinking, important work roles are being neglected due to  drinking, continued drinking although physical symptoms such as tremors occur.   GAD: excessive anxiety or worry, difficulty to control worrying, irritability.   Bipolar 2 with depression: irritable mood, grandiosity, distractibility, excessive drinking.  Client states Hx of of suicide behavior as recent as April cutting wrists. Suicide Risk Patient Safety plan conducted & completed. Pt agreeable  Client denies hallucinations and delusions at this time   Client was screened for the following SDOH: smoking, food, stress drinking, and depression   Assessment Information that integrates subjective and objective details with a therapist's professional interpretation: LCSW and pt met face to face. Pt was referred by [facility]ASK for substance abuse disorder. Pt was alert and oriented x 5. LCSW observed good hygiene, dressed casually and well engaged throughout assessment. Pt reported legal trouble for sneezing an officer and currently is out of probation for that insistent. Pt is complying with all terms of probation but due to decreased marijuna use because of monthly drug tests, pt has substitute that back with drinking which pt has a Hx of.        Client meets criteria for: Substance abuse disorder, anxiety, depression, and bipolar disorder with depression.   Client states use of the following substances alcohol, pt willing to start [place] meeting back and meet with therapist weekly.  Therapist will Assess individual's status and evaluate for psychiatric symptoms; Provide coping skills enhancement; Utilize evidence based practices  to address psychiatric symptoms; Provide therapeutic counseling and medication monitoring   Goals: Reduce overall level, frequency, and intensity of the anxiety so that daily functioning is not impaired; Enhance ability to handle effectively the full variety of life's anxieties. Elevate mood and show evidence of usual energy, activities, and socialization level.; Renew typical interest in  academic achievement, social involvement, and eating patterns as well as occasional expressions of joy and zest for life.; Reduce irritability and increase normal social interaction with family and friends.; Develop the ability to recognize, accept, and cope with feelings of depression Client will remain clean and sober from all mind-altering chemicals; Client will manage symptoms of diagnosis AEB decrease/increase in symptoms for at least 52 weeks Client will establish abstinence from all non-prescribed, mood-altering chemicals; Client will attend AA meetings a minimum of 4 times per week; Client will develop healthy coping skills to improve mood stability    Objective: Implement coping techniques deep breathing  in to weekly regiment, exercise outside of work 3 to 4 times weekly, Start attending [employer] weekly, Establish core triggers and implament new positive and less anxious thought process to decrease anxiety, depression and substance abuse        Treatment recommendations are include plan Devolop coping skills and create better understading of disgnosis, decrease drinking and smoking.     Clinician assisted client with scheduling the following appointments: 1 week. Clinician details of appointment.    Client agreed with treatment recommendations.  Patient Centered Plan: Patient is on the following Treatment Plan(s):  Anxiety, Depression and Substance Abuse    Dory Horn

## 2020-06-04 ENCOUNTER — Ambulatory Visit (INDEPENDENT_AMBULATORY_CARE_PROVIDER_SITE_OTHER): Payer: No Payment, Other | Admitting: Psychiatry

## 2020-06-04 ENCOUNTER — Encounter (HOSPITAL_COMMUNITY): Payer: Self-pay | Admitting: Psychiatry

## 2020-06-04 ENCOUNTER — Other Ambulatory Visit: Payer: Self-pay

## 2020-06-04 DIAGNOSIS — F319 Bipolar disorder, unspecified: Secondary | ICD-10-CM

## 2020-06-04 DIAGNOSIS — S02401A Maxillary fracture, unspecified, initial encounter for closed fracture: Secondary | ICD-10-CM | POA: Insufficient documentation

## 2020-06-04 DIAGNOSIS — F1094 Alcohol use, unspecified with alcohol-induced mood disorder: Secondary | ICD-10-CM | POA: Diagnosis not present

## 2020-06-04 MED ORDER — LAMOTRIGINE 25 MG PO TABS: 25.0000 mg | ORAL_TABLET | Freq: Every day | ORAL | 1 refills | Status: DC

## 2020-06-04 MED ORDER — MIRTAZAPINE 15 MG PO TABS: 15.0000 mg | ORAL_TABLET | Freq: Every day | ORAL | 1 refills | Status: DC

## 2020-06-04 MED FILL — lamoTRIgine 25 MG TABS: 25 | 30 days supply | Qty: 30 | Fill #0

## 2020-06-04 MED FILL — MIRTAZAPINE 15 MG TABLET: 15 | 30 days supply | Qty: 30 | Fill #0

## 2020-06-04 NOTE — Progress Notes (Signed)
Psychiatric Initial Adult Assessment   Patient Identification: Thomas Hunter MRN:  [id number] Date of Evaluation:  06/04/2020 Referral Source: [facility] Chief Complaint:  "I think I need to be on some medications" Visit Diagnosis:    ICD-10-CM   1. Bipolar I disorder (HCC)  F31.9 mirtazapine (REMERON) 15 MG tablet    lamoTRIgine (LAMICTAL) 25 MG tablet  2. Alcohol-induced mood disorder (HCC) Chronic F10.94 lamoTRIgine (LAMICTAL) 25 MG tablet    History of Present Illness: 34 year old male seen today for initial psychiatric evaluation.  He was referred to outpatient psychiatry by [facility] for medication management.  He has a history of anxiety, depression, alcohol-induced mood disorder, suicidal ideation/attempt, and bipolar disorder.  Patient reports that he has not taken any medication in over 2 years and notes that he has been feeling mentally unstable.  Today he endorses depressive symptoms such as insomnia, agitation, anxiety, disrupted sleep, and depressed mood.  He also notes that time he is distractible, has elevated mood, racing thoughts, impulsive spending, and irritable moods. Patient reports that he impulsivly buys lottery tickets and becomes frustrated with customers/ coworkers at work. He notes that he has been working at the [employer] for 17 years.   Patient notes that he has been on probation since April of 2021.  While on probation he reports that he has decreased his marijuana and alcohol consumption however notes that at times he finds it hard to go to sleep or calm down.  He wakes up every 2 hours at night.  Patient is agreeable to starting Lamictal 25 mg p.o. for 2 weeks and then increasing the dose to 50 mg for 2 weeks to help stabilize mood.  He is also agreeable to starting Remeron 15 mg at bedtime to help with depressive symptoms as well as sleep.  No other concerns noted at this time.   Associated Signs/Symptoms: Depression Symptoms:  insomnia, psychomotor  agitation, anxiety, disturbed sleep, (Hypo) Manic Symptoms:  Distractibility, Elevated Mood, Flight of Ideas, Community education officer, Irritable Mood, Anxiety Symptoms:  Excessive Worry, About probation Psychotic Symptoms:  Denies PTSD Symptoms: Had a traumatic exposure:  Childrens mother died in 01-Apr-2017  Past Psychiatric History: Suicidal ideation/attempt, Anxiety, Alcohol use disorder, depression, and Bipolar disorder  Previous Psychotropic Medications: Yes   Patient notes that zoloft was ineffective in the past. He also reports that he tried librium, Remeron, and gabapentin Substance Abuse History in the last 12 months:  Yes.    Consequences of Substance Abuse: Legal Consequences:  Probation  Past Medical History:  Past Medical History:  Diagnosis Date  . Anxiety   . Depression   . ETOH abuse   . GERD (gastroesophageal reflux disease)   . Gonorrhea Apr 02, 2015   History reviewed. No pertinent surgical history.  Family Psychiatric History: Unknown  Family History:  Family History  Problem Relation Age of Onset  . Hypertension Other   . Diabetes Other     Social History:   Social History   Socioeconomic History  . Marital status: Single    Spouse name: Not on file  . Number of children: Not on file  . Years of education: Not on file  . Highest education level: Not on file  Occupational History  . Occupation: Chief Executive Officer: [employer]  Tobacco Use  . Smoking status: Current Every Day Smoker    Packs/day: 0.50    Types: Cigarettes  . Smokeless tobacco: Never Used  Vaping Use  . Vaping Use: Former  . Quit  date: 04/22/2018  Substance and Sexual Activity  . Alcohol use: Yes    Comment: 1 pint per day   . Drug use: Yes    Types: Marijuana    Comment: crack  . Sexual activity: Not Currently    Partners: Female  Other Topics Concern  . Not on file  Social History Narrative   ** Merged History Encounter **       Social Determinants of Health    Financial Resource Strain: Low Risk   . Difficulty of Paying Living Expenses: Not hard at all  Food Insecurity: Food Insecurity Present  . Worried About Charity fundraiser in the Last Year: Sometimes true  . Ran Out of Food in the Last Year: Never true  Transportation Needs: No Transportation Needs  . Lack of Transportation (Medical): No  . Lack of Transportation (Non-Medical): No  Physical Activity: Sufficiently Active  . Days of Exercise per Week: 5 days  . Minutes of Exercise per Session: 60 min  Stress: Stress Concern Present  . Feeling of Stress : Rather much  Social Connections: Moderately Isolated  . Frequency of Communication with Friends and Family: More than three times a week  . Frequency of Social Gatherings with Friends and Family: More than three times a week  . Attends Religious Services: Never  . Active Member of Clubs or Organizations: Yes  . Attends Archivist Meetings: 1 to 4 times per year  . Marital Status: Separated    Additional Social History: Patient is currently single.  He has 5 children by 4 different women.  He currently works at [employer].  He endorses alcohol use (three 24 ounces of beer a day) and occasional marijuana use.  Allergies:  No Known Allergies  Metabolic Disorder Labs: No results found for: HGBA1C, MPG No results found for: PROLACTIN No results found for: CHOL, TRIG, HDL, CHOLHDL, VLDL, LDLCALC No results found for: TSH  Therapeutic Level Labs: No results found for: LITHIUM No results found for: CBMZ No results found for: VALPROATE  Current Medications: Current Outpatient Medications  Medication Sig Dispense Refill  . Calcium-Magnesium-Zinc 333-133-5 MG TABS Take 1 tablet by mouth every other day.    Marland Kitchen HYDROcodone-acetaminophen (NORCO/VICODIN) 5-325 MG tablet Take 1 tablet by mouth 3 (three) times daily as needed for moderate pain. (Patient not taking: Reported on 04/02/2020) 21 tablet 0  . ibuprofen (ADVIL) 800 MG  tablet Take 1 tablet (800 mg total) by mouth every 8 (eight) hours as needed. 21 tablet 0  . lamoTRIgine (LAMICTAL) 25 MG tablet Take 1 tablet (25 mg total) by mouth daily. 45 tablet 1  . lidocaine (LIDODERM) 5 % Place 1 patch onto the skin daily. Remove & Discard patch within 12 hours or as directed by MD (Patient not taking: Reported on 04/02/2020) 30 patch 0  . mirtazapine (REMERON) 15 MG tablet Take 1 tablet (15 mg total) by mouth at bedtime. 30 tablet 1  . Multiple Vitamin (MULTIVITAMIN WITH MINERALS) TABS tablet Take 1 tablet by mouth daily.    . nicotine (NICODERM CQ - DOSED IN MG/24 HOURS) 21 mg/24hr patch Place 1 patch (21 mg total) onto the skin daily. (Patient not taking: Reported on 04/02/2020) 28 patch 0  . penicillin v potassium (VEETID) 500 MG tablet Take 1 tablet (500 mg total) by mouth 4 (four) times daily. (Patient not taking: Reported on 04/02/2020) 28 tablet 0  . thiamine 100 MG tablet Take 1 tablet (100 mg total) by mouth daily. (  Patient not taking: Reported on 04/02/2020)    . traMADol (ULTRAM) 50 MG tablet Take 1 tablet (50 mg total) by mouth every 6 (six) hours as needed for severe pain. (Patient not taking: Reported on 04/02/2020) 15 tablet 0   No current facility-administered medications for this visit.    Musculoskeletal: Strength & Muscle Tone: within normal limits Gait & Station: normal Patient leans: N/A  Psychiatric Specialty Exam: Review of Systems  There were no vitals taken for this visit.There is no height or weight on file to calculate BMI.  General Appearance: Well Groomed  Eye Contact:  Good  Speech:  Clear and Coherent and Normal Rate  Volume:  Normal  Mood:  Anxious and Depressed  Affect:  Congruent  Thought Process:  Coherent, Goal Directed and Linear  Orientation:  Full (Time, Place, and Person)  Thought Content:  WDL and Logical  Suicidal Thoughts:  No  Homicidal Thoughts:  No  Memory:  Immediate;   Good Recent;   Good Remote;   Good   Judgement:  Good  Insight:  Good  Psychomotor Activity:  Normal  Concentration:  Concentration: Good and Attention Span: Good  Recall:  Good  Fund of Knowledge:Good  Language: Good  Akathisia:  No  Handed:  Right  AIMS (if indicated):  Not done  Assets:  Communication Skills Desire for Improvement Financial Resources/Insurance Housing  ADL's:  Intact  Cognition: WNL  Sleep:  Fair   Screenings: AUDIT     Counselor from 05/25/2020 in [facility] Admission (Discharged) from 07/02/2013 in [facility]  Alcohol Use Disorder Identification Test Final Score (AUDIT) 32 29    CAGE-AID     Counselor from 05/25/2020 in [facility]  CAGE-AID Score 4    GAD-7     Office Visit from 04/23/2019 in [place]  Total GAD-7 Score 1    PHQ2-9     Counselor from 05/25/2020 in [facility] Office Visit from 04/23/2019 in [place]  PHQ-2 Total Score 2 0  PHQ-9 Total Score 9 0      Assessment and Plan: Patient endorses depression, anxiety, and symptoms of hypomania. He is agreeable to starting Lamictal 25 mg p.o. for 2 weeks and then increasing the dose to 50 mg for 2 weeks to help stabilize mood.  He is also agreeable to starting Remeron 15 mg at bedtime to help with depressive symptoms as well as sleep. 1. Bipolar I disorder ([place])  Start- mirtazapine (REMERON) 15 MG tablet; Take 1 tablet (15 mg total) by mouth at bedtime.  Dispense: 30 tablet; Refill: 1 Start- lamoTRIgine (LAMICTAL) 25 MG tablet; Take 1 tablet (25 mg total) by mouth daily.  Dispense: 45 tablet; Refill: 1  2. Alcohol-induced mood disorder (HCC)  Start- lamoTRIgine (LAMICTAL) 25 MG tablet; Take 1 tablet (25 mg total) by mouth daily.  Dispense: 45 tablet; Refill: 1   Follow-up in 1 month  Salley Slaughter, NP 6/24/20214:29 PM

## 2020-06-05 ENCOUNTER — Ambulatory Visit (INDEPENDENT_AMBULATORY_CARE_PROVIDER_SITE_OTHER): Payer: No Payment, Other | Admitting: Licensed Clinical Social Worker

## 2020-06-05 ENCOUNTER — Encounter (HOSPITAL_COMMUNITY): Payer: Self-pay | Admitting: Licensed Clinical Social Worker

## 2020-06-05 DIAGNOSIS — F411 Generalized anxiety disorder: Secondary | ICD-10-CM

## 2020-06-05 DIAGNOSIS — F319 Bipolar disorder, unspecified: Secondary | ICD-10-CM

## 2020-06-05 DIAGNOSIS — F3181 Bipolar II disorder: Secondary | ICD-10-CM

## 2020-06-05 NOTE — Progress Notes (Signed)
   THERAPIST PROGRESS NOTE  Session Time: 60 min face to face   Participation Level: Active  Behavioral Response: CasualAlertEuphoric  Type of Therapy: Individual Therapy  Treatment Goals addressed: Coping  Interventions: CBT  Summary: Thomas Hunter is a 34 y.o. male who presents with bipolar disorder.  Subjective: Pt reports recent medication changes that started yesterday. Thomas Hunter states " I slept like a baby last night, almost did not wake up this morning" he reports that he may have taken the medication a little late and did have 1 bud light while fishing earlier in the evening while fishing. LCSW spoke to pt about drinking and drug use although marijuana use was last reported on the 13th of June pt reports drinking as early as yesterday evening.   Objective: Pt was alert and oriented x 5. Engaged well throughout assessment with adequate eye contact in a slump posture in the chair.   Assessment: Thomas Hunter endorses a decrease in depressive symptoms including insomnia and disrupted sleep. This maybe attributed to new medication regiment. Pt asked to keep a journal of symptoms of the next 2 weeks for own records and for Dr. Ronne Binning for their next visit on the 27th of July.   Plan: Pt to write down 5 positive things he likes about himself, track drinking over the next 3 weeks, and write down 2 to 3 coping mechanism he feels would be beneficial for Tx.   Suicidal/Homicidal: Nowithout intent/plan    Plan: Return again in 3 weeks.  Diagnosis: Axis I: Bipolar, mixed        Dory Horn, LCSW 06/05/2020

## 2020-06-05 NOTE — Patient Instructions (Signed)
Managing Bipolar Disorder When someone is diagnosed with bipolar disorder, the person may be relieved to now know why he or she has felt or behaved a certain way. The person may also feel overwhelmed about the treatment ahead, how to get needed support, and how to deal with the condition each day. With care and support, a person with bipolar disorder can learn to manage his or her symptoms and live with the condition. How to manage lifestyle changes Managing stress Stress is your body's reaction to life changes and events, both good and bad. Stress can play a major role in bipolar disorder, so it is important to learn how to manage stress. Some techniques to help you manage stress include:  Meditation, muscle relaxation, and breathing exercises.  Exercise. Even a short daily walk can help to lower stress levels.  Getting enough good-quality sleep. Too little sleep can cause mania to start (can trigger mania).  Making a schedule to manage your time. Knowing your daily schedule can help to keep you from feeling overwhelmed by tasks and deadlines.  Spending time on hobbies you enjoy.  Medicines Your health care provider may suggest certain medicines if he or she feels that they will help improve your condition. Avoid using caffeine, alcohol, and other substances that may prevent your medicines from working properly. It is also important to:  Talk with your pharmacist or health care provider about all the medicines that you take, their possible side effects, and which medicines are safe to take together.  Make it your goal to take part in all treatment decisions (shared decision-making). Ask about possible side effects of medicines that your health care provider recommends, and tell him or her how you feel about having those side effects. It is best if shared decision-making with your health care provider is part of your total treatment plan. If you are taking medicines as part of your treatment,  do not stop taking medicines before you ask your health care provider if it is safe to stop. You may need to have the medicine slowly decreased (tapered) over time to lower the risk of harmful side effects. Relationships Spend time with people whom you trust and with whom you feel a sense of understanding and calm. Try to find friends or family members who make you feel safe and can help you control feelings of mania. Consider going to couples counseling, family education classes, or family therapy to:  Educate your loved ones about your condition and offer suggestions about how they can support you.  Help resolve conflicts.  Help develop communication skills in your relationships.  How to recognize changes in your condition Everyone responds differently to treatment for bipolar disorder. Some signs that your condition is improving include:  Leveling of your mood. You may have less anger and excitement about daily activities, and your low moods may not be as bad.  Your symptoms being less intense.  Feeling calm more often.  Thinking clearly.  Not experiencing consequences for extreme behavior.  Feeling like your life is settling down.  Your behavior seeming more normal to you and to other people. Some signs that your condition may be getting worse include:  Sleep problems.  Moods cycling between deep lows and unusually high (excess) energy.  Extreme emotions.  More anger at loved ones.  Staying away from others, or isolating yourself.  A feeling of power or superiority.  Completing a lot of tasks in a very short amount of time.  Unusual thoughts  and behaviors.  Suicidal thoughts. Follow these instructions at home: Medicines  Take over-the-counter and prescription medicines only as told by your health care provider or pharmacist.  Ask your pharmacist what over-the-counter cold medicines you should avoid. Some medicines can make symptoms worse. General  instructions  Ask for support from trusted family members or friends to make sure you stay on track with your treatment.  Keep a journal to write down your daily moods, medicines, sleep habits, and life events. This may help you have more success with your treatment.  Make and follow a routine for daily meal times. Eat healthy foods, such as whole grains, vegetables, and fresh fruit.  Try to go to sleep and wake up around the same time every day.  Keep all follow-up visits as told by your health care provider. This is important. Where to find support Talking to others  Try making a list of the people you may want to tell about your condition, such as the people you trust most.  Plan what you are willing and not willing to talk about. Think about your needs ahead of time, and how your friends and family members can support you.  Let your loved ones know when they can share advice and when you would just like them to listen.  Give your loved ones information about bipolar disorder, and encourage them to learn about the condition. Finances Not all insurance plans cover mental health care, so it is important to check with your insurance carrier. If paying for co-pays or counseling services is a problem, search for a local or county mental health care center. Public mental health care services may be offered there at a low cost or no cost when you are not able to see a private health care provider. If you are taking medicine for depression, you may be able to get the generic form, which may be less expensive than brand-name medicine. Some makers of prescription medicines also offer help to patients who cannot afford the medicines they need. Questions to ask your health care provider:  If you are taking medicines: ? How long do I need to take medicine? ? Are there any long-term side effects of my medicine? ? Are there any alternatives to taking medicine?  How would I benefit from  therapy?  How often should I follow up with a health care provider? Contact a health care provider if:  Your symptoms get worse or they do not get better with treatment. Get help right away if:  You have thoughts about harming yourself or others. If you ever feel like you may hurt yourself or others, or have thoughts about taking your own life, get help right away. You can go to your nearest emergency department or call:  Your local emergency services (911 in the [place]  A suicide crisis helpline, such as the [place] at 762-557-2487. This is open 24-hours a day. Summary  Learning ways to manage stress can help to calm you and may also help your treatment work better.  There is a wide range of medicines that can help to treat bipolar disorder.  Having healthy relationships can help to make your moods more stable.  Contact a health care provider if your symptoms get worse or they do not get better with treatment. This information is not intended to replace advice given to you by your health care provider. Make sure you discuss any questions you have with your health care provider. Document Revised:  03/22/2019 Document Reviewed: 03/30/2017 Elsevier Patient Education  [place].

## 2020-06-21 ENCOUNTER — Emergency Department (HOSPITAL_COMMUNITY)
Admission: EM | Admit: 2020-06-21 | Discharge: 2020-06-22 | Disposition: A | Discharge status: Home / Self Care | Payer: Medicaid Other | Attending: Emergency Medicine | Admitting: Emergency Medicine

## 2020-06-21 ENCOUNTER — Encounter (HOSPITAL_COMMUNITY): Payer: Self-pay | Admitting: Emergency Medicine

## 2020-06-21 ENCOUNTER — Other Ambulatory Visit: Payer: Self-pay

## 2020-06-21 DIAGNOSIS — F332 Major depressive disorder, recurrent severe without psychotic features: Secondary | ICD-10-CM | POA: Insufficient documentation

## 2020-06-21 DIAGNOSIS — Z79899 Other long term (current) drug therapy: Secondary | ICD-10-CM | POA: Insufficient documentation

## 2020-06-21 DIAGNOSIS — F17210 Nicotine dependence, cigarettes, uncomplicated: Secondary | ICD-10-CM | POA: Insufficient documentation

## 2020-06-21 DIAGNOSIS — Z20822 Contact with and (suspected) exposure to covid-19: Secondary | ICD-10-CM | POA: Insufficient documentation

## 2020-06-21 DIAGNOSIS — Z046 Encounter for general psychiatric examination, requested by authority: Secondary | ICD-10-CM | POA: Insufficient documentation

## 2020-06-21 DIAGNOSIS — R4689 Other symptoms and signs involving appearance and behavior: Secondary | ICD-10-CM

## 2020-06-21 LAB — RAPID URINE DRUG SCREEN, HOSP PERFORMED
Amphetamines: NOT DETECTED
Barbiturates: NOT DETECTED
Benzodiazepines: NOT DETECTED
Cocaine: POSITIVE — AB
Opiates: NOT DETECTED
Tetrahydrocannabinol: NOT DETECTED

## 2020-06-21 LAB — COMPREHENSIVE METABOLIC PANEL
ALT: 25 U/L (ref 0–44)
AST: 31 U/L (ref 15–41)
Albumin: 4.8 g/dL (ref 3.5–5.0)
Alkaline Phosphatase: 45 U/L (ref 38–126)
Anion gap: 14 (ref 5–15)
BUN: 9 mg/dL (ref 6–20)
CO2: 23 mmol/L (ref 22–32)
Calcium: 9.2 mg/dL (ref 8.9–10.3)
Chloride: 105 mmol/L (ref 98–111)
Creatinine, Ser: 0.96 mg/dL (ref 0.61–1.24)
GFR calc Af Amer: >60 mL/min (ref >60)
GFR calc non Af Amer: >60 mL/min (ref >60)
Glucose, Bld: 103 mg/dL — ABNORMAL HIGH (ref 70–99)
Potassium: 3.8 mmol/L (ref 3.5–5.1)
Sodium: 142 mmol/L (ref 135–145)
Total Bilirubin: 0.6 mg/dL (ref 0.3–1.2)
Total Protein: 7.5 g/dL (ref 6.5–8.1)

## 2020-06-21 LAB — CBC
HCT: 51.2 % (ref 39.0–52.0)
Hemoglobin: 17 g/dL (ref 13.0–17.0)
MCH: 33.1 pg (ref 26.0–34.0)
MCHC: 33.2 g/dL (ref 30.0–36.0)
MCV: 99.6 fL (ref 80.0–100.0)
Platelets: 299 10*3/uL (ref 150–400)
RBC: 5.14 MIL/uL (ref 4.22–5.81)
RDW: 12.7 % (ref 11.5–15.5)
WBC: 12.9 10*3/uL — ABNORMAL HIGH (ref 4.0–10.5)
nRBC: 0 % (ref 0.0–0.2)

## 2020-06-21 LAB — ACETAMINOPHEN LEVEL: Acetaminophen (Tylenol), Serum: <10 ug/mL — ABNORMAL LOW (ref 10–30)

## 2020-06-21 LAB — SALICYLATE LEVEL: Salicylate Lvl: <7 mg/dL — ABNORMAL LOW (ref 7.0–30.0)

## 2020-06-21 LAB — ETHANOL: Alcohol, Ethyl (B): 321 mg/dL (ref <10)

## 2020-06-21 MED ORDER — BENZTROPINE MESYLATE 1 MG PO TABS: 1.0000 mg | ORAL_TABLET | Freq: Four times a day (QID) | ORAL | Status: DC | PRN

## 2020-06-21 MED ORDER — ALUM & MAG HYDROXIDE-SIMETH 200-200-20 MG/5ML PO SUSP: 30.0000 mL | Freq: Four times a day (QID) | ORAL | Status: DC | PRN

## 2020-06-21 MED ORDER — HALOPERIDOL 5 MG PO TABS: 5.0000 mg | ORAL_TABLET | Freq: Four times a day (QID) | ORAL | Status: DC | PRN

## 2020-06-21 MED ORDER — ZIPRASIDONE MESYLATE 20 MG IM SOLR
20.0000 mg | Freq: Once | INTRAMUSCULAR | Status: AC
  Administered 2020-06-21: 20 mg via INTRAMUSCULAR
  Filled 2020-06-21: qty 20

## 2020-06-21 MED ORDER — STERILE WATER FOR INJECTION IJ SOLN
INTRAMUSCULAR | Status: AC
  Administered 2020-06-21: 1 mL
  Filled 2020-06-21: qty 10

## 2020-06-21 NOTE — ED Triage Notes (Addendum)
Pt to ED via [employer] after pt's mother took out IVC papers on him.  Superficial lacerations to bilateral upper arms from a box cutter and swiss army knife.  Pt has not been taking medications.  + ETOH.  Pt denies SI/HI and states "I just like to cut myself."

## 2020-06-21 NOTE — BH Assessment (Signed)
Per Mackey Birchwood, RN, pt is currently sleeping. RN to check pt in 30 minutes and will call if pt is alert and able to engage. Clinician provided call back number.    Vertell Novak, MS, [facility], [facility] Triage Specialist [phone].

## 2020-06-21 NOTE — ED Notes (Signed)
Staffing notified about need for sitter. No sitters available at this time.

## 2020-06-21 NOTE — ED Provider Notes (Signed)
Pt awake and eating at 7pm.  Pt denies any current complaints.  Pt reports he is feeling better.  TTS to see and evaluate    Thomas Hunter 06/21/20 2129    Pattricia Boss, MD 06/22/20 1216

## 2020-06-21 NOTE — ED Notes (Signed)
Pt changed into purple scrubs and belongings inventoried and place in locker 5.  Pt to be wanded by security [employer] at bedside

## 2020-06-21 NOTE — ED Notes (Signed)
Pt screaming "f*uck you stop staring at me" to officers at bedside. He then stated he wanted to leave and got up and started walking. PD redirected pt back to bed. HE then request to use restroom. Pt escorted to restroom by PD and then returned to bed. Pt now screaming "f*ck you p*ussy ass b*tch cops" and punched floor hard with right hand.

## 2020-06-21 NOTE — ED Notes (Signed)
Reports given to purple RN

## 2020-06-21 NOTE — ED Notes (Signed)
Pt wanded by security. 

## 2020-06-21 NOTE — ED Notes (Signed)
Pt in hall bed yelling "this is horse shit" PD at bedside.

## 2020-06-21 NOTE — BHH Counselor (Signed)
Attempted to assess Pt.  Pt was sedated on Geodon, sleeping.  Spoke with attending RN, who advised she will call when Pt is awake.

## 2020-06-21 NOTE — ED Provider Notes (Signed)
[facility] Provider Note   CSN: [id number] Arrival date & time: 06/21/20  0920     History Chief Complaint  Patient presents with  . IVC    Thomas Hunter is a 34 y.o. male.  HPI   Patient is a 34 year old male with a medical history as noted below.  He presents today via [employer] under IVC.  Patient was placed under IVC by his mother.  Per note, patient was in an argument and was intoxicated.  Used a box cutter and a Research scientist (physical sciences) to cut both of his upper extremities multiple times.  He has "not been taking his medications".  Patient denied SI/HI to hospital staff.  Patient notes history of crack cocaine use.  When I started talking to the patient he became tearful.  He notes SI/HI to me.  He denies any plan.  He does not report HI towards any specific person but states "there are people that I want to hurt". Pt denies a plan in regards to his SI.  He denies any visual or auditory hallucinations.  Patient then became extremely angry and started slamming his fist together.  He threatened myself, the please officers, the nursing staff.  Since, he has been screaming profanities throughout the emergency department.     Past Medical History:  Diagnosis Date  . Anxiety   . Depression   . ETOH abuse   . GERD (gastroesophageal reflux disease)   . Gonorrhea 2016    Patient Active Problem List   Diagnosis Date Noted  . Maxillary sinus fracture, closed, initial encounter ([place]) 06/04/2020  . Closed fracture of medial portion of right tibial plateau with routine healing 07/03/2019  . Open fracture of right proximal tibia 04/24/2019  . Encounter to establish care 04/24/2019  . 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 Alcohol dependence with uncomplicated withdrawal ([place]) 03/06/2016  . Alcohol abuse 09/26/2015  . Generalized anxiety disorder 07/04/2013  . 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 Self-mutilation 07/04/2013    History  reviewed. No pertinent surgical history.     Family History  Problem Relation Age of Onset  . Hypertension Other   . Diabetes Other     Social History   Tobacco Use  . Smoking status: Current Every Day Smoker    Packs/day: 0.75    Types: Cigarettes  . Smokeless tobacco: Never Used  Vaping Use  . Vaping Use: Former  . Quit date: 04/22/2018  Substance Use Topics  . Alcohol use: Yes    Comment: 1 pint per day   . Drug use: Yes    Types: Marijuana    Comment: crack    Home Medications Prior to Admission medications   Medication Sig Start Date End Date Taking? Authorizing Provider  Calcium-Magnesium-Zinc [id number] MG TABS Take 1 tablet by mouth every other day.    [provider]  HYDROcodone-acetaminophen (NORCO/VICODIN) 5-325 MG tablet Take 1 tablet by mouth 3 (three) times daily as needed for moderate pain. Patient not taking: Reported on 04/02/2020 07/03/19   Suzan Slick, NP  ibuprofen (ADVIL) 800 MG tablet Take 1 tablet (800 mg total) by mouth every 8 (eight) hours as needed. 04/23/20   Darr, Marguerita Beards, PA-C  lamoTRIgine (LAMICTAL) 25 MG tablet Take 1 tablet (25 mg total) by mouth daily. 06/04/20   Eulis Canner E, NP  lidocaine (LIDODERM) 5 % Place 1 patch onto the skin daily. Remove & Discard patch within 12 hours or as directed  by MD Patient not taking: Reported on 04/02/2020 12/08/18   Nuala Alpha A, PA-C  mirtazapine (REMERON) 15 MG tablet Take 1 tablet (15 mg total) by mouth at bedtime. 06/04/20   Salley Slaughter, NP  Multiple Vitamin (MULTIVITAMIN WITH MINERALS) TABS tablet Take 1 tablet by mouth daily.    [provider]  nicotine (NICODERM CQ - DOSED IN MG/24 HOURS) 21 mg/24hr patch Place 1 patch (21 mg total) onto the skin daily. Patient not taking: Reported on 04/02/2020 03/07/16   Niel Hummer, NP  penicillin v potassium (VEETID) 500 MG tablet Take 1 tablet (500 mg total) by mouth 4 (four) times daily. Patient not taking: Reported on  04/02/2020 11/27/19   Dalia Heading, PA-C  thiamine 100 MG tablet Take 1 tablet (100 mg total) by mouth daily. Patient not taking: Reported on 04/02/2020 03/07/16   Niel Hummer, NP  traMADol (ULTRAM) 50 MG tablet Take 1 tablet (50 mg total) by mouth every 6 (six) hours as needed for severe pain. Patient not taking: Reported on 04/02/2020 11/27/19   Dalia Heading, PA-C    Allergies    Patient has no known allergies.  Review of Systems   Review of Systems  Unable to perform ROS: Other   Physical Exam Updated Vital Signs BP 137/88 (BP Location: Right Arm)   Pulse (!) 104   Temp 98.1 F (36.7 C) (Oral)   Resp 20   SpO2 94%   Physical Exam Vitals and nursing note reviewed.  Constitutional:      General: He is not in acute distress.    Appearance: He is well-developed.  HENT:     Head: Normocephalic and atraumatic.     Right Ear: External ear normal.     Left Ear: External ear normal.  Eyes:     General: No scleral icterus.       Right eye: No discharge.        Left eye: No discharge.     Conjunctiva/sclera: Conjunctivae normal.  Neck:     Trachea: No tracheal deviation.  Cardiovascular:     Rate and Rhythm: Normal rate.  Pulmonary:     Effort: Pulmonary effort is normal. No respiratory distress.     Breath sounds: No stridor.  Abdominal:     General: There is no distension.  Musculoskeletal:        General: No swelling or deformity.     Cervical back: Neck supple.  Skin:    General: Skin is warm and dry.     Findings: No rash.     Comments: Multiple superficial lacerations to the bilateral upper extremities with no active bleeding.  Old scars in the prior mentioned sites from prior cuts.  Neurological:     Mental Status: He is alert.     Cranial Nerves: Cranial nerve deficit: no gross deficits.  Psychiatric:        Attention and Perception: He does not perceive auditory or visual hallucinations.        Mood and Affect: Affect is blunt, angry, tearful  and inappropriate.        Behavior: Behavior is agitated, aggressive and combative.        Thought Content: Thought content is not delusional. Thought content includes homicidal and suicidal ideation. Thought content does not include homicidal or suicidal plan.    ED Results / Procedures / Treatments   Labs (all labs ordered are listed, but only abnormal results are displayed) Labs Reviewed  COMPREHENSIVE METABOLIC  PANEL - Abnormal; Notable for the following components:      Result Value   Glucose, Bld 103 (*)    All other components within normal limits  ETHANOL - Abnormal; Notable for the following components:   Alcohol, Ethyl (B) 321 (*)    All other components within normal limits  SALICYLATE LEVEL - Abnormal; Notable for the following components:   Salicylate Lvl <9.1 (*)    All other components within normal limits  ACETAMINOPHEN LEVEL - Abnormal; Notable for the following components:   Acetaminophen (Tylenol), Serum <10 (*)    All other components within normal limits  CBC - Abnormal; Notable for the following components:   WBC 12.9 (*)    All other components within normal limits  RAPID URINE DRUG SCREEN, HOSP PERFORMED - Abnormal; Notable for the following components:   Cocaine POSITIVE (*)    All other components within normal limits  SARS CORONAVIRUS 2 BY RT PCR (HOSPITAL ORDER, [facility])   EKG None  Radiology No results found.  Procedures Procedures   Medications Ordered in ED Medications  ziprasidone (GEODON) injection 20 mg (20 mg Intramuscular Given 06/21/20 1049)  sterile water (preservative free) injection (1 mL  Given 06/21/20 1050)    ED Course  I have reviewed the triage vital signs and the nursing notes.  Pertinent labs & imaging results that were available during my care of the patient were reviewed by me and considered in my medical decision making (see chart for details).  Clinical Course as of Jun 21 1129  Sun Jun 21, 2020  1107 Alcohol, Ethyl (B)(!!): 321 [LJ]  1107 Acetaminophen (Tylenol), S(!): <47 [LJ]  8295 Salicylate Lvl(!): <6.2 [LJ]  1107 Anion gap: 14 [LJ]  1129 Patient became extremely agitated after my exam and was given 20 mg of IM Geodon.  He is lying comfortably in bed.  Awaiting TTS consult.   [LJ]  38 COCAINE(!): POSITIVE [LJ]    Clinical Course User Index [LJ] Rayna Sexton, PA-C   MDM Rules/Calculators/A&P                          Pt is a 34 y.o. male that present with a history, physical exam, ED Clinical Course as noted above.   Patient was extremely aggressive with staff.  Patient was given IM Geodon and is sleeping comfortably in bed.  Awaiting TTS consult. It is the end of my shift and pt care will be transferred to [facility].   Note: Portions of this report may have been transcribed using voice recognition software. Every effort was made to ensure accuracy; however, inadvertent computerized transcription errors may be present.   Final Clinical Impression(s) / ED Diagnoses Final diagnoses:  Involuntary commitment  Aggressive behavior    Rx / DC Orders ED Discharge Orders    None       Rayna Sexton, PA-C 06/21/20 1521    Quintella Reichert, MD 06/22/20 1257

## 2020-06-21 NOTE — ED Notes (Signed)
Pt remains agitated, screaming "f*uck all of yall, let me go home, I want to go home. F*uck all you. Fu*uck you". PT in cuffs with PD and security at bedside. Charge RN and MD aware of situation.

## 2020-06-22 ENCOUNTER — Emergency Department (HOSPITAL_COMMUNITY): Payer: Medicaid Other

## 2020-06-22 LAB — SARS CORONAVIRUS 2 BY RT PCR (HOSPITAL ORDER, PERFORMED IN ~~LOC~~ HOSPITAL LAB): SARS Coronavirus 2: NEGATIVE

## 2020-06-22 NOTE — ED Provider Notes (Signed)
Emergency Medicine Observation Re-evaluation Note  Thomas Hunter is a 34 y.o. male, seen on rounds today.  Pt initially presented to the ED for complaints of IVC Currently, the patient is sleeping  Physical Exam  BP 133/85 (BP Location: Left Arm)   Pulse 84   Temp 98.2 F (36.8 C) (Oral)   Resp 18   SpO2 98%  Physical Exam Constitutional:      Appearance: He is not ill-appearing.  HENT:     Head: Normocephalic.  Pulmonary:     Effort: Pulmonary effort is normal.  Abdominal:     General: There is no distension.     ED Course / MDM  EKG:  Clinical Course as of Jun 22 1222  Sun Jun 21, 2020  1107 Alcohol, Ethyl (B)(!!): 321 [LJ]  1107 Acetaminophen (Tylenol), S(!): <94 [LJ]  8546 Salicylate Lvl(!): <2.7 [LJ]  1107 Anion gap: 14 [LJ]  1129 Patient became extremely agitated after my exam and was given 20 mg of IM Geodon.  He is lying comfortably in bed.  Awaiting TTS consult.   [LJ]  0350 KXFGHWE(!): POSITIVE [LJ]    Clinical Course User Index [LJ] Rayna Sexton, PA-C   I have reviewed the labs performed to date as well as medications administered while in observation.  Recent changes in the last 24 hours include N/A. Plan  Current plan is for Discharge. Patient is under full IVC at this time.  IVC is being Rescinded. Patient ambulatory this morning without atiaxia or evidence of intoxication.   Margarita Mail, PA-C 06/22/20 1246    Milton Ferguson, MD 06/22/20 [phone]

## 2020-06-22 NOTE — ED Notes (Signed)
Pt. Still resting. Nurse asked pt. If he wanted to get up and speak with behavioral health. Pt. Declined, stated he was tired.

## 2020-06-22 NOTE — ED Notes (Signed)
[facility] reassessing via TTS at this time.

## 2020-06-22 NOTE — BHH Counselor (Signed)
Clinician spoke to [facility], RN and noted the pt is still sleeping. RN to cal once pt is alert and able to engage.    Vertell Novak, MS, [facility], [facility] Triage Specialist [phone].

## 2020-06-22 NOTE — Discharge Instructions (Addendum)
Return for any new or worsening symptoms.

## 2020-06-22 NOTE — BH Assessment (Addendum)
Comprehensive Clinical Assessment (CCA) Screening, Triage and Referral Note  06/22/2020 Thomas Hunter [id number] Patient presents with IVC by family member per IVC patient was making statements of self harm and was noted to be impaired at that time. Patient denies any S/I, H/I or AVH at the time of assessment. Patient had a noted BAL of 321 on arrival and was positive for cocaine. Patient denies any previous attempts or gestures at self harm. Patient reports ongoing SA issues to include: using alcohol in various amounts with last use on 06/21/20 when he reported he consumed "a few beers." Patient also reported sporadic cocaine use stating he used for the first time "a couple weeks ago."  Patient is vague in reference to use patterns of both substances although reported using cocaine (unknown amount) on 06/21/20. Patient's UDS was positive for cocaine this date. Patient cannot identify any immediate stressors associated with him presenting. When asked patient states "everything." Patient states he was diagnosed with Bipolar depression over 5 years ago and has been receiving services from [facility] who assists with medication management. Patient states he takes his medications "off and on" although per notes below has started back taking medications (see note below). Patient cannot recall the last time he was compliant with that regimen prior to 06/04/20. Patient has a history of depression although denies current symptoms. Patient also reports a history of cutting with noted superficial scratches/cuts observed on his left upper arm. Patient reports he self inflicted those injuries when he was impaired on 06/21/20 and denies that was a attempt to take his life. Patient denies any previous attempts or gestures at self harm. Patient reports he self inflicts with various edged objects "when he is stressed." Patient reports that has not "been more than a coupler times in the last year."             Per notes patient was  last seen on 06/04/20 when he presented for a office visit at [facility]. Per that note Ronne Binning NP writes: 34 year old male seen today for initial psychiatric evaluation.  He was referred to outpatient psychiatry by [facility] for medication management.  He has a history of anxiety, depression, alcohol-induced mood disorder, suicidal ideation/attempt, and bipolar disorder. Patient reports that he has not taken any medication in over 2 years and notes that he has been feeling mentally unstable.  Today he endorses depressive symptoms such as insomnia, agitation, anxiety, disrupted sleep, and depressed mood.  He also notes that time he is distractible, has elevated mood, racing thoughts, impulsive spending, and irritable moods. Patient reports that he impulsively buys lottery tickets and becomes frustrated with customers/ coworkers at work. He notes that he has been working at the [employer] for 17 years.   Patient notes that he has been on probation since April of 2021.  While on probation he reports that he has decreased his marijuana and alcohol consumption however notes that at times he finds it hard to go to sleep or calm down.  He wakes up every 2 hours at night. Patient is agreeable to starting Lamictal 25 mg p.o. for 2 weeks and then increasing the dose to 50 mg for 2 weeks to help stabilize mood.  He is also agreeable to starting Remeron 15 mg at bedtime to help with depressive symptoms as well as sleep.  No other concerns noted at this time.  This Probation officer gathered collateral from patient's mother Grant Ruts with patient's permission who did not voice any  safety concerns and will assist with support on discharge.   Patient is oriented x 4 and speaks in a normal voice. Patient's memory is intact and thoughts organized. Patient does not appear to be responding to internal stimuli. Patient is requesting to be discharged and is contacting for safety. Kumar MD recommended  patient's IVC be rescinded and patient be discharged. Patient will follow up with [facility] for ongoing needs.        Visit Diagnosis:  Bipolar I disorder (per notes)  Alcohol abuse, Cocaine use    ICD-10-CM   1. Involuntary commitment  Z04.6   2. Aggressive behavior  R46.89     Patient Reported Information How did you hear about [place]? Other (Comment) (Pt is with IVC)   Referral name: Pt is with IVC by mother   Referral phone number: No data recorded Whom do you see for routine medical problems? I don't have a doctor   Practice/Facility Name: No data recorded  Practice/Facility Phone Number: No data recorded  Name of Contact: No data recorded  Contact Number: No data recorded  Contact Fax Number: No data recorded  Prescriber Name: No data recorded  Prescriber Address (if known): No data recorded What Is the Reason for Your Visit/Call Today? SA issues and S/I  How Long Has This Been Causing You Problems? 1-6 months  Have You Recently Been in Any Inpatient Treatment (Hospital/Detox/Crisis Center/28-Day Program)? No   Name/Location of Program/Hospital:No data recorded  How Long Were You There? No data recorded  When Were You Discharged? No data recorded Have You Ever Received Services From [facility] Before? No   Who Do You See at [facility]? ED  Have You Recently Had Any Thoughts About Hurting Yourself? No   Are You Planning to Commit Suicide/Harm Yourself At This time?  No  Have you Recently Had Thoughts About [place]? No   Explanation: No data recorded Have You Used Any Alcohol or Drugs in the Past 24 Hours? Yes   How Long Ago Did You Use Drugs or Alcohol?  No data recorded  What Did You Use and How Much? Alcohol and Cocaine unknown amount  What Do You Feel Would Help You the Most Today? Therapy;Medication  Do You Currently Have a Therapist/Psychiatrist? No   Name of Therapist/Psychiatrist: Needs establish appt with [facility] team   Have You Been Recently Discharged From Any Office Practice or Programs? No   Explanation of Discharge From Practice/Program:  No data recorded    CCA Screening Triage Referral Assessment Type of Contact: Tele-Assessment   Is this Initial or Reassessment? Initial Assessment   Date Telepsych consult ordered in [facility]:  06/21/20   Time Telepsych consult ordered in [facility]:  No data recorded Patient Reported Information Reviewed? Yes   Patient Left Without Being Seen? No data recorded  Reason for Not Completing Assessment: No data recorded Collateral Involvement: From mother with patient consent  Does Patient Have a Court Appointed Legal Guardian? No data recorded  Name and Contact of Legal Guardian:  No data recorded If Minor and Not Living with Parent(s), Who has Custody? No data recorded Is CPS involved or ever been involved? Never  Is APS involved or ever been involved? Never  Patient Determined To Be At Risk for Harm To Self or Others Based on Review of Patient Reported Information or Presenting Complaint? No   Method: No data recorded  Availability of Means: No data recorded  Intent: No data recorded  Notification Required: No data recorded  Additional Information for Danger to Others Potential:  No data recorded  Additional Comments for Danger to Others Potential:  No data recorded  Are There Guns or Other Weapons in [place]?  No data recorded   Types of Guns/Weapons: No data recorded   Are These Weapons Safely Secured?                              No data recorded   Who Could Verify You Are Able To Have These Secured:    No data recorded Do You Have any Outstanding Charges, Pending Court Dates, Parole/Probation? No data recorded Contacted To Inform of Risk of Harm To Self or Others: Other: Comment (NA)  Location of Assessment: [facility] ED  Does Patient Present under Involuntary Commitment? Yes   IVC Papers Initial File Date: 06/21/20   [place] of Residence:  [place]  Patient Currently Receiving the Following Services: Not Receiving Services   Determination of Need: No data recorded  Options For Referral: Medication Management   Mamie Nick, LCAS

## 2020-06-23 ENCOUNTER — Ambulatory Visit (HOSPITAL_COMMUNITY): Payer: Self-pay | Admitting: Licensed Clinical Social Worker

## 2020-07-07 ENCOUNTER — Encounter (HOSPITAL_COMMUNITY): Payer: Self-pay | Admitting: Psychiatry

## 2020-07-07 ENCOUNTER — Other Ambulatory Visit: Payer: Self-pay

## 2020-07-07 ENCOUNTER — Ambulatory Visit (INDEPENDENT_AMBULATORY_CARE_PROVIDER_SITE_OTHER): Payer: No Payment, Other | Admitting: Psychiatry

## 2020-07-07 DIAGNOSIS — F319 Bipolar disorder, unspecified: Secondary | ICD-10-CM

## 2020-07-07 MED ORDER — LAMOTRIGINE 25 MG PO TABS: 50.0000 mg | ORAL_TABLET | Freq: Every day | ORAL | 2 refills | Status: DC

## 2020-07-07 MED ORDER — MIRTAZAPINE 15 MG PO TABS: 15.0000 mg | ORAL_TABLET | Freq: Every day | ORAL | 2 refills | Status: DC

## 2020-07-07 NOTE — Progress Notes (Signed)
BH MD/PA/NP OP Progress Note  07/07/2020 4:40 PM Thomas Hunter  MRN:  [id number]  Chief Complaint:    HPI: 34 year old male seen today for follow up psychiatric evaluation.   He has a history of anxiety, depression, alcohol-induced mood disorder, suicidal ideation/attempt, and bipolar disorder.  Patient was IVCd on  06/21/2020 at [facility]. Per note, patient was in an argument and was intoxicated.  He use a box cutter and a Research scientist (physical sciences) to cut both of his upper extremities multiple only. Today Patient reports that he is doing well on current medication regimen.  Today he denies symptoms of depressive and anxiety. He notes that Remeron has been effective in helping him sleep.  He informed Probation officer that he stopped taking Lamictal for 2 days because he thought he had Stevens-Johnson syndrome.  He notes that he later found out that he had poison oak and restarted taking the medication.  Since restarting Lamictal he reports that his mood has been stable.   Writer inquired about patient's IVC on 06/21/2020.  He notes that he became upset when his mother-in-law informed him that custody was temporarily being given to foster family for his son.  He notes at that time he became intoxicated and cut himself.  He notes that since that time he has stopped using marijuana and limited his alcohol consumption.  He notes that he drinks 1-2 alcoholic beverages a week.  He informed Probation officer that he passed his drug test today and is looking forward to improving his mental health.  He also informed Probation officer that he recently started a new job at [employer] and picked up other shifts at [employer] around the area.   He s agreeable to continue Lamictal 50 mg daily and Remeron 15 mg at bedtime.  No other concerns noted at this time.   Visit Diagnosis:    ICD-10-CM   1. Bipolar I disorder (HCC)  F31.9 lamoTRIgine (LAMICTAL) 25 MG tablet    mirtazapine (REMERON) 15 MG tablet    Past Psychiatric History: anxiety,  depression, alcohol-induced mood disorder, suicidal ideation/attempt, and bipolar disorder.  Past Medical History:  Past Medical History:  Diagnosis Date  . Anxiety   . Depression   . ETOH abuse   . GERD (gastroesophageal reflux disease)   . Gonorrhea 2016   History reviewed. No pertinent surgical history.  Family Psychiatric History: Unknown Family History:  Family History  Problem Relation Age of Onset  . Hypertension Other   . Diabetes Other     Social History:  Social History   Socioeconomic History  . Marital status: Single    Spouse name: Not on file  . Number of children: Not on file  . Years of education: Not on file  . Highest education level: Not on file  Occupational History  . Occupation: Chief Executive Officer: [employer]  Tobacco Use  . Smoking status: Current Every Day Smoker    Packs/day: 0.75    Types: Cigarettes  . Smokeless tobacco: Never Used  Vaping Use  . Vaping Use: Former  . Quit date: 04/22/2018  Substance and Sexual Activity  . Alcohol use: Yes    Comment: 1 pint per day   . Drug use: Yes    Types: Marijuana    Comment: crack  . Sexual activity: Not Currently    Partners: Female  Other Topics Concern  . Not on file  Social History Narrative   ** Merged History Encounter **  Social Determinants of Health   Financial Resource Strain: Low Risk   . Difficulty of Paying Living Expenses: Not hard at all  Food Insecurity: Food Insecurity Present  . Worried About Charity fundraiser in the Last Year: Sometimes true  . Ran Out of Food in the Last Year: Never true  Transportation Needs: No Transportation Needs  . Lack of Transportation (Medical): No  . Lack of Transportation (Non-Medical): No  Physical Activity: Sufficiently Active  . Days of Exercise per Week: 5 days  . Minutes of Exercise per Session: 60 min  Stress: Stress Concern Present  . Feeling of Stress : Rather much  Social Connections: Moderately Isolated  .  Frequency of Communication with Friends and Family: More than three times a week  . Frequency of Social Gatherings with Friends and Family: More than three times a week  . Attends Religious Services: Never  . Active Member of Clubs or Organizations: Yes  . Attends Archivist Meetings: 1 to 4 times per year  . Marital Status: Separated    Allergies: No Known Allergies  Metabolic Disorder Labs: No results found for: HGBA1C, MPG No results found for: PROLACTIN No results found for: CHOL, TRIG, HDL, CHOLHDL, VLDL, LDLCALC No results found for: TSH  Therapeutic Level Labs: No results found for: LITHIUM No results found for: VALPROATE No components found for:  CBMZ  Current Medications: Current Outpatient Medications  Medication Sig Dispense Refill  . HYDROcodone-acetaminophen (NORCO/VICODIN) 5-325 MG tablet Take 1 tablet by mouth 3 (three) times daily as needed for moderate pain. (Patient not taking: Reported on 04/02/2020) 21 tablet 0  . ibuprofen (ADVIL) 800 MG tablet Take 1 tablet (800 mg total) by mouth every 8 (eight) hours as needed. (Patient not taking: Reported on 06/21/2020) 21 tablet 0  . lamoTRIgine (LAMICTAL) 25 MG tablet Take 2 tablets (50 mg total) by mouth daily. 60 tablet 2  . lidocaine (LIDODERM) 5 % Place 1 patch onto the skin daily. Remove & Discard patch within 12 hours or as directed by MD (Patient not taking: Reported on 04/02/2020) 30 patch 0  . mirtazapine (REMERON) 15 MG tablet Take 1 tablet (15 mg total) by mouth at bedtime. 30 tablet 2  . nicotine (NICODERM CQ - DOSED IN MG/24 HOURS) 21 mg/24hr patch Place 1 patch (21 mg total) onto the skin daily. (Patient not taking: Reported on 04/02/2020) 28 patch 0  . penicillin v potassium (VEETID) 500 MG tablet Take 1 tablet (500 mg total) by mouth 4 (four) times daily. (Patient not taking: Reported on 04/02/2020) 28 tablet 0  . thiamine 100 MG tablet Take 1 tablet (100 mg total) by mouth daily. (Patient not taking:  Reported on 04/02/2020)    . traMADol (ULTRAM) 50 MG tablet Take 1 tablet (50 mg total) by mouth every 6 (six) hours as needed for severe pain. (Patient not taking: Reported on 04/02/2020) 15 tablet 0   No current facility-administered medications for this visit.     Musculoskeletal: Strength & Muscle Tone: within normal limits Gait & Station: normal Patient leans: N/A  Psychiatric Specialty Exam: Review of Systems  There were no vitals taken for this visit.There is no height or weight on file to calculate BMI.  General Appearance: Well Groomed  Eye Contact:  Good  Speech:  Clear and Coherent and Normal Rate  Volume:  Normal  Mood:  Euthymic  Affect:  Congruent  Thought Process:  Coherent, Goal Directed and Linear  Orientation:  Full (Time,  Place, and Person)  Thought Content: WDL and Logical   Suicidal Thoughts:  No  Homicidal Thoughts:  No  Memory:  Immediate;   Good Recent;   Good Remote;   Good  Judgement:  Good  Insight:  Good  Psychomotor Activity:  Normal  Concentration:  Concentration: Good and Attention Span: Good  Recall:  Good  Fund of Knowledge: Good  Language: Good  Akathisia:  No  Handed:  Right  AIMS (if indicated): Not done  Assets:  Communication Skills Desire for Improvement Financial Resources/Insurance Housing  ADL's:  Intact  Cognition: WNL  Sleep:  Good   Screenings: AUDIT     Counselor from 05/25/2020 in [facility] Admission (Discharged) from 07/02/2013 in [facility]  Alcohol Use Disorder Identification Test Final Score (AUDIT) 32 29    CAGE-AID     Counselor from 05/25/2020 in [facility]  CAGE-AID Score 4    GAD-7     Office Visit from 04/23/2019 in [facility]  Total GAD-7 Score 1    PHQ2-9     Counselor from 05/25/2020 in [facility] Office Visit from 04/23/2019 in [facility]  PHQ-2 Total Score 2 0  PHQ-9 Total Score 9 0       Assessment and Plan: Patient reports that he is doing well on current medication regimen.  He is agreeable to continue medications as prescribed.    1. Bipolar I disorder (HCC) Continue- lamoTRIgine (LAMICTAL) 25 MG tablet; Take 2 tablets (50 mg total) by mouth daily.  Dispense: 60 tablet; Refill: 2 Continue- mirtazapine (REMERON) 15 MG tablet; Take 1 tablet (15 mg total) by mouth at bedtime.  Dispense: 30 tablet; Refill: 2   Follow-up in 3 months   Salley Slaughter, NP 07/07/2020, 4:40 PM

## 2020-07-08 MED FILL — MIRTAZAPINE 15 MG TABLET: 15 | 30 days supply | Qty: 30 | Fill #0

## 2020-07-08 MED FILL — lamoTRIgine 25 MG TABS: 25 | 30 days supply | Qty: 60 | Fill #0

## 2020-07-15 ENCOUNTER — Ambulatory Visit (HOSPITAL_COMMUNITY): Payer: No Payment, Other | Admitting: Licensed Clinical Social Worker

## 2020-07-15 ENCOUNTER — Other Ambulatory Visit: Payer: Self-pay

## 2020-07-15 ENCOUNTER — Encounter (HOSPITAL_COMMUNITY): Payer: Self-pay

## 2020-07-23 ENCOUNTER — Other Ambulatory Visit: Payer: Self-pay

## 2020-07-23 ENCOUNTER — Ambulatory Visit (INDEPENDENT_AMBULATORY_CARE_PROVIDER_SITE_OTHER): Payer: No Payment, Other | Admitting: Licensed Clinical Social Worker

## 2020-07-23 DIAGNOSIS — F411 Generalized anxiety disorder: Secondary | ICD-10-CM

## 2020-07-23 DIAGNOSIS — F319 Bipolar disorder, unspecified: Secondary | ICD-10-CM

## 2020-07-23 DIAGNOSIS — F1010 Alcohol abuse, uncomplicated: Secondary | ICD-10-CM | POA: Diagnosis not present

## 2020-07-23 DIAGNOSIS — F10230 Alcohol dependence with withdrawal, uncomplicated: Secondary | ICD-10-CM | POA: Diagnosis not present

## 2020-07-23 NOTE — Progress Notes (Signed)
   THERAPIST PROGRESS NOTE  Session Time: 58   Therapist Response:   Subjective/Objective: Pt was alert and oriented x 5. He was casually dressed. With full range of affect/mood that was congruent with topics discussed. Thomas Hunter engaged well in session. He currently has financial and family stressors going on his life. Pt states that 4 days ago he lost his wallet with his ID card, Food stamp card, debit card, fishing license, and cash ($57). Thomas Hunter pays his bills every two weeks once he gets paid, so he does have all his bills caught up and ordered new cards that were lost with the wallet. Thomas Hunter has run out of his medications and does not have money until next week to get more. Pt reports that he last took his medication Monday, he will go 10+ days without medications. He did state that he will get them as soon as he gets money again. Currently he has temporary custody of his son as his mother is in the hospital with apparent drug overdose. Son is 13 years old and although the transition has been stressful pt has been making it work.  Although pt is not supposed to be drinking alcohol he does report he usually has 2 to 3 beers per day with some liquor throughout the month. Pt does state that he get tremors after day 3 of abstaining from alcohol    Assessment/Plan: Pt endorses symptom of tension and worry at time of the assessment. Pt has no current symptoms for depression of manic symptoms. He continues to meet criteria for GAD, Alcohol dependence with uncomplicated withdrawal & and bipolar 1. He will follow up with LCSW for next available appointment which is not until October 7th at 2pm. Pt to journal all symptoms as he is currently not taking any medication and he will re start medicine once his financial status next week changes.    Participation Level: Active  Behavioral Response: CasualAlertNA  Type of Therapy: Individual Therapy  Treatment Goals addressed: Anxiety  Interventions: CBT and  Supportive  Summary: Thomas Hunter is a 34 y.o. male who presents with GAD and bipolar 1.   Suicidal/Homicidal: Nowithout intent/plan   Plan: Return again in next Available .  Diagnosis: Axis I: Bipolar, Depressed and Generalized Anxiety Disorder    Dory Horn, LCSW 07/23/2020

## 2020-09-17 ENCOUNTER — Other Ambulatory Visit: Payer: Self-pay

## 2020-09-17 ENCOUNTER — Telehealth (HOSPITAL_COMMUNITY): Payer: Self-pay | Admitting: Licensed Clinical Social Worker

## 2020-09-17 ENCOUNTER — Ambulatory Visit (HOSPITAL_COMMUNITY): Payer: No Payment, Other | Admitting: Licensed Clinical Social Worker

## 2020-09-17 NOTE — Telephone Encounter (Signed)
LCSW sent two links then f/u with a PC. Pt did answer and stated that he got the dates mixed up. And asked to reschedule. Pt rescheduled for Nov 11th at 8am.

## 2020-10-02 IMAGING — DX DG CHEST 1V PORT
1 series · 1 of 1 positions shown · non-contrast
Comparison: 12/22/2018

CLINICAL DATA: Worsening cough for a couple of days. History of
smoking.

EXAM:
PORTABLE CHEST 1 VIEW

[chest]
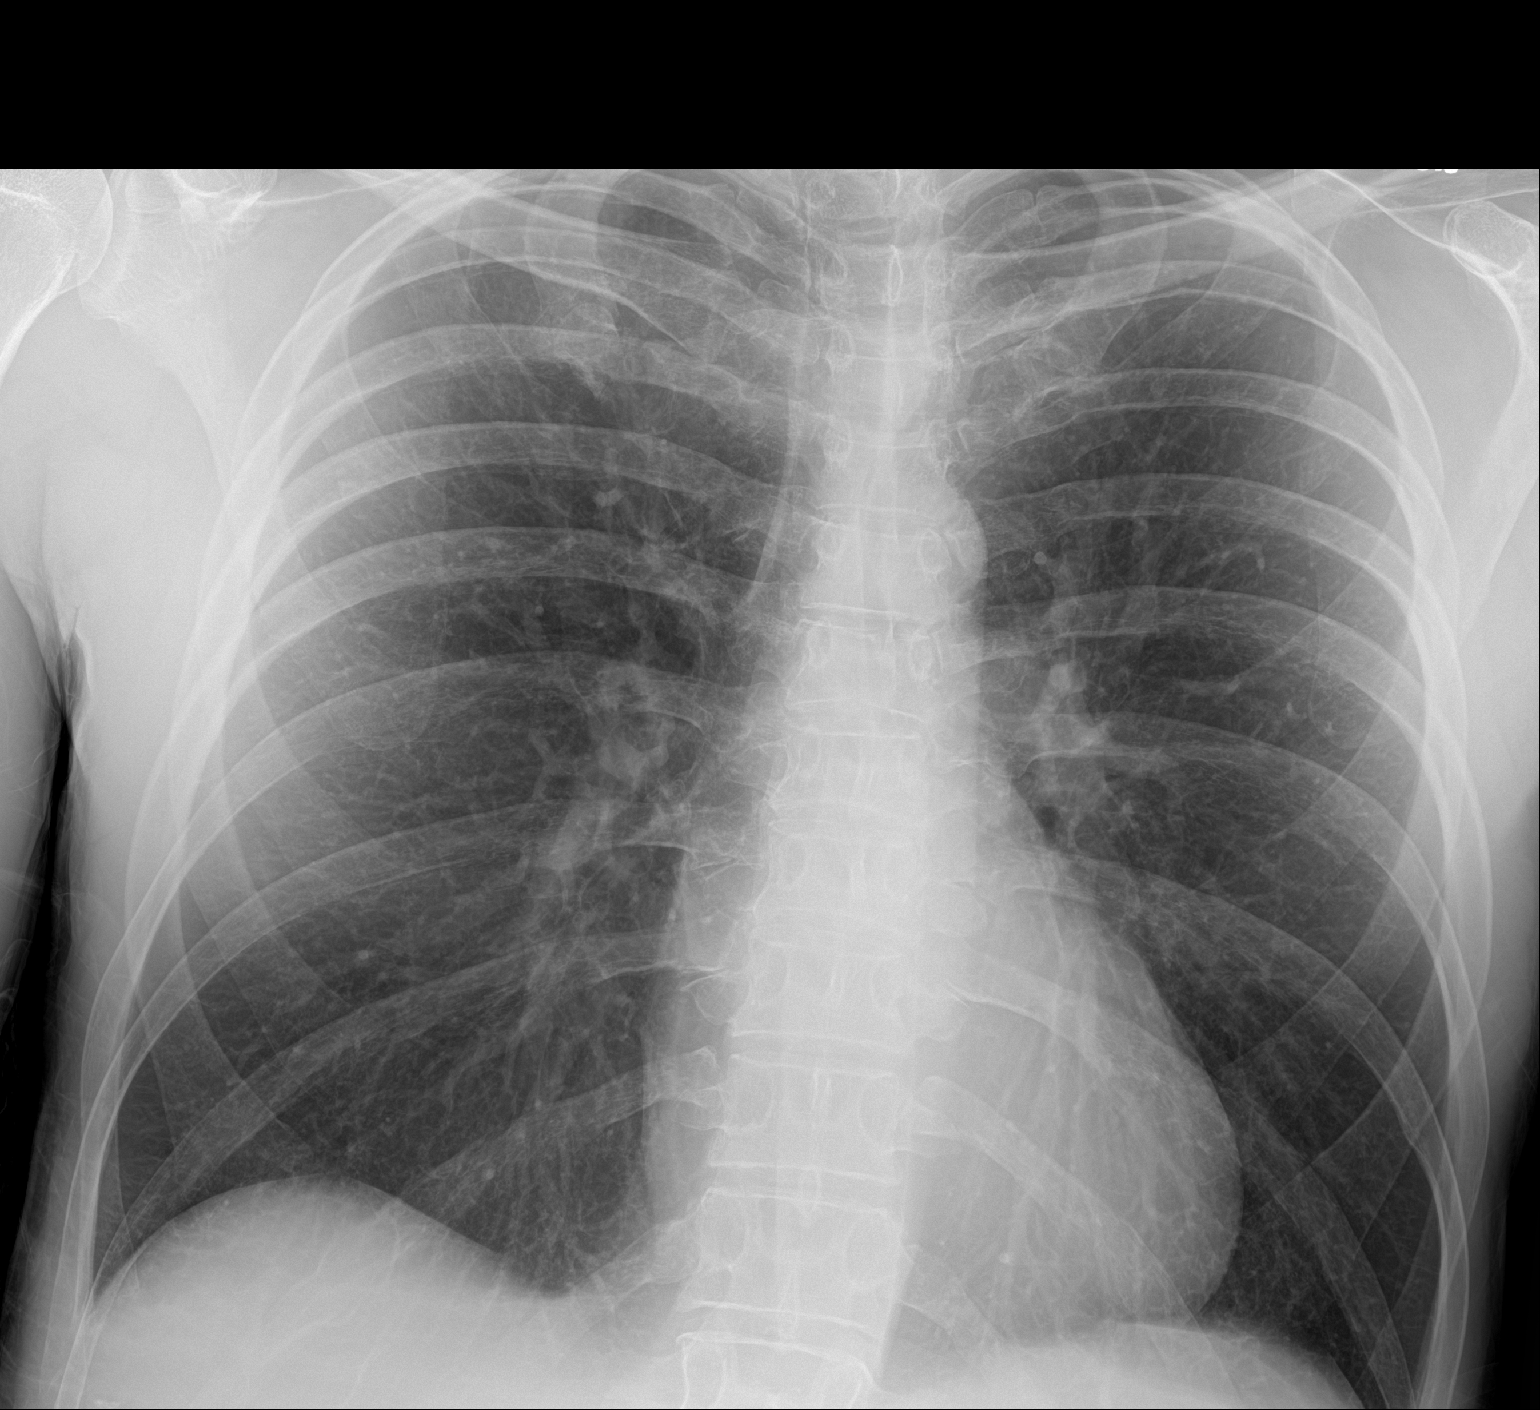

[1 of 1 positions shown; findings below may reference images not displayed]

FINDINGS: The cardiomediastinal silhouette is unchanged with normal heart
size. The lungs are well inflated and clear. No pleural effusion or
pneumothorax is identified. No acute osseous abnormality is seen.
IMPRESSION: No active disease.

## 2020-10-07 ENCOUNTER — Ambulatory Visit (INDEPENDENT_AMBULATORY_CARE_PROVIDER_SITE_OTHER): Payer: No Payment, Other | Admitting: Psychiatry

## 2020-10-07 ENCOUNTER — Other Ambulatory Visit (HOSPITAL_COMMUNITY): Payer: Self-pay | Admitting: Psychiatry

## 2020-10-07 ENCOUNTER — Encounter (HOSPITAL_COMMUNITY): Payer: Self-pay

## 2020-10-07 ENCOUNTER — Ambulatory Visit (HOSPITAL_COMMUNITY): Payer: Self-pay | Admitting: Licensed Clinical Social Worker

## 2020-10-07 ENCOUNTER — Encounter (HOSPITAL_COMMUNITY): Payer: Self-pay | Admitting: Psychiatry

## 2020-10-07 DIAGNOSIS — F319 Bipolar disorder, unspecified: Secondary | ICD-10-CM

## 2020-10-07 DIAGNOSIS — F411 Generalized anxiety disorder: Secondary | ICD-10-CM | POA: Diagnosis not present

## 2020-10-07 DIAGNOSIS — F17200 Nicotine dependence, unspecified, uncomplicated: Secondary | ICD-10-CM | POA: Diagnosis not present

## 2020-10-07 MED ORDER — MIRTAZAPINE 15 MG PO TABS: 15.0000 mg | ORAL_TABLET | Freq: Every day | ORAL | 2 refills | Status: DC

## 2020-10-07 MED ORDER — LAMOTRIGINE 25 MG PO TABS: 50.0000 mg | ORAL_TABLET | Freq: Every day | ORAL | 2 refills | Status: DC

## 2020-10-07 MED ORDER — HYDROXYZINE HCL 10 MG PO TABS: 10.0000 mg | ORAL_TABLET | Freq: Three times a day (TID) | ORAL | 2 refills | Status: DC | PRN

## 2020-10-07 MED ORDER — NICOTINE 21 MG/24HR TD PT24: 21.0000 mg | MEDICATED_PATCH | Freq: Every day | TRANSDERMAL | 2 refills | Status: DC

## 2020-10-07 NOTE — Progress Notes (Signed)
[facility] MD/PA/NP OP Progress Note Virtual Visit via Telephone Note  I connected with Thomas Hunter on 10/07/20 at  4:30 PM EDT by telephone and verified that I am speaking with the correct person using two identifiers.  Location: Patient: home Provider: Clinic   I discussed the limitations, risks, security and privacy concerns of performing an evaluation and management service by telephone and the availability of in person appointments. I also discussed with the patient that there may be a patient responsible charge related to this service. The patient expressed understanding and agreed to proceed.   I provided 45 minutes of non-face-to-face time during this encounter.    10/07/2020 2:36 PM Thomas Hunter  MRN:  [id number]  Chief Complaint: "I stopped take my medications"  HPI: 34 year old male seen today for follow up psychiatric evaluation.  He has a history of anxiety, depression, alcohol-induced mood disorder, suicidal ideation/attempt, and bipolar disorder.   Currently he is managed on Lamictal 50 mg daily and Remeron 15 mg nightly.  Today he reports that he has not taken his medications since his last visit.  Today patient is pleasant, cooperative, and engaged in conversation. He describes his mood as anxious and depressed.  He endorses symptoms of depression such as anhedonia, fatigue, hypersomnia, (noting he sleeps 10 to 12 hours), agitation, anxiety, poor concentration, and fluctuations in his appetite.  Patient notes that his anxiety has also been exacerbated lately because he has lost to close friends.  He also notes that 5 other people he knew recently died.  He also informed provider that he was kicked out of his [employer] class (a recovery substance use program).  Provider conducted a GAD-7 and patient scored a 15.  Provider also conducted a PHQ-9 and patient scored a 19.  He denies symptoms of depressive and anxiety.  He denies SI/HI/VH or paranoia.   Patient informed provider  that since he has been off his medications his mood fluctuates, he becomes irritable, distractible, and impulsively spends money on alcohol clothing, and not take it.  He denies SI/HI/VAH, paranoia, grandiosity, or delusions.  Patient notes that he would like to be restarted on the Remeron.  He notes that although he is sleeping over 10 hours a night he still does not feel rested and reports that it was effective in managing his sleep, anxiety, and depression.  He notes that since stopping his medication he has been having weird dreams and notes that he would like his mental health to improve.   Patient notes that to cope with his anxiety he has recently started drinking alcohol again.  He notes that he plans to follow back up with his support group to help him regain his sobriety.  He also notes that he has started smoking about a pack of cigarettes a day.  Patient is agreeable to restart Lamictal milligrams for 2 weeks and then increase to 50 mg to help stabilize mood.  He is also agreeable to restarting Remeron to help manage symptoms of anxiety and depression.  He will start hydroxyzine 10 mg 3 times daily as needed for anxiety.  Patient will also restart NicoDerm CQ patch daily for tobacco dependence.  He will follow-up with outpatient counseling for therapy.  No other concerns noted at this time.  Visit Diagnosis:    ICD-10-CM   1. Generalized anxiety disorder  F41.1 hydrOXYzine (ATARAX/VISTARIL) 10 MG tablet  2. Bipolar I disorder (HCC)  F31.9 lamoTRIgine (LAMICTAL) 25 MG tablet    mirtazapine (REMERON) 15  MG tablet  3. Tobacco dependence  F17.200 nicotine (NICODERM CQ - DOSED IN MG/24 HOURS) 21 mg/24hr patch    Past Psychiatric History: anxiety, depression, alcohol-induced mood disorder, suicidal ideation/attempt, and bipolar disorder.   Past Medical History:  Past Medical History:  Diagnosis Date  . Anxiety   . Depression   . ETOH abuse   . GERD (gastroesophageal reflux disease)    . Gonorrhea 2016   History reviewed. No pertinent surgical history.  Family Psychiatric History: Unknown  Family History:  Family History  Problem Relation Age of Onset  . Hypertension Other   . Diabetes Other     Social History:  Social History   Socioeconomic History  . Marital status: Single    Spouse name: Not on file  . Number of children: Not on file  . Years of education: Not on file  . Highest education level: Not on file  Occupational History  . Occupation: Chief Executive Officer: [employer]  Tobacco Use  . Smoking status: Current Every Day Smoker    Packs/day: 0.75    Types: Cigarettes  . Smokeless tobacco: Never Used  Vaping Use  . Vaping Use: Former  . Quit date: 04/22/2018  Substance and Sexual Activity  . Alcohol use: Yes    Comment: 1 pint per day   . Drug use: Yes    Types: Marijuana    Comment: crack  . Sexual activity: Not Currently    Partners: Female  Other Topics Concern  . Not on file  Social History Narrative   ** Merged History Encounter **       Social Determinants of Health   Financial Resource Strain: Low Risk   . Difficulty of Paying Living Expenses: Not hard at all  Food Insecurity: Food Insecurity Present  . Worried About Charity fundraiser in the Last Year: Sometimes true  . Ran Out of Food in the Last Year: Never true  Transportation Needs: No Transportation Needs  . Lack of Transportation (Medical): No  . Lack of Transportation (Non-Medical): No  Physical Activity: Sufficiently Active  . Days of Exercise per Week: 5 days  . Minutes of Exercise per Session: 60 min  Stress: Stress Concern Present  . Feeling of Stress : Rather much  Social Connections: Moderately Isolated  . Frequency of Communication with Friends and Family: More than three times a week  . Frequency of Social Gatherings with Friends and Family: More than three times a week  . Attends Religious Services: Never  . Active Member of Clubs or  Organizations: Yes  . Attends Archivist Meetings: 1 to 4 times per year  . Marital Status: Separated    Allergies: No Known Allergies  Metabolic Disorder Labs: No results found for: HGBA1C, MPG No results found for: PROLACTIN No results found for: CHOL, TRIG, HDL, CHOLHDL, VLDL, LDLCALC No results found for: TSH  Therapeutic Level Labs: No results found for: LITHIUM No results found for: VALPROATE No components found for:  CBMZ  Current Medications: Current Outpatient Medications  Medication Sig Dispense Refill  . HYDROcodone-acetaminophen (NORCO/VICODIN) 5-325 MG tablet Take 1 tablet by mouth 3 (three) times daily as needed for moderate pain. (Patient not taking: Reported on 04/02/2020) 21 tablet 0  . hydrOXYzine (ATARAX/VISTARIL) 10 MG tablet Take 1 tablet (10 mg total) by mouth 3 (three) times daily as needed. 90 tablet 2  . ibuprofen (ADVIL) 800 MG tablet Take 1 tablet (800 mg total) by mouth every 8 (  eight) hours as needed. (Patient not taking: Reported on 06/21/2020) 21 tablet 0  . lamoTRIgine (LAMICTAL) 25 MG tablet Take 2 tablets (50 mg total) by mouth daily. 50 tablet 2  . lidocaine (LIDODERM) 5 % Place 1 patch onto the skin daily. Remove & Discard patch within 12 hours or as directed by MD (Patient not taking: Reported on 04/02/2020) 30 patch 0  . mirtazapine (REMERON) 15 MG tablet Take 1 tablet (15 mg total) by mouth at bedtime. 30 tablet 2  . nicotine (NICODERM CQ - DOSED IN MG/24 HOURS) 21 mg/24hr patch Place 1 patch (21 mg total) onto the skin daily. 28 patch 2  . penicillin v potassium (VEETID) 500 MG tablet Take 1 tablet (500 mg total) by mouth 4 (four) times daily. (Patient not taking: Reported on 04/02/2020) 28 tablet 0  . thiamine 100 MG tablet Take 1 tablet (100 mg total) by mouth daily. (Patient not taking: Reported on 04/02/2020)    . traMADol (ULTRAM) 50 MG tablet Take 1 tablet (50 mg total) by mouth every 6 (six) hours as needed for severe pain. (Patient  not taking: Reported on 04/02/2020) 15 tablet 0   No current facility-administered medications for this visit.     Musculoskeletal: Strength & Muscle Tone: Unable to assess due to telephone visit Gait & Station: Unable to assess due to telephone visit Patient leans: N/A  Psychiatric Specialty Exam: Review of Systems  There were no vitals taken for this visit.There is no height or weight on file to calculate BMI.  General Appearance: Unable to assess due to telephone visit  Eye Contact:  Unable to assess due to telephone visit  Speech:  Clear and Coherent and Normal Rate  Volume:  Normal  Mood:  Anxious, Depressed and Irritable  Affect:  Appropriate and Congruent  Thought Process:  Coherent, Goal Directed and Linear  Orientation:  Full (Time, Place, and Person)  Thought Content: WDL and Logical   Suicidal Thoughts:  No  Homicidal Thoughts:  No  Memory:  Immediate;   Good Recent;   Good Remote;   Good  Judgement:  Fair  Insight:  Good  Psychomotor Activity:  Normal  Concentration:  Concentration: Fair and Attention Span: Fair  Recall:  Good  Fund of Knowledge: Good  Language: Good  Akathisia:  No  Handed:  Right  AIMS (if indicated): Not done  Assets:  Communication Skills Desire for Improvement Financial Resources/Insurance Housing Social Support  ADL's:  Intact  Cognition: WNL  Sleep:  Fair   Screenings: AUDIT     Counselor from 05/25/2020 in [facility] Admission (Discharged) from 07/02/2013 in [facility]  Alcohol Use Disorder Identification Test Final Score (AUDIT) 32 29    CAGE-AID     Counselor from 05/25/2020 in [place] Score 4    GAD-7     Clinical Support from 10/07/2020 in [facility] Office Visit from 04/23/2019 in [place]  Total GAD-7 Score 15 1    PHQ2-9     Clinical Support from  10/07/2020 in [facility] Counselor from 05/25/2020 in [facility] Office Visit from 04/23/2019 in [place]  PHQ-2 Total Score 4 2 0  PHQ-9 Total Score 19 9 0       Assessment and Plan: Patient endorses symptoms of anxiety, depression, and hypomania.  He is agreeable  to restarting Lamictal 25 mg for 2 weeks and then increasing to 50 mg to help stabilize mood.  He is also agreeable to restarting Remeron 15 mg nightly to help manage symptoms of anxiety and depression.  He will also start hydroxyzine 10 mg 3 times daily as needed for anxiety.  He will also restart NicoDerm CQ daily.  1. Bipolar I disorder (HCC)  Restart- lamoTRIgine (LAMICTAL) 25 MG tablet; Take 2 tablets (50 mg total) by mouth daily.  Dispense: 50 tablet; Refill: 2 Restart- mirtazapine (REMERON) 15 MG tablet; Take 1 tablet (15 mg total) by mouth at bedtime.  Dispense: 30 tablet; Refill: 2  2. Generalized anxiety disorder  Start- hydrOXYzine (ATARAX/VISTARIL) 10 MG tablet; Take 1 tablet (10 mg total) by mouth 3 (three) times daily as needed.  Dispense: 90 tablet; Refill: 2  3. Tobacco dependence  Restart- nicotine (NICODERM CQ - DOSED IN MG/24 HOURS) 21 mg/24hr patch; Place 1 patch (21 mg total) onto the skin daily.  Dispense: 28 patch; Refill: 2  Follow-up in 1 month Follow-up with therapy  Salley Slaughter, NP 10/07/2020, 2:36 PM

## 2020-10-09 MED FILL — NICOTINE 21 MG/24HR PATCH: 21 | 28 days supply | Qty: 28 | Fill #0

## 2020-10-09 MED FILL — MIRTAZAPINE 15 MG TABLET: 15 | 30 days supply | Qty: 30 | Fill #0

## 2020-10-09 MED FILL — lamoTRIgine 25 MG TABS: 25 | 25 days supply | Qty: 50 | Fill #0

## 2020-10-09 MED FILL — hydrOXYzine HCL 10 MG TABS: 10 | 30 days supply | Qty: 90 | Fill #0

## 2020-10-12 ENCOUNTER — Other Ambulatory Visit (HOSPITAL_COMMUNITY): Payer: Self-pay | Admitting: Psychiatry

## 2020-10-12 ENCOUNTER — Telehealth (HOSPITAL_COMMUNITY): Payer: Self-pay | Admitting: *Deleted

## 2020-10-12 DIAGNOSIS — F319 Bipolar disorder, unspecified: Secondary | ICD-10-CM

## 2020-10-12 DIAGNOSIS — F411 Generalized anxiety disorder: Secondary | ICD-10-CM

## 2020-10-12 DIAGNOSIS — F17200 Nicotine dependence, unspecified, uncomplicated: Secondary | ICD-10-CM

## 2020-10-12 MED ORDER — LAMOTRIGINE 25 MG PO TABS: 50.0000 mg | ORAL_TABLET | Freq: Every day | ORAL | 2 refills | Status: DC

## 2020-10-12 MED ORDER — HYDROXYZINE HCL 10 MG PO TABS: 10.0000 mg | ORAL_TABLET | Freq: Three times a day (TID) | ORAL | 2 refills | Status: DC | PRN

## 2020-10-12 MED ORDER — MIRTAZAPINE 15 MG PO TABS: 15.0000 mg | ORAL_TABLET | Freq: Every day | ORAL | 2 refills | Status: DC

## 2020-10-12 MED ORDER — NICOTINE 21 MG/24HR TD PT24: 21.0000 mg | MEDICATED_PATCH | Freq: Every day | TRANSDERMAL | 2 refills | Status: DC

## 2020-10-12 NOTE — Telephone Encounter (Signed)
VM left for writer stating his medicines have not been moved to [place]

## 2020-10-22 ENCOUNTER — Ambulatory Visit (HOSPITAL_COMMUNITY): Payer: No Payment, Other | Admitting: Licensed Clinical Social Worker

## 2020-10-24 ENCOUNTER — Emergency Department (HOSPITAL_COMMUNITY)
Admission: EM | Admit: 2020-10-24 | Discharge: 2020-10-26 | Disposition: A | Discharge status: Home / Self Care | Payer: Medicaid Other | Attending: Emergency Medicine | Admitting: Emergency Medicine

## 2020-10-24 ENCOUNTER — Encounter (HOSPITAL_COMMUNITY): Payer: Self-pay | Admitting: Emergency Medicine

## 2020-10-24 DIAGNOSIS — F1010 Alcohol abuse, uncomplicated: Secondary | ICD-10-CM | POA: Insufficient documentation

## 2020-10-24 DIAGNOSIS — R451 Restlessness and agitation: Secondary | ICD-10-CM | POA: Insufficient documentation

## 2020-10-24 DIAGNOSIS — F17210 Nicotine dependence, cigarettes, uncomplicated: Secondary | ICD-10-CM | POA: Insufficient documentation

## 2020-10-24 DIAGNOSIS — F319 Bipolar disorder, unspecified: Secondary | ICD-10-CM | POA: Insufficient documentation

## 2020-10-24 DIAGNOSIS — F1490 Cocaine use, unspecified, uncomplicated: Secondary | ICD-10-CM | POA: Insufficient documentation

## 2020-10-24 DIAGNOSIS — Z79899 Other long term (current) drug therapy: Secondary | ICD-10-CM | POA: Insufficient documentation

## 2020-10-24 LAB — COMPREHENSIVE METABOLIC PANEL
ALT: 35 U/L (ref 0–44)
AST: 34 U/L (ref 15–41)
Albumin: 4.9 g/dL (ref 3.5–5.0)
Alkaline Phosphatase: 46 U/L (ref 38–126)
Anion gap: 12 (ref 5–15)
BUN: 7 mg/dL (ref 6–20)
CO2: 23 mmol/L (ref 22–32)
Calcium: 9.2 mg/dL (ref 8.9–10.3)
Chloride: 107 mmol/L (ref 98–111)
Creatinine, Ser: 0.87 mg/dL (ref 0.61–1.24)
GFR, Estimated: >60 mL/min (ref >60)
Glucose, Bld: 116 mg/dL — ABNORMAL HIGH (ref 70–99)
Potassium: 4.1 mmol/L (ref 3.5–5.1)
Sodium: 142 mmol/L (ref 135–145)
Total Bilirubin: 0.5 mg/dL (ref 0.3–1.2)
Total Protein: 8 g/dL (ref 6.5–8.1)

## 2020-10-24 LAB — CBC
HCT: 51.9 % (ref 39.0–52.0)
Hemoglobin: 17.6 g/dL — ABNORMAL HIGH (ref 13.0–17.0)
MCH: 33.7 pg (ref 26.0–34.0)
MCHC: 33.9 g/dL (ref 30.0–36.0)
MCV: 99.4 fL (ref 80.0–100.0)
Platelets: 330 10*3/uL (ref 150–400)
RBC: 5.22 MIL/uL (ref 4.22–5.81)
RDW: 12.6 % (ref 11.5–15.5)
WBC: 10.3 10*3/uL (ref 4.0–10.5)
nRBC: 0 % (ref 0.0–0.2)

## 2020-10-24 LAB — RAPID URINE DRUG SCREEN, HOSP PERFORMED
Amphetamines: NOT DETECTED
Barbiturates: NOT DETECTED
Benzodiazepines: NOT DETECTED
Cocaine: POSITIVE — AB
Opiates: NOT DETECTED
Tetrahydrocannabinol: POSITIVE — AB

## 2020-10-24 LAB — SALICYLATE LEVEL: Salicylate Lvl: <7 mg/dL — ABNORMAL LOW (ref 7.0–30.0)

## 2020-10-24 LAB — ETHANOL: Alcohol, Ethyl (B): 261 mg/dL — ABNORMAL HIGH (ref <10)

## 2020-10-24 LAB — ACETAMINOPHEN LEVEL: Acetaminophen (Tylenol), Serum: <10 ug/mL — ABNORMAL LOW (ref 10–30)

## 2020-10-24 MED ORDER — DIPHENHYDRAMINE HCL 50 MG/ML IJ SOLN
50.0000 mg | Freq: Once | INTRAMUSCULAR | Status: AC
  Administered 2020-10-24: 50 mg via INTRAMUSCULAR
  Filled 2020-10-24: qty 1

## 2020-10-24 MED ORDER — LORAZEPAM 1 MG PO TABS
0.0000 mg | ORAL_TABLET | Freq: Four times a day (QID) | ORAL | Status: DC
  Administered 2020-10-25 (×3): 1 mg via ORAL
  Filled 2020-10-24 (×3): qty 1

## 2020-10-24 MED ORDER — THIAMINE HCL 100 MG PO TABS
100.0000 mg | ORAL_TABLET | Freq: Every day | ORAL | Status: DC
  Administered 2020-10-25 – 2020-10-26 (×2): 100 mg via ORAL
  Filled 2020-10-24 (×2): qty 1

## 2020-10-24 MED ORDER — HALOPERIDOL LACTATE 5 MG/ML IJ SOLN
5.0000 mg | Freq: Once | INTRAMUSCULAR | Status: AC
  Administered 2020-10-24: 5 mg via INTRAMUSCULAR
  Filled 2020-10-24: qty 1

## 2020-10-24 MED ORDER — FOLIC ACID 1 MG PO TABS
1.0000 mg | ORAL_TABLET | Freq: Every day | ORAL | Status: DC
  Administered 2020-10-25 – 2020-10-26 (×2): 1 mg via ORAL
  Filled 2020-10-24 (×2): qty 1

## 2020-10-24 MED ORDER — LORAZEPAM 2 MG/ML IJ SOLN
2.0000 mg | Freq: Once | INTRAMUSCULAR | Status: AC
  Administered 2020-10-24: 2 mg via INTRAMUSCULAR
  Filled 2020-10-24: qty 1

## 2020-10-24 MED ORDER — THIAMINE HCL 100 MG/ML IJ SOLN: 100.0000 mg | Freq: Every day | INTRAMUSCULAR | Status: DC

## 2020-10-24 MED ORDER — LORAZEPAM 1 MG PO TABS: 0.0000 mg | ORAL_TABLET | Freq: Two times a day (BID) | ORAL | Status: DC

## 2020-10-24 MED ORDER — LORAZEPAM 2 MG/ML IJ SOLN: 1.0000 mg | INTRAMUSCULAR | Status: DC | PRN

## 2020-10-24 MED ORDER — LORAZEPAM 1 MG PO TABS: 1.0000 mg | ORAL_TABLET | ORAL | Status: DC | PRN

## 2020-10-24 MED ORDER — ADULT MULTIVITAMIN W/MINERALS CH
1.0000 | ORAL_TABLET | Freq: Every day | ORAL | Status: DC
  Administered 2020-10-25 – 2020-10-26 (×2): 1 via ORAL
  Filled 2020-10-24 (×2): qty 1

## 2020-10-24 NOTE — ED Provider Notes (Signed)
[facility] EMERGENCY DEPARTMENT Provider Note   CSN: [id number] Arrival date & time: 10/24/20  2052     History Chief Complaint  Patient presents with  . IVC / SI , Agressive    Thomas Hunter is a 34 y.o. male hx of alcohol abuse, alcohol induced mood disorder, bipolar here presenting with agitation.  Patient is currently under IVC.  Per the IVC paperwork patient has been drinking alcohol.  Patient also has been having suicidal ideation and told family that he wants to burn himself up.  Patient is very aggressive towards staff and just wants to leave.  He states that he does not know why his family always does that when he drinks alcohol.  He does admit to drinking alcohol.  Upon review of records, patient has not been taking his psychiatric medicine.  He had previous psych admissions.   The history is provided by the patient.       Past Medical History:  Diagnosis Date  . Anxiety   . Depression   . ETOH abuse   . GERD (gastroesophageal reflux disease)   . Gonorrhea 2016    Patient Active Problem List   Diagnosis Date Noted  . Bipolar I disorder ([place]) 07/07/2020  . Maxillary sinus fracture, closed, initial encounter ([place]) 06/04/2020  . Closed fracture of medial portion of right tibial plateau with routine healing 07/03/2019  . Open fracture of right proximal tibia 04/24/2019  . Encounter to establish care 04/24/2019  . Alcohol-induced mood disorder ([place]) 03/06/2016  . Alcohol dependence with uncomplicated withdrawal ([place]) 03/06/2016  . Alcohol abuse 09/26/2015  . Generalized anxiety disorder 07/04/2013  . 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 Self-mutilation 07/04/2013    History reviewed. No pertinent surgical history.     Family History  Problem Relation Age of Onset  . Hypertension Other   . Diabetes Other     Social History   Tobacco Use  . Smoking status: Current Every Day Smoker    Packs/day: 0.75    Types: Cigarettes  .  Smokeless tobacco: Never Used  Vaping Use  . Vaping Use: Former  . Quit date: 04/22/2018  Substance Use Topics  . Alcohol use: Yes    Comment: 1 pint per day   . Drug use: Yes    Types: Marijuana    Comment: crack    Home Medications Prior to Admission medications   Medication Sig Start Date End Date Taking? Authorizing Provider  HYDROcodone-acetaminophen (NORCO/VICODIN) 5-325 MG tablet Take 1 tablet by mouth 3 (three) times daily as needed for moderate pain. Patient not taking: Reported on 04/02/2020 07/03/19   Suzan Slick, NP  hydrOXYzine (ATARAX/VISTARIL) 10 MG tablet Take 1 tablet (10 mg total) by mouth 3 (three) times daily as needed. 10/12/20   Salley Slaughter, NP  ibuprofen (ADVIL) 800 MG tablet Take 1 tablet (800 mg total) by mouth every 8 (eight) hours as needed. Patient not taking: Reported on 06/21/2020 04/23/20   Darr, Edison Nasuti, PA-C  lamoTRIgine (LAMICTAL) 25 MG tablet Take 2 tablets (50 mg total) by mouth daily. 10/12/20   Eulis Canner E, NP  lidocaine (LIDODERM) 5 % Place 1 patch onto the skin daily. Remove & Discard patch within 12 hours or as directed by MD Patient not taking: Reported on 04/02/2020 12/08/18   Nuala Alpha A, PA-C  mirtazapine (REMERON) 15 MG tablet Take 1 tablet (15 mg total) by mouth at bedtime. 10/12/20   Salley Slaughter, NP  nicotine (  NICODERM CQ - DOSED IN MG/24 HOURS) 21 mg/24hr patch Place 1 patch (21 mg total) onto the skin daily. 10/12/20   Salley Slaughter, NP  penicillin v potassium (VEETID) 500 MG tablet Take 1 tablet (500 mg total) by mouth 4 (four) times daily. Patient not taking: Reported on 04/02/2020 11/27/19   Dalia Heading, PA-C  thiamine 100 MG tablet Take 1 tablet (100 mg total) by mouth daily. Patient not taking: Reported on 04/02/2020 03/07/16   Niel Hummer, NP  traMADol (ULTRAM) 50 MG tablet Take 1 tablet (50 mg total) by mouth every 6 (six) hours as needed for severe pain. Patient not taking: Reported on  04/02/2020 11/27/19   Dalia Heading, PA-C    Allergies    Patient has no known allergies.  Review of Systems   Review of Systems  Psychiatric/Behavioral: Positive for agitation.  All other systems reviewed and are negative.   Physical Exam Updated Vital Signs BP (!) 143/102 (BP Location: Right Arm)   Pulse (!) 108   Temp 97.8 F (36.6 C) (Oral)   Resp 18   SpO2 100%   Physical Exam Vitals and nursing note reviewed.  Constitutional:      Comments: Agitated   HENT:     Head: Normocephalic.     Mouth/Throat:     Mouth: Mucous membranes are dry.  Eyes:     Pupils: Pupils are equal, round, and reactive to light.  Cardiovascular:     Pulses: Normal pulses.  Pulmonary:     Effort: Pulmonary effort is normal.  Abdominal:     General: Abdomen is flat.  Musculoskeletal:        General: Normal range of motion.     Cervical back: Normal range of motion.  Skin:    General: Skin is warm.     Capillary Refill: Capillary refill takes less than 2 seconds.  Neurological:     General: No focal deficit present.  Psychiatric:     Comments: Agitated, poor judgment      ED Results / Procedures / Treatments   Labs (all labs ordered are listed, but only abnormal results are displayed) Labs Reviewed  COMPREHENSIVE METABOLIC PANEL  ETHANOL  SALICYLATE LEVEL  ACETAMINOPHEN LEVEL  CBC  RAPID URINE DRUG SCREEN, HOSP PERFORMED    EKG None  Radiology No results found.  Procedures Procedures (including critical care time)  Medications Ordered in ED Medications  LORazepam (ATIVAN) injection 2 mg (2 mg Intramuscular Given 10/24/20 2132)  haloperidol lactate (HALDOL) injection 5 mg (5 mg Intramuscular Given 10/24/20 2132)  diphenhydrAMINE (BENADRYL) injection 50 mg (50 mg Intramuscular Given 10/24/20 2132)    ED Course  I have reviewed the triage vital signs and the nursing notes.  Pertinent labs & imaging results that were available during my care of the patient  were reviewed by me and considered in my medical decision making (see chart for details).    MDM Rules/Calculators/A&P                         Thomas Hunter is a 34 y.o. male here with agitation.  Patient appears intoxicated.  Patient is very agitated towards staff.  Patient is under IVC and I fill out the first exam.  Patient will need Ativan Haldol for agitation.  Will consult TTS.  11:46 PM EtOH is 261.  Started on CIWA.  Cocaine is positive.  Will consult TTS.    Final Clinical Impression(s) /  ED Diagnoses Final diagnoses:  None    Rx / DC Orders ED Discharge Orders    None       Drenda Freeze, MD 10/24/20 [phone]

## 2020-10-24 NOTE — ED Notes (Signed)
Psych packet completed and placed in chart

## 2020-10-24 NOTE — ED Notes (Signed)
Pt cursing and yelling at this RN, Police at bedside.

## 2020-10-24 NOTE — ED Triage Notes (Signed)
Patient arrived with 2 [employer] officers with IVC papers for SI/Agressive Behavior , intoxicated with ETOH , verbally aggressive with staff and non compliant at triage .

## 2020-10-25 DIAGNOSIS — F1994 Other psychoactive substance use, unspecified with psychoactive substance-induced mood disorder: Secondary | ICD-10-CM | POA: Insufficient documentation

## 2020-10-25 MED ORDER — LAMOTRIGINE 25 MG PO TABS
25.0000 mg | ORAL_TABLET | Freq: Every day | ORAL | Status: DC
  Administered 2020-10-26: 25 mg via ORAL
  Filled 2020-10-25: qty 1

## 2020-10-25 MED ORDER — MIRTAZAPINE 15 MG PO TBDP
15.0000 mg | ORAL_TABLET | Freq: Every day | ORAL | Status: DC
  Administered 2020-10-25: 15 mg via ORAL
  Filled 2020-10-25 (×3): qty 1

## 2020-10-25 MED ORDER — NICOTINE 21 MG/24HR TD PT24
21.0000 mg | MEDICATED_PATCH | Freq: Every day | TRANSDERMAL | Status: DC
  Administered 2020-10-25 – 2020-10-26 (×2): 21 mg via TRANSDERMAL
  Filled 2020-10-25 (×2): qty 1

## 2020-10-25 NOTE — Consult Note (Signed)
Thomas Hunter is a 34 year old male presenting to [facility] under IVC by his mother for SI and aggressive behaviors while intoxicated. Patient has past psychiatric history of Bipolar I disorder, ETOH abuse, Cocaine use. Per last virtual visit 10/07/20 note patient had not been taking medications.   Per [facility] note 10/25/2020 1027am: "Patient reports drinking with some friends last night. Patient reports sharing a pint of fireball with one friend and drinking a couple mini bottles of liquor. Patient states after his friend left his house he went to his neighbor's house and shortly after being at his next-door neighbor's house the police came and suggested that he go to hospital to get checked out. Patient states he was hesitant at first but then reports that he was willing to go. Patient reports when he arrived at hospital, he was notified that he was IVC'd and became combative because he was lied to and had no idea why he was at the hospital in the first place. Patient denies making any SI/HI statements, he denies any aggression and reports that he was "teary drunk and emotional" last night. Clinician assessed for stressors and patient reports "I can't see my kids, I'm on probation, I work three jobs. I'm just trying to stay on my feet." Patient states that he does not know who IVC'd him or why. Upon further exploration, patient states that he could have called his mother when he was drunk, but patient denies making SI statements. Patient is on supervised probation and his next probation appointment is 11/11/20. Patient also attends [facility] with next appointment on 11/25/20."  Plan:   -restart home medications  -continue observation for stabilization to re-evaluate in the morning  -Consult with peer support   -Consult with case management for additional outpatient supports to  address pt needs  Patient home medications restarted:   -Remeron 15 HS  -Nicoderm CQ patch  -Lamictal 25mg  (10/26/2020 start)  Patient  is currently denying any active suicidal or homicidal ideations, auditory/visual hallucinations, and does not currently appear to be responding to any internal stimuli during assessment with TTS.   Based on this evaluation, I am recommending overnight observation in ED for stabilization with evaluation tomorrow.

## 2020-10-25 NOTE — BHH Counselor (Signed)
Lazaro Arms, RN, notified through secure chat that per Oneida Alar, NP, recommends overnight observation at ED with psychiatric evaluation tomorrow.

## 2020-10-25 NOTE — BH Assessment (Signed)
Pt currently too somnolent to participate in TTS assessment.   Evelena Peat, [facility], [facility] Triage Specialist [phone]

## 2020-10-25 NOTE — ED Notes (Signed)
Pt calm and cooperative, sleeping in stretcher, Breathing nonlabored with symmetrical chest rise, sitter precautions remain in place.

## 2020-10-25 NOTE — ED Notes (Signed)
Patient given a cola.

## 2020-10-25 NOTE — BH Assessment (Signed)
Per Oneida Alar, NP, recommends overnight observation at ED with psychiatric evaluation tomorrow.

## 2020-10-25 NOTE — BH Assessment (Signed)
Comprehensive Clinical Assessment (CCA) Note  10/25/2020 Thomas Hunter [id number]  Thomas Hunter is a 34 year old male presenting to [facility] under IVC for SI and aggressive behaviors while intoxicated. Patient reports drinking with some friends last night. Patient reports sharing a pint of fireball with one friend and drinking a couple mini bottles of liquor. Patient states after his friend left his house he went to his neighbor's house and shortly after being at his next-door neighbor's house the police came and suggested that he go to hospital to get checked out. Patient states he was hesitant at first but then reports that he was willing to go. Patient reports when he arrived at hospital, he was notified that he was IVC'd and became combative because he was lied to and had no idea why he was at the hospital in the first place. Patient denies making any SI/HI statements, he denies any aggression and reports that he was "teary drunk and emotional" last night. Clinician assessed for stressors and patient reports "I can't see my kids, I'm on probation, I work three jobs. I'm just trying to stay on my feet." Patient states that he does not know who IVC'd him or why. Upon further exploration, patient states that he could have called his mother when he was drunk, but patient denies making SI statements. Patient is on supervised probation and his next probation appointment is 11/11/20. Patient also attends [facility] with next appointment on 11/25/20.      Per IVC "respondent suffers from depression, bipolar, anxiety and alcoholism and takes his medications. Was last committed in [place] or June. Says he wants to kill himself by burning himself. He is very confused about what other are saying or doing. Is very aggressive by hitting tables, punching the hardwood floor and busting his cell phone. Respondent self mutilates and drinks everyday".  Patient reports receiving therapy and medication management at [facility]. Patient  states his last appointment with therapist was a month ago and he had telehealth appointment with psychiatrist a couple weeks ago. Patient reports compliance with his medications. Patient provides consent for TTS to contact his mother Thomas Hunter for collateral.     Mom reports that she received a call from patient neighbor reporting that patient was displaying aggressive behaviors, falling flat on his face, blacking out and then waking up with same aggressive behaviors. Mom states that neighbor witness patient burn himself with a cigarette and then patient made statements that he was going to go home and slit his wrist. Mom states that she called police and was advised to file IVC paperwork by police due to patient aggressive behaviors. Mom reports patient behaviors starting at the age of 11 after his grandfather passed away. Mom reports patient was cutting inside his thigh, aggression and drinking since age of 89. Mom states that patient has had multiple arrest and accidents due to alcohol use. Mom states that patient is a felon on supervised probation and has failed multiple drug test. Mom states that patient received rehab services in [place] and lived in an [place] before. Mom states that patient is less aggressive when he is not intoxicated. Mom states that patient roommate is a 35 year old male, and he is "terrified" of patient behaviors. Mom states that patient could be at risk of being homeless due to alcohol use. Mom states that she cannot say if patient will be safe if discharge due to him possibly being verbally aggressive towards neighbors and roommate.     Patient is oriented  x4, engaged, alert and cooperative. Patient eye contact and tone of voice is normal. Patient is pleasant with clinician, his affect and mood is appropriate, his speech is clear and coherent, his thoughts are appropriate to mood and circumstance. Patient reports alcohol and marijuana use and denies using any other drugs  (UDS positive for cocaine and THC, BAL 261). Patient denies current SI/HI and AVH. Patient reports history of passive SI with NSSIB of cutting his arm, last cut was six months ago.    Disposition: Per Oneida Alar, NP, recommends overnight observation at ED with psychiatric evaluation tomorrow.    Chief Complaint:  Chief Complaint  Patient presents with  . IVC / SI , Agressive   Visit Diagnosis: Bipolar I disorder (per notes)  Alcohol abuse, Cocaine use    CCA Screening, Triage and Referral (STR)  Patient Reported Information How did you hear about [place]? Legal System  Referral name: Pt is with IVC by mother  Referral phone number: No data recorded  Whom do you see for routine medical problems? I don't have a doctor  Practice/Facility Name: No data recorded Practice/Facility Phone Number: No data recorded Name of Contact: No data recorded Contact Number: No data recorded Contact Fax Number: No data recorded Prescriber Name: No data recorded Prescriber Address (if known): No data recorded  What Is the Reason for Your Visit/Call Today? SA issues and S/I  How Long Has This Been Causing You Problems? 1-6 months  What Do You Feel Would Help You the Most Today? Therapy;Medication   Have You Recently Been in Any Inpatient Treatment (Hospital/Detox/Crisis Center/28-Day Program)? No  Name/Location of Program/Hospital:No data recorded How Long Were You There? No data recorded When Were You Discharged? No data recorded  Have You Ever Received Services From [facility] Before? Yes  Who Do You [facility] at [facility] ED   Have You Recently Had Any Thoughts About Hurting Yourself? No  Are You Planning to Commit Suicide/Harm Yourself At This time? No   Have you Recently Had Thoughts About [place]? No  Explanation: No data recorded  Have You Used Any Alcohol or Drugs in the Past 24 Hours? Yes  How Long Ago Did You Use Drugs or Alcohol? No data recorded What  Did You Use and How Much? Alcohol and Cocaine unknown amount   Do You Currently Have a Therapist/Psychiatrist? Yes  Name of Therapist/Psychiatrist: [place] Recently Discharged From Any Office Practice or Programs? No  Explanation of Discharge From Practice/Program: No data recorded    CCA Screening Triage Referral Assessment Type of Contact: Tele-Assessment  Is this Initial or Reassessment? Initial Assessment  Date Telepsych consult ordered in [facility]:  10/25/20  Time Telepsych consult ordered in [facility]:  No data recorded  Patient Reported Information Reviewed? Yes  Patient Left Without Being Seen? No data recorded Reason for Not Completing Assessment: No data recorded  Collateral Involvement: From mother with patient consent   Does Patient Have a Court Appointed Legal Guardian? No data recorded Name and Contact of Legal Guardian: No data recorded If Minor and Not Living with Parent(s), Who has Custody? No data recorded Is CPS involved or ever been involved? Never  Is APS involved or ever been involved? Never   Patient Determined To Be At Risk for Harm To Self or Others Based on Review of Patient Reported Information or Presenting Complaint? No  Method: No data recorded Availability of Means: No data recorded Intent: No data recorded Notification Required:  No data recorded Additional Information for Danger to Others Potential: No data recorded Additional Comments for Danger to Others Potential: No data recorded Are There Guns or Other Weapons in [place]? No data recorded Types of Guns/Weapons: No data recorded Are These Weapons Safely Secured?                            No data recorded Who Could Verify You Are Able To Have These Secured: No data recorded Do You Have any Outstanding Charges, Pending Court Dates, Parole/Probation? No data recorded Contacted To Inform of Risk of Harm To Self or Others: Other: Comment (NA)   Location of Assessment: [facility]  ED   Does Patient Present under Involuntary Commitment? Yes  IVC Papers Initial File Date: 10/24/20   [place] of Residence: [place]   Patient Currently Receiving the Following Services: Medication Management;Individual Therapy   Determination of Need: Emergent (2 hours)   Options For Referral: Medication Management     CCA Biopsychosocial Intake/Chief Complaint:  Alcohol abuse, anxiety, and bipolar disorder  Current Symptoms/Problems: tremors, not sleeping well, irritablility, anxious,   Patient Reported Schizophrenia/Schizoaffective Diagnosis in Past: No data recorded  Strengths: hard worker, good dad  Preferences: No data recorded Abilities: none reported   Type of Services Patient Feels are Needed: therapy and medication mgnt.   Initial Clinical Notes/Concerns: Drinking   Mental Health Symptoms Depression:  Change in energy/activity;Increase/decrease in appetite   Duration of Depressive symptoms: Greater than two weeks   Mania:  Change in energy/activity;Increased Energy;Irritability;Racing thoughts;Recklessness   Anxiety:   Difficulty concentrating;Worrying   Psychosis:  None   Duration of Psychotic symptoms: No data recorded  Trauma:  None   Obsessions:  None   Compulsions:  None   Inattention:  None   Hyperactivity/Impulsivity:  N/A   Oppositional/Defiant Behaviors:  None   Emotional Irregularity:  None   Other Mood/Personality Symptoms:  No data recorded   Mental Status Exam Appearance and self-care  Stature:  Tall   Weight:  Average weight   Clothing:  Casual   Grooming:  Normal   Cosmetic use:  None   Posture/gait:  Normal   Motor activity:  Not Remarkable   Sensorium  Attention:  Normal   Concentration:  Normal   Orientation:  X5   Recall/memory:  Normal   Affect and Mood  Affect:  Appropriate   Mood:  No data recorded  Relating  Eye contact:  Normal   Facial expression:  No data recorded  Attitude toward  examiner:  Cooperative   Thought and Language  Speech flow: Clear and Coherent   Thought content:  Appropriate to Mood and Circumstances   Preoccupation:  None   Hallucinations:  None   Organization:  No data recorded  [employer] of Knowledge:  Average   Intelligence:  Average   Abstraction:  Normal   Judgement:  Fair   Art therapist:  Adequate   Insight:  Good   Decision Making:  Impulsive   Social Functioning  Social Maturity:  Impulsive   Social Judgement:  "Street Smart"   Stress  Stressors:  Family conflict;Grief/losses;Housing;Legal   Coping Ability:  Overwhelmed   Skill Deficits:  Decision making;Responsibility;Self-control   Supports:  Family;Friends/Service system     Religion: Religion/Spirituality Are You A Religious Person?: Yes  Leisure/Recreation: Leisure / Recreation Do You Have Hobbies?: Yes  Exercise/Diet: Exercise/Diet Do You Exercise?: Yes Have You Gained or Lost A  Significant Amount of Weight in the Past Six Months?: No Do You Follow a Special Diet?: No Do You Have Any Trouble Sleeping?: Yes   CCA Employment/Education Employment/Work Situation: Employment / Work Situation Employment situation: Employed Where is patient currently employed?: 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, Charity fundraiser, 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 Patient's job has been impacted by current illness: Yes What is the longest time patient has a held a job?: 17 years Where was the patient employed at that time?: same as current Has patient ever been in the [employer]?: No  Education: Education Is Patient Currently Attending School?: No Last Grade Completed: 10 Did Teacher, adult education From [employer]?: No Did [place]?: No Did You Attend Graduate School?: No Did You Have An Individualized Education Program (IIEP): No Did You Have Any Difficulty At [employer]?: No   CCA Family/Childhood History Family and Relationship History: Family  history Are you sexually active?: No What is your sexual orientation?: UTA Does patient have children?: Yes  Childhood History:  Childhood History By whom was/is the patient raised?: Grandparents Additional childhood history information: Father never around Description of patient's relationship with caregiver when they were a child: they past away 7 to 8 years ago Does patient have siblings?: Yes Did patient suffer any verbal/emotional/physical/sexual abuse as a child?: No Has patient ever been sexually abused/assaulted/raped as an adolescent or adult?: No Witnessed domestic violence?: No Has patient been affected by domestic violence as an adult?: No  Child/Adolescent Assessment:     CCA Substance Use Alcohol/Drug Use: Alcohol / Drug Use Pain Medications: See MAR Prescriptions: See MAR Over the Counter: See MAR History of alcohol / drug use?: Yes Longest period of sobriety (when/how long): Unknown Negative Consequences of Use: Legal, Personal relationships Withdrawal Symptoms:  (Denies) Substance #1 Name of Substance 1: Alcohol 1 - Age of First Use: 20 1 - Amount (size/oz): "Depends" 1 - Frequency: every other day 1 - Duration: ongoing 1 - Last Use / Amount: 10/24/20/ "few mini bottles and shared pint of liquor with a friend Substance #2 Name of Substance 2: Marijuana 2 - Age of First Use: 16 2 - Amount (size/oz): "Couple of blunts" 2 - Frequency: daily 2 - Duration: ongoing 2 - Last Use / Amount: "four days ago"     ASAM's:  Six Dimensions of Multidimensional Assessment  Dimension 1:  Acute Intoxication and/or Withdrawal Potential:   Dimension 1:  Description of individual's past and current experiences of substance use and withdrawal: Denies  Dimension 2:  Biomedical Conditions and Complications:   Dimension 2:  Description of patient's biomedical conditions and  complications: None noted  Dimension 3:  Emotional, Behavioral, or Cognitive Conditions and  Complications:  Dimension 3:  Description of emotional, behavioral, or cognitive conditions and complications: Pt has ongoing MH issues  Dimension 4:  Readiness to Change:  Dimension 4:  Description of Readiness to Change criteria: Pt is in action stage  Dimension 5:  Relapse, Continued use, or Continued Problem Potential:  Dimension 5:  Relapse, continued use, or continued problem potential critiera description: Pt suffers from contnued relpase  Dimension 6:  Recovery/Living Environment:  Dimension 6:  Recovery/Iiving environment criteria description: Pt has primary supports  ASAM Severity Score: ASAM's Severity Rating Score: 9  ASAM Recommended Level of Treatment: ASAM Recommended Level of Treatment: Level I Outpatient Treatment   Substance use Disorder (SUD) Substance Use Disorder (SUD)  Checklist Symptoms of Substance Use: Continued use despite having a persistent/recurrent physical/psychological problem caused/exacerbated  by use, Continued use despite persistent or recurrent social, interpersonal problems, caused or exacerbated by use  Recommendations for Services/Supports/Treatments: Recommendations for Services/Supports/Treatments Recommendations For Services/Supports/Treatments: SAIOP (Substance Abuse Intensive Outpatient Program) ([facility] pt currently enrolled)  DSM5 Diagnoses: Patient Active Problem List   Diagnosis Date Noted  . Substance induced mood disorder ([place])   . Bipolar I disorder ([place]) 07/07/2020  . Maxillary sinus fracture, closed, initial encounter ([place]) 06/04/2020  . Closed fracture of medial portion of right tibial plateau with routine healing 07/03/2019  . Open fracture of right proximal tibia 04/24/2019  . Encounter to establish care 04/24/2019  . 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 Alcohol dependence with uncomplicated withdrawal ([place]) 03/06/2016  . Alcohol abuse 09/26/2015  . Generalized anxiety disorder 07/04/2013  . Impulse control disorder  07/04/2013  . Self-mutilation 07/04/2013    Disposition: Per Oneida Alar, NP, recommends overnight observation at ED with psychiatric evaluation tomorrow.  [place], [facility]

## 2020-10-26 DIAGNOSIS — F1010 Alcohol abuse, uncomplicated: Secondary | ICD-10-CM

## 2020-10-26 NOTE — ED Notes (Signed)
[phone] Sister Mickel Baas would like an update.

## 2020-10-26 NOTE — Consult Note (Addendum)
Telepsych Consultation   Patient Identification: MARTEZ WEIAND MRN:  [id number] Principal Diagnosis: Alcohol abuse Diagnosis:  Principal Problem:   Alcohol abuse   Total Time spent with patient: 15 minutes   HPI:  Reassessment: Patient seen via telepsych. Chart reviewed. Mr. Schoeppner is a 34 year old male with history of alcohol use disorder and bipolar disorder, presenting under IVC from his family for aggressive behaviors with self-harm statements while intoxicated with alcohol (see below).  On assessment today, patient is pleasant and cooperative. He continues to deny memory of events from the night of admission and states he is confused about why he is in the hospital. He denies memory of aggressive behaviors or self-harm statements. He does remember drinking before he was brought to the hospital. He denies any suicidal thoughts over the last two weeks. He denies any thoughts of harming others. He denies AVH and shows no signs of responding to internal stimuli. He admits to history of problems with alcohol and states he feels he needs to cut down on his drinking. He goes to [facility] for [facility] use and reports missing appointments due to having COVID recently but plans to start attending [facility] again. He states that he works three different jobs and plans to stay busy with work to help him avoid substance use. He is also seen at [facility] for medication management and reports compliance with home medications Lamictal, Remeron, and PRN Vistaril.  Per TTS assessment 10/25/2020: Graysyn Bache is a 34 year old male presenting to [facility] under IVC for SI and aggressive behaviors while intoxicated. Patient reports drinking with some friends last night. Patient reports sharing a pint of fireball with one friend and drinking a couple mini bottles of liquor. Patient states after his friend left his house he went to his neighbor's house and shortly after being at his next-door neighbor's house the police came and  suggested that he go to hospital to get checked out. Patient states he was hesitant at first but then reports that he was willing to go. Patient reports when he arrived at hospital, he was notified that he was IVC'd and became combative because he was lied to and had no idea why he was at the hospital in the first place. Patient denies making any SI/HI statements, he denies any aggression and reports that he was "teary drunk and emotional" last night. Clinician assessed for stressors and patient reports "I can't see my kids, I'm on probation, I work three jobs. I'm just trying to stay on my feet." Patient states that he does not know who IVC'd him or why. Upon further exploration, patient states that he could have called his mother when he was drunk, but patient denies making SI statements. Patient is on supervised probation and his next probation appointment is 11/11/20. Patient also attends [facility] with next appointment on 11/25/20.      Per IVC "respondent suffers from depression, bipolar, anxiety and alcoholism and takes his medications. Was last committed in [place] or June. Says he wants to kill himself by burning himself. He is very confused about what other are saying or doing. Is very aggressive by hitting tables, punching the hardwood floor and busting his cell phone. Respondent self mutilates and drinks everyday".  Patient reports receiving therapy and medication management at [facility]. Patient states his last appointment with therapist was a month ago and he had telehealth appointment with psychiatrist a couple weeks ago. Patient reports compliance with his medications. Patient provides consent for TTS to contact his mother Estill Bamberg  Green for collateral.     Mom reports that she received a call from patient neighbor reporting that patient was displaying aggressive behaviors, falling flat on his face, blacking out and then waking up with same aggressive behaviors. Mom states that neighbor witness patient burn  himself with a cigarette and then patient made statements that he was going to go home and slit his wrist. Mom states that she called police and was advised to file IVC paperwork by police due to patient aggressive behaviors. Mom reports patient behaviors starting at the age of 36 after his grandfather passed away. Mom reports patient was cutting inside his thigh, aggression and drinking since age of 34. Mom states that patient has had multiple arrest and accidents due to alcohol use. Mom states that patient is a felon on supervised probation and has failed multiple drug test. Mom states that patient received rehab services in [place] and lived in an [place] before. Mom states that patient is less aggressive when he is not intoxicated. Mom states that patient roommate is a 35 year old male, and he is "terrified" of patient behaviors. Mom states that patient could be at risk of being homeless due to alcohol use. Mom states that she cannot say if patient will be safe if discharge due to him possibly being verbally aggressive towards neighbors and roommate.     Patient is oriented x4, engaged, alert and cooperative. Patient eye contact and tone of voice is normal. Patient is pleasant with clinician, his affect and mood is appropriate, his speech is clear and coherent, his thoughts are appropriate to mood and circumstance. Patient reports alcohol and marijuana use and denies using any other drugs (UDS positive for cocaine and THC, BAL 261). Patient denies current SI/HI and AVH. Patient reports history of passive SI with NSSIB of cutting his arm, last cut was six months ago.   Disposition: Patient with alcohol-induced aggressive behaviors and self-harm statements. Sober, calm, and cooperative at this time reporting euthymic mood. Patient shows no evidence of acute risk of harm to self or others and is psych cleared for discharge. ED staff updated.   Past Psychiatric History: See above  Risk to Self:    Risk to Others:   Prior Inpatient Therapy:   Prior Outpatient Therapy:    Past Medical History:  Past Medical History:  Diagnosis Date  . Anxiety   . Depression   . ETOH abuse   . GERD (gastroesophageal reflux disease)   . Gonorrhea 2016   History reviewed. No pertinent surgical history. Family History:  Family History  Problem Relation Age of Onset  . Hypertension Other   . Diabetes Other    Family Psychiatric  History: Unknown Social History:  Social History   Substance and Sexual Activity  Alcohol Use Yes   Comment: 1 pint per day      Social History   Substance and Sexual Activity  Drug Use Yes  . Types: Marijuana   Comment: crack    Social History   Socioeconomic History  . Marital status: Single    Spouse name: Not on file  . Number of children: Not on file  . Years of education: Not on file  . Highest education level: Not on file  Occupational History  . Occupation: Chief Executive Officer: [employer]  Tobacco Use  . Smoking status: Current Every Day Smoker    Packs/day: 0.75    Types: Cigarettes  . Smokeless tobacco: Never Used  Vaping Use  .  Vaping Use: Former  . Quit date: 04/22/2018  Substance and Sexual Activity  . Alcohol use: Yes    Comment: 1 pint per day   . Drug use: Yes    Types: Marijuana    Comment: crack  . Sexual activity: Not Currently    Partners: Female  Other Topics Concern  . Not on file  Social History Narrative   ** Merged History Encounter **       Social Determinants of Health   Financial Resource Strain: Low Risk   . Difficulty of Paying Living Expenses: Not hard at all  Food Insecurity: Food Insecurity Present  . Worried About Charity fundraiser in the Last Year: Sometimes true  . Ran Out of Food in the Last Year: Never true  Transportation Needs: No Transportation Needs  . Lack of Transportation (Medical): No  . Lack of Transportation (Non-Medical): No  Physical Activity: Sufficiently Active  . Days  of Exercise per Week: 5 days  . Minutes of Exercise per Session: 60 min  Stress: Stress Concern Present  . Feeling of Stress : Rather much  Social Connections: Moderately Isolated  . Frequency of Communication with Friends and Family: More than three times a week  . Frequency of Social Gatherings with Friends and Family: More than three times a week  . Attends Religious Services: Never  . Active Member of Clubs or Organizations: Yes  . Attends Archivist Meetings: 1 to 4 times per year  . Marital Status: Separated   Additional Social History:    Allergies:  No Known Allergies  Labs:  Results for orders placed or performed during the hospital encounter of 10/24/20 (from the past 48 hour(s))  Rapid urine drug screen (hospital performed)     Status: Abnormal   Collection Time: 10/24/20  9:00 PM  Result Value Ref Range   Opiates NONE DETECTED NONE DETECTED   Cocaine POSITIVE (A) NONE DETECTED   Benzodiazepines NONE DETECTED NONE DETECTED   Amphetamines NONE DETECTED NONE DETECTED   Tetrahydrocannabinol POSITIVE (A) NONE DETECTED   Barbiturates NONE DETECTED NONE DETECTED    Comment: (NOTE) DRUG SCREEN FOR MEDICAL PURPOSES ONLY.  IF CONFIRMATION IS NEEDED FOR ANY PURPOSE, NOTIFY LAB WITHIN 5 DAYS.  LOWEST DETECTABLE LIMITS FOR URINE DRUG SCREEN Drug Class                     Cutoff (ng/mL) Amphetamine and metabolites    1000 Barbiturate and metabolites    200 Benzodiazepine                 353 Tricyclics and metabolites     300 Opiates and metabolites        300 Cocaine and metabolites        300 THC                            50 Performed at [facility], [place] [address], [place], [place] [address]   Comprehensive metabolic panel     Status: Abnormal   Collection Time: 10/24/20  9:15 PM  Result Value Ref Range   Sodium 142 135 - 145 mmol/L   Potassium 4.1 3.5 - 5.1 mmol/L   Chloride 107 98 - 111 mmol/L   CO2 23 22 - 32 mmol/L   Glucose, Bld 116 (H) 70  - 99 mg/dL    Comment: Glucose reference range applies only to samples taken  after fasting for at least 8 hours.   BUN 7 6 - 20 mg/dL   Creatinine, Ser 0.87 0.61 - 1.24 mg/dL   Calcium 9.2 8.9 - 10.3 mg/dL   Total Protein 8.0 6.5 - 8.1 g/dL   Albumin 4.9 3.5 - 5.0 g/dL   AST 34 15 - 41 U/L   ALT 35 0 - 44 U/L   Alkaline Phosphatase 46 38 - 126 U/L   Total Bilirubin 0.5 0.3 - 1.2 mg/dL   GFR, Estimated >60 >60 mL/min    Comment: (NOTE) Calculated using the CKD-EPI Creatinine Equation (2021)    Anion gap 12 5 - 15    Comment: Performed at [facility] [address] [place], [place] [address]  Ethanol     Status: Abnormal   Collection Time: 10/24/20  9:15 PM  Result Value Ref Range   Alcohol, Ethyl (B) 261 (H) <10 mg/dL    Comment: (NOTE) Lowest detectable limit for serum alcohol is 10 mg/dL.  For medical purposes only. Performed at [facility], [place] [address], [place], [place] [address]   Salicylate level     Status: Abnormal   Collection Time: 10/24/20  9:15 PM  Result Value Ref Range   Salicylate Lvl <5.0 (L) 7.0 - 30.0 mg/dL    Comment: Performed at [facility] [address], [place], [place] [address]  Acetaminophen level     Status: Abnormal   Collection Time: 10/24/20  9:15 PM  Result Value Ref Range   Acetaminophen (Tylenol), Serum <10 (L) 10 - 30 ug/mL    Comment: (NOTE) Therapeutic concentrations vary significantly. A range of 10-30 ug/mL  may be an effective concentration for many patients. However, some  are best treated at concentrations outside of this range. Acetaminophen concentrations >150 ug/mL at 4 hours after ingestion  and >50 ug/mL at 12 hours after ingestion are often associated with  toxic reactions.  Performed at [facility], [place] [address] [place], [place] [address]   cbc     Status: Abnormal   Collection Time: 10/24/20  9:15 PM  Result Value Ref Range   WBC 10.3 4.0 - 10.5 K/uL   RBC 5.22 4.22 - 5.81 MIL/uL    Hemoglobin 17.6 (H) 13.0 - 17.0 g/dL   HCT 51.9 39 - 52 %   MCV 99.4 80.0 - 100.0 fL   MCH 33.7 26.0 - 34.0 pg   MCHC 33.9 30.0 - 36.0 g/dL   RDW 12.6 11.5 - 15.5 %   Platelets 330 150 - 400 K/uL   nRBC 0.0 0.0 - 0.2 %    Comment: Performed at [facility], [place] [address], [place], [place] [address]    Medications:  Current Facility-Administered Medications  Medication Dose Route Frequency Provider Last Rate Last Admin  . folic acid (FOLVITE) tablet 1 mg  1 mg Oral Daily Drenda Freeze, MD   1 mg at 10/26/20 1025  . lamoTRIgine (LAMICTAL) tablet 25 mg  25 mg Oral Daily Leevy-Johnson, Brooke A, NP   25 mg at 10/26/20 1025  . LORazepam (ATIVAN) tablet 1-4 mg  1-4 mg Oral Q1H PRN Drenda Freeze, MD       Or  . LORazepam (ATIVAN) injection 1-4 mg  1-4 mg Intravenous Q1H PRN Drenda Freeze, MD      . LORazepam (ATIVAN) tablet 0-4 mg  0-4 mg Oral Q6H Drenda Freeze, MD   1 mg at 10/25/20  2328   Followed by  . LORazepam (ATIVAN) tablet 0-4 mg  0-4 mg Oral Q12H Drenda Freeze, MD      . mirtazapine (REMERON SOL-TAB) disintegrating tablet 15 mg  15 mg Oral QHS Leevy-Johnson, Brooke A, NP   15 mg at 10/25/20 2302  . multivitamin with minerals tablet 1 tablet  1 tablet Oral Daily Drenda Freeze, MD   1 tablet at 10/26/20 1025  . nicotine (NICODERM CQ - dosed in mg/24 hours) patch 21 mg  21 mg Transdermal Daily Leevy-Johnson, Brooke A, NP   21 mg at 10/26/20 1026  . thiamine tablet 100 mg  100 mg Oral Daily Drenda Freeze, MD   100 mg at 10/26/20 1025   Or  . thiamine (B-1) injection 100 mg  100 mg Intravenous Daily Drenda Freeze, MD       Current Outpatient Medications  Medication Sig Dispense Refill  . HYDROcodone-acetaminophen (NORCO/VICODIN) 5-325 MG tablet Take 1 tablet by mouth 3 (three) times daily as needed for moderate pain. 21 tablet 0  . hydrOXYzine (ATARAX/VISTARIL) 10 MG tablet Take 1 tablet (10 mg total) by mouth 3 (three) times daily as  needed. 90 tablet 2  . ibuprofen (ADVIL) 800 MG tablet Take 1 tablet (800 mg total) by mouth every 8 (eight) hours as needed. 21 tablet 0  . lamoTRIgine (LAMICTAL) 25 MG tablet Take 2 tablets (50 mg total) by mouth daily. 50 tablet 2  . lidocaine (LIDODERM) 5 % Place 1 patch onto the skin daily. Remove & Discard patch within 12 hours or as directed by MD 30 patch 0  . mirtazapine (REMERON) 15 MG tablet Take 1 tablet (15 mg total) by mouth at bedtime. 30 tablet 2  . nicotine (NICODERM CQ - DOSED IN MG/24 HOURS) 21 mg/24hr patch Place 1 patch (21 mg total) onto the skin daily. 28 patch 2  . penicillin v potassium (VEETID) 500 MG tablet Take 1 tablet (500 mg total) by mouth 4 (four) times daily. 28 tablet 0  . thiamine 100 MG tablet Take 1 tablet (100 mg total) by mouth daily.    . traMADol (ULTRAM) 50 MG tablet Take 1 tablet (50 mg total) by mouth every 6 (six) hours as needed for severe pain. 15 tablet 0      Psychiatric Specialty Exam: Physical Exam  Review of Systems  Blood pressure 116/83, pulse 78, temperature 97.8 F (36.6 C), temperature source Oral, resp. rate 20, SpO2 99 %.There is no height or weight on file to calculate BMI.  General Appearance: Casual  Eye Contact:  Good  Speech:  Normal Rate  Volume:  Normal  Mood:  Euthymic  Affect:  Appropriate and Congruent  Thought Process:  Coherent and Goal Directed  Orientation:  Full (Time, Place, and Person)  Thought Content:  Logical  Suicidal Thoughts:  No  Homicidal Thoughts:  No  Memory:  Immediate;   Good Recent;   Fair Remote;   Good  Judgement:  Intact  Insight:  Fair  Psychomotor Activity:  Normal  Concentration:  Concentration: Good and Attention Span: Good  Recall:  [employer] of Knowledge:  Fair  Language:  Good  Akathisia:  No  Handed:  Right  AIMS (if indicated):     Assets:  Communication Skills Desire for Improvement Housing Vocational/Educational  ADL's:  Intact  Cognition:  WNL  Sleep:         Disposition: Patient with alcohol-induced aggressive behaviors and self-harm statements. Sober, calm,  and cooperative at this time reporting euthymic mood. Patient shows no evidence of acute risk of harm to self or others and is psych cleared for discharge. ED staff updated.  This service was provided via telemedicine using a 2-way, interactive audio and video technology.   Connye Burkitt, NP 10/26/2020 11:25 AM

## 2020-10-26 NOTE — ED Notes (Signed)
Belonging given to patient

## 2020-10-26 NOTE — ED Provider Notes (Addendum)
Emergency Medicine Observation Re-evaluation Note  STAVROS CAIL is a 34 y.o. male, seen on rounds today.  Pt initially presented to the ED for complaints of IVC Currently, the patient is awaiting a splint..  Physical Exam  BP 116/83 (BP Location: Right Arm)   Pulse 78   Temp 97.8 F (36.6 C) (Oral)   Resp 20   SpO2 99%  Physical Exam Patient was in the bathroom at the time of my rounding.  ED Course / MDM  EKG:    I have reviewed the labs performed to date as well as medications administered while in observation.  Recent changes in the last 24 hours include seen by psych and reevaluation in the morning..  Plan  Current plan is for reevaluation this morning by psychiatry.. Patient is under full IVC at this time.   Davonna Belling, MD 10/26/20 1049  Patient has been seen by psychiatry and cleared for discharge.  If psychiatry is not reversed their IVC I will do that.    Davonna Belling, MD 10/26/20 1128

## 2021-01-06 MED FILL — lamoTRIgine 25 MG TABS: 25 | 25 days supply | Qty: 50 | Fill #1

## 2022-12-08 DIAGNOSIS — R059 Cough, unspecified: Secondary | ICD-10-CM | POA: Diagnosis not present

## 2022-12-08 DIAGNOSIS — U071 COVID-19: Secondary | ICD-10-CM | POA: Diagnosis not present

## 2023-01-05 DIAGNOSIS — R059 Cough, unspecified: Secondary | ICD-10-CM | POA: Diagnosis not present

## 2023-01-05 DIAGNOSIS — J101 Influenza due to other identified influenza virus with other respiratory manifestations: Secondary | ICD-10-CM | POA: Diagnosis not present

## 2023-01-05 DIAGNOSIS — M791 Myalgia, unspecified site: Secondary | ICD-10-CM | POA: Diagnosis not present

## 2023-02-07 ENCOUNTER — Telehealth: Payer: Self-pay

## 2023-02-07 NOTE — Telephone Encounter (Signed)
Mychart msg sent

## 2023-04-11 ENCOUNTER — Encounter (INDEPENDENT_AMBULATORY_CARE_PROVIDER_SITE_OTHER): Payer: Self-pay | Admitting: Primary Care

## 2023-04-11 ENCOUNTER — Ambulatory Visit (INDEPENDENT_AMBULATORY_CARE_PROVIDER_SITE_OTHER): Payer: Medicaid Other | Admitting: Primary Care

## 2023-04-11 VITALS — BP 145/87 | HR 83 | Resp 16 | Ht 69.0 in | Wt 162.4 lb

## 2023-04-11 DIAGNOSIS — N50811 Right testicular pain: Secondary | ICD-10-CM

## 2023-04-11 DIAGNOSIS — Z7689 Persons encountering health services in other specified circumstances: Secondary | ICD-10-CM | POA: Diagnosis not present

## 2023-04-11 DIAGNOSIS — F411 Generalized anxiety disorder: Secondary | ICD-10-CM

## 2023-04-11 DIAGNOSIS — R030 Elevated blood-pressure reading, without diagnosis of hypertension: Secondary | ICD-10-CM

## 2023-04-11 DIAGNOSIS — D229 Melanocytic nevi, unspecified: Secondary | ICD-10-CM | POA: Diagnosis not present

## 2023-04-11 DIAGNOSIS — Z131 Encounter for screening for diabetes mellitus: Secondary | ICD-10-CM

## 2023-04-11 DIAGNOSIS — I861 Scrotal varices: Secondary | ICD-10-CM | POA: Diagnosis not present

## 2023-04-11 DIAGNOSIS — Z1159 Encounter for screening for other viral diseases: Secondary | ICD-10-CM | POA: Diagnosis not present

## 2023-04-11 DIAGNOSIS — L249 Irritant contact dermatitis, unspecified cause: Secondary | ICD-10-CM

## 2023-04-11 NOTE — Progress Notes (Signed)
New Patient Office Visit  Subjective    Patient ID: Thomas Hunter, male    DOB: 1986/03/03  Age: 37 y.o. MRN: [id number]  CC:  Chief Complaint  Patient presents with   New Patient (Initial Visit)   Cyst    Right testicle  Noticed it between 5-7 years ago Sometimes painful     HPI Thomas Hunter is a 37 year old normal weight male presents to establish care. His has given girlfriend permission Vernona Rieger to be present at his appt.  Elevated Bp initial visit -smokes 1 ppd a day for 21 years. Drinks 5 beers a day after work. His main concern is Cyst on right testicle Noticed it between 5-7 years ago and Sometimes painful - sex and active movement causes pain in groin area. Patient has No headache, No chest pain, No abdominal pain - No Nausea, No new weakness tingling or numbness, No Cough. He has shortness of breath made aware that the beginning of the year he was dx with COVID started feeling better approximately a month than develop pneumonia. He feels as though shortness of breath became worst after both virus.  Outpatient Encounter Medications as of 04/11/2023  Medication Sig   HYDROcodone-acetaminophen (NORCO/VICODIN) 5-325 MG tablet Take 1 tablet by mouth 3 (three) times daily as needed for moderate pain. (Patient not taking: Reported on 04/11/2023)   hydrOXYzine (ATARAX/VISTARIL) 10 MG tablet Take 1 tablet (10 mg total) by mouth 3 (three) times daily as needed. (Patient not taking: Reported on 04/11/2023)   ibuprofen (ADVIL) 800 MG tablet Take 1 tablet (800 mg total) by mouth every 8 (eight) hours as needed. (Patient not taking: Reported on 04/11/2023)   lamoTRIgine (LAMICTAL) 25 MG tablet Take 2 tablets (50 mg total) by mouth daily. (Patient not taking: Reported on 04/11/2023)   lidocaine (LIDODERM) 5 % Place 1 patch onto the skin daily. Remove & Discard patch within 12 hours or as directed by MD (Patient not taking: Reported on 04/11/2023)   mirtazapine (REMERON) 15 MG tablet Take 1  tablet (15 mg total) by mouth at bedtime. (Patient not taking: Reported on 04/11/2023)   nicotine (NICODERM CQ - DOSED IN MG/24 HOURS) 21 mg/24hr patch Place 1 patch (21 mg total) onto the skin daily. (Patient not taking: Reported on 04/11/2023)   penicillin v potassium (VEETID) 500 MG tablet Take 1 tablet (500 mg total) by mouth 4 (four) times daily. (Patient not taking: Reported on 04/11/2023)   thiamine 100 MG tablet Take 1 tablet (100 mg total) by mouth daily. (Patient not taking: Reported on 04/11/2023)   traMADol (ULTRAM) 50 MG tablet Take 1 tablet (50 mg total) by mouth every 6 (six) hours as needed for severe pain. (Patient not taking: Reported on 04/11/2023)   No facility-administered encounter medications on file as of 04/11/2023.    Past Medical History:  Diagnosis Date   Anxiety    Depression    ETOH abuse    GERD (gastroesophageal reflux disease)    Gonorrhea 2016    No past surgical history on file.  Family History  Problem Relation Age of Onset   Hypertension Other    Diabetes Other     Social History   Socioeconomic History   Marital status: Single    Spouse name: Not on file   Number of children: Not on file   Years of education: Not on file   Highest education level: Not on file  Occupational History   Occupation: Mining engineer  Employer: [employer]  Tobacco Use   Smoking status: Every Day    Packs/day: .75    Types: Cigarettes   Smokeless tobacco: Never  Vaping Use   Vaping Use: Former   Quit date: 04/22/2018  Substance and Sexual Activity   Alcohol use: Yes    Comment: 1 pint per day    Drug use: Yes    Types: Marijuana    Comment: crack   Sexual activity: Not Currently    Partners: Female  Other Topics Concern   Social Determinants of Health   Financial Resource Strain: Low Risk  (05/25/2020)   Overall Financial Resource Strain (CARDIA)    Difficulty of Paying Living Expenses: Not hard at all  Food Insecurity: Food Insecurity Present  (05/25/2020)   Hunger Vital Sign    Worried About Running Out of Food in the Last Year: Sometimes true    Ran Out of Food in the Last Year: Never true  Transportation Needs: No Transportation Needs (05/25/2020)   PRAPARE - Administrator, Civil Service (Medical): No    Lack of Transportation (Non-Medical): No  Physical Activity: Sufficiently Active (05/25/2020)   Exercise Vital Sign    Days of Exercise per Week: 5 days    Minutes of Exercise per Session: 60 min  Stress: Stress Concern Present (05/25/2020)   [employer] of Occupational Health - Occupational Stress Questionnaire    Feeling of Stress : Rather much  Social Connections: Moderately Isolated (05/25/2020)   Social Connection and Isolation Panel [NHANES]    Frequency of Communication with Friends and Family: More than three times a week    Frequency of Social Gatherings with Friends and Family: More than three times a week    Attends Religious Services: Never    Database administrator or Organizations: Yes    Attends Banker Meetings: 1 to 4 times per year    Marital Status: Separated  Intimate Partner Violence: Not At Risk (05/25/2020)   Humiliation, Afraid, Rape, and Kick questionnaire    Fear of Current or Ex-Partner: No    Emotionally Abused: No    Physically Abused: No    Sexually Abused: No    ROS Comprehensive ROS Pertinent positive and negative noted in HPI     Objective    Blood Pressure (Abnormal) 145/87 (BP Location: Left Arm, Patient Position: Sitting, Cuff Size: Normal)   Pulse 83   Respiration 16   Height 5\' 9"  (1.753 m)   Weight 162 lb 6.4 oz (73.7 kg)   Oxygen Saturation 99%   Body Mass Index 23.98 kg/m  Physical exam: General: Vital signs reviewed.  Patient is well-developed and well-nourished, normal weight in no acute distress and cooperative with exam. Head: Normocephalic and atraumatic. Eyes: EOMI, conjunctivae normal, no scleral icterus. Neck: Supple, trachea  midline, normal ROM, no JVD, masses, thyromegaly, or carotid bruit present. Cardiovascular: RRR, S1 normal, S2 normal, no murmurs, gallops, or rubs. Pulmonary/Chest: Clear to auscultation bilaterally, no wheezes, rales, or rhonchi. Abdominal: Soft, non-tender, non-distended, BS +, no masses, organomegaly, or guarding present. Musculoskeletal: No joint deformities, erythema, or stiffness, ROM full and nontender. Extremities: No lower extremity edema bilaterally,  pulses symmetric and intact bilaterally. No cyanosis or clubbing. Neurological: A&O x3, Strength is normal Skin: Warm, dry and intact. mole on right upper shoulder see pic Psychiatric: Normal mood and affect. speech and behavior is normal. Cognition and memory are normal.   Assessment & Plan:  Arash was seen today for  new patient (initial visit) and cyst.  Diagnoses and all orders for this visit:  Encounter to establish care  Generalized anxiety disorder Refer to CSW   Pain in right testicle 2/2 Varicocele Ambulatory referral to Urology   Encounter for HCV screening test for low risk patient -     HCV Ab w Reflex to Quant PCR  Change in skin mole   Screening for diabetes mellitus -     Hemoglobin A1c -     CBC with Differential  Encounter to establish care  Elevated BP without diagnosis of hypertension -     CMP14+EGFR  Encounter for HCV screening test for low risk patient -     HCV Ab w Reflex to Quant PCR   Irritant contact dermatitis, unspecified trigger    Grayce Sessions, NP

## 2023-04-11 NOTE — Patient Instructions (Signed)
Preventing Hypertension Hypertension, also called high blood pressure, is when the force of blood pumping through the arteries is too strong. Arteries are blood vessels that carry blood from the heart throughout the body. Often, hypertension does not cause symptoms until blood pressure is very high. It is important to have your blood pressure checked regularly. Diet and lifestyle changes can help you prevent hypertension, and they may make you feel better overall and improve your quality of life. If you already have hypertension, you may control it with diet and lifestyle changes, as well as with medicine. How can this condition affect me? Over time, hypertension can damage the arteries and decrease blood flow to important parts of the body, including the brain, heart, and kidneys. By keeping your blood pressure in a healthy range, you can help prevent complications like heart attack, heart failure, stroke, kidney failure, and vascular dementia. What can increase my risk? An unhealthy diet and a lack of physical activity can make you more likely to develop high blood pressure. Some other risk factors include: Age. The risk increases with age. Having family members who have had high blood pressure. Having certain health conditions, such as thyroid problems. Being overweight or obese. Drinking too much alcohol or caffeine. Having too much fat, sugar, calories, or salt (sodium) in your diet. Smoking or using illegal drugs. Taking certain medicines, such as antidepressants, decongestants, birth control pills, and NSAIDs, such as ibuprofen. What actions can I take to prevent or manage this condition? Work with your health care provider to make a hypertension prevention plan that works for you. You may be referred for counseling on a healthy diet and physical activity. Follow your plan and keep all follow-up visits. Diet changes Maintain a healthy diet. This includes: Eating less salt (sodium). Ask your  health care provider how much sodium is safe for you to have. The general recommendation is to have less than 1 tsp (2,300 mg) of sodium a day. Do not add salt to your food. Choose low-sodium options when grocery shopping and eating out. Limiting fats in your diet. You can do this by eating low-fat or fat-free dairy products and by eating less red meat. Eating more fruits, vegetables, and whole grains. Make a goal to eat: 1-2 cups of fresh fruits and vegetables each day. 3-4 servings of whole grains each day. Avoiding foods and beverages that have added sugars. Eating fish that contain healthy fats (omega-3 fatty acids), such as mackerel or salmon. If you need help putting together a healthy eating plan, try the DASH diet. This diet is high in fruits, vegetables, and whole grains. It is low in sodium, red meat, and added sugars. DASH stands for Dietary Approaches to Stop Hypertension. Lifestyle changes  Lose weight if you are overweight. Losing just 3-5% of your body weight can help prevent or control hypertension. For example, if your present weight is 200 lb (91 kg), a loss of 3-5% of your weight means losing 6-10 lb (2.7-4.5 kg). Ask your health care provider to help you with a diet and exercise plan to safely lose weight. Get enough exercise. Do at least 150 minutes of moderate-intensity exercise each week. You could do this in short exercise sessions several times a day, or you could do longer exercise sessions a few times a week. For example, you could take a brisk 10-minute walk or bike ride, 3 times a day, for 5 days a week. Find ways to reduce stress, such as exercising, meditating, listening to   music, or taking a yoga class. If you need help reducing stress, ask your health care provider. Do not use any products that contain nicotine or tobacco. These products include cigarettes, chewing tobacco, and vaping devices, such as e-cigarettes. Chemicals in tobacco and nicotine products raise your  blood pressure each time you use them. If you need help quitting, ask your health care provider. Learn how to check your blood pressure at home. Make sure that you know your personal target blood pressure, as told by your health care provider. Try to sleep 7-9 hours per night. Alcohol use Do not drink alcohol if: Your health care provider tells you not to drink. You are pregnant, may be pregnant, or are planning to become pregnant. If you drink alcohol: Limit how much you have to: 0-1 drink a day for women. 0-2 drinks a day for men. Know how much alcohol is in your drink. In the [place] one drink equals one 12 oz bottle of beer (355 mL), one 5 oz glass of wine (148 mL), or one 1 oz glass of hard liquor (44 mL). Medicines In addition to diet and lifestyle changes, your health care provider may recommend medicines to help lower your blood pressure. In general: You may need to try a few different medicines to find what works best for you. You may need to take more than one medicine. Take over-the-counter and prescription medicines only as told by your health care provider. Questions to ask your health care provider What is my blood pressure goal? How can I lower my risk for high blood pressure? How should I monitor my blood pressure at home? Where to find support Your health care provider can help you prevent hypertension and help you keep your blood pressure at a healthy level. Your local hospital or your community may also provide support services and prevention programs. The American Heart Association offers an online support network at supportnetwork.heart.org Where to find more information Learn more about hypertension from: National Heart, Lung, and Blood Institute: www.nhlbi.nih.gov Centers for Disease Control and Prevention: www.cdc.gov American Academy of Family Physicians: familydoctor.org Learn more about the DASH diet from: National Heart, Lung, and Blood Institute:  www.nhlbi.nih.gov Contact a health care provider if: You think you are having a reaction to medicines you have taken. You have recurrent headaches or feel dizzy. You have swelling in your ankles. You have trouble with your vision. Get help right away if: You have sudden, severe chest, back, or abdominal pain or discomfort. You have shortness of breath. You have a sudden, severe headache. These symptoms may be an emergency. Get help right away. Call 911. Do not wait to see if the symptoms will go away. Do not drive yourself to the hospital. Summary Hypertension often does not cause any symptoms until blood pressure is very high. It is important to get your blood pressure checked regularly. Diet and lifestyle changes are important steps in preventing hypertension. By keeping your blood pressure in a healthy range, you may prevent complications like heart attack, heart failure, stroke, and kidney failure. Work with your health care provider to make a hypertension prevention plan that works for you. This information is not intended to replace advice given to you by your health care provider. Make sure you discuss any questions you have with your health care provider. Document Revised: 09/16/2021 Document Reviewed: 09/16/2021 Elsevier Patient Education  2023 Elsevier Inc.  

## 2023-04-12 LAB — CMP14+EGFR
ALT: 32 IU/L (ref 0–44)
AST: 19 IU/L (ref 0–40)
Albumin/Globulin Ratio: 1.9 (ref 1.2–2.2)
Albumin: 4.7 g/dL (ref 4.1–5.1)
Alkaline Phosphatase: 64 IU/L (ref 44–121)
BUN/Creatinine Ratio: 12 (ref 9–20)
BUN: 9 mg/dL (ref 6–20)
Bilirubin Total: 0.3 mg/dL (ref 0.0–1.2)
CO2: 20 mmol/L (ref 20–29)
Calcium: 9.4 mg/dL (ref 8.7–10.2)
Chloride: 104 mmol/L (ref 96–106)
Creatinine, Ser: 0.73 mg/dL — ABNORMAL LOW (ref 0.76–1.27)
Globulin, Total: 2.5 g/dL (ref 1.5–4.5)
Glucose: 102 mg/dL — ABNORMAL HIGH (ref 70–99)
Potassium: 4.7 mmol/L (ref 3.5–5.2)
Sodium: 141 mmol/L (ref 134–144)
Total Protein: 7.2 g/dL (ref 6.0–8.5)
eGFR: 120 mL/min/{1.73_m2} (ref >59)

## 2023-04-12 LAB — CBC WITH DIFFERENTIAL/PLATELET
Basophils Absolute: 0.1 10*3/uL (ref 0.0–0.2)
Basos: 1 %
EOS (ABSOLUTE): 0.1 10*3/uL (ref 0.0–0.4)
Eos: 1 %
Hematocrit: 48.1 % (ref 37.5–51.0)
Hemoglobin: 17 g/dL (ref 13.0–17.7)
Immature Grans (Abs): 0 10*3/uL (ref 0.0–0.1)
Immature Granulocytes: 0 %
Lymphocytes Absolute: 2 10*3/uL (ref 0.7–3.1)
Lymphs: 26 %
MCH: 34.1 pg — ABNORMAL HIGH (ref 26.6–33.0)
MCHC: 35.3 g/dL (ref 31.5–35.7)
MCV: 96 fL (ref 79–97)
Monocytes Absolute: 0.7 10*3/uL (ref 0.1–0.9)
Monocytes: 8 %
Neutrophils Absolute: 5 10*3/uL (ref 1.4–7.0)
Neutrophils: 64 %
Platelets: 280 10*3/uL (ref 150–450)
RBC: 4.99 x10E6/uL (ref 4.14–5.80)
RDW: 12.7 % (ref 11.6–15.4)
WBC: 7.8 10*3/uL (ref 3.4–10.8)

## 2023-04-12 LAB — HCV AB W REFLEX TO QUANT PCR: HCV Ab: NONREACTIVE

## 2023-04-12 LAB — HEMOGLOBIN A1C
Est. average glucose Bld gHb Est-mCnc: 111 mg/dL
Hgb A1c MFr Bld: 5.5 % (ref 4.8–5.6)

## 2023-04-12 LAB — HCV INTERPRETATION

## 2023-06-09 ENCOUNTER — Ambulatory Visit: Payer: Medicaid Other | Admitting: Urology

## 2023-06-09 ENCOUNTER — Encounter: Payer: Self-pay | Admitting: Urology

## 2023-06-09 VITALS — BP 127/87 | HR 67

## 2023-06-09 DIAGNOSIS — R350 Frequency of micturition: Secondary | ICD-10-CM | POA: Diagnosis not present

## 2023-06-09 DIAGNOSIS — N50811 Right testicular pain: Secondary | ICD-10-CM | POA: Diagnosis not present

## 2023-06-09 DIAGNOSIS — K4090 Unilateral inguinal hernia, without obstruction or gangrene, not specified as recurrent: Secondary | ICD-10-CM

## 2023-06-09 LAB — URINALYSIS, ROUTINE W REFLEX MICROSCOPIC
Bilirubin, UA: NEGATIVE
Glucose, UA: NEGATIVE
Leukocytes,UA: NEGATIVE
Nitrite, UA: NEGATIVE
RBC, UA: NEGATIVE
Specific Gravity, UA: 1.03 (ref 1.005–1.030)
Urobilinogen, Ur: 1 mg/dL (ref 0.2–1.0)
pH, UA: 6 (ref 5.0–7.5)

## 2023-06-09 MED ORDER — TAMSULOSIN HCL 0.4 MG PO CAPS: 0.4000 mg | ORAL_CAPSULE | Freq: Every day | ORAL | 11 refills | Status: AC

## 2023-06-09 NOTE — Patient Instructions (Signed)

## 2023-06-09 NOTE — Progress Notes (Unsigned)
06/09/2023 9:01 AM   Thomas Hunter [birth year] [id number]  Referring provider: No referring provider defined for this encounter.  No chief complaint on file.   HPI: Mr Thomas Hunter is a 37yo here for evaluation of right testis mass and urinary frequency. IPSS 12 QOL 3. He has increased urinary frequency and urgency during the day. He has nocturia 1-2x. Urine stream strong. No straining to urinate. The urinary urgency is worse when he is at work and working outside. The right testis pain is dull, intermittent mild to moderate and nonradiitng. The pain has been worse over the past year. Lifting makes the pain worse.    PMH: Past Medical History:  Diagnosis Date   Anxiety    Depression    ETOH abuse    GERD (gastroesophageal reflux disease)    Gonorrhea 2016    Surgical History: No past surgical history on file.  Home Medications:  Allergies as of 06/09/2023   No Known Allergies      Medication List        Accurate as of June 09, 2023  9:01 AM. If you have any questions, ask your nurse or doctor.          HYDROcodone-acetaminophen 5-325 MG tablet Commonly known as: NORCO/VICODIN Take 1 tablet by mouth 3 (three) times daily as needed for moderate pain.   hydrOXYzine 10 MG tablet Commonly known as: ATARAX Take 1 tablet (10 mg total) by mouth 3 (three) times daily as needed.   ibuprofen 800 MG tablet Commonly known as: ADVIL Take 1 tablet (800 mg total) by mouth every 8 (eight) hours as needed.   lamoTRIgine 25 MG tablet Commonly known as: LaMICtal Take 2 tablets (50 mg total) by mouth daily.   lidocaine 5 % Commonly known as: Lidoderm Place 1 patch onto the skin daily. Remove & Discard patch within 12 hours or as directed by MD   mirtazapine 15 MG tablet Commonly known as: Remeron Take 1 tablet (15 mg total) by mouth at bedtime.   nicotine 21 mg/24hr patch Commonly known as: NICODERM CQ - dosed in mg/24 hours Place 1 patch (21 mg total) onto the skin  daily.   penicillin v potassium 500 MG tablet Commonly known as: VEETID Take 1 tablet (500 mg total) by mouth 4 (four) times daily.   thiamine 100 MG tablet Commonly known as: VITAMIN B1 Take 1 tablet (100 mg total) by mouth daily.   traMADol 50 MG tablet Commonly known as: ULTRAM Take 1 tablet (50 mg total) by mouth every 6 (six) hours as needed for severe pain.   vitamin B-12 50 MCG tablet Commonly known as: CYANOCOBALAMIN Take 50 mcg by mouth daily.        Allergies: No Known Allergies  Family History: Family History  Problem Relation Age of Onset   Hypertension Other    Diabetes Other     Social History:  reports that he has been smoking cigarettes. He has been smoking an average of .75 packs per day. He has never used smokeless tobacco. He reports current alcohol use. He reports current drug use. Drug: Marijuana.  ROS: All other review of systems were reviewed and are negative except what is noted above in HPI  Physical Exam: BP 127/87   Pulse 67   Constitutional:  Alert and oriented, No acute distress. HEENT: [place] AT, moist mucus membranes.  Trachea midline, no masses. Cardiovascular: No clubbing, cyanosis, or edema. Respiratory: Normal respiratory effort, no increased work of breathing. GI:  Abdomen is soft, nontender, nondistended, no abdominal masses. Right inguinal hernia. GU: No CVA tenderness. Circumcised phallus. No masses/lesions on penis, testis, scrotum.  Lymph: No cervical or inguinal lymphadenopathy. Skin: No rashes, bruises or suspicious lesions. Neurologic: Grossly intact, no focal deficits, moving all 4 extremities. Psychiatric: Normal mood and affect.  Laboratory Data: Lab Results  Component Value Date   WBC 7.8 04/11/2023   HGB 17.0 04/11/2023   HCT 48.1 04/11/2023   MCV 96 04/11/2023   PLT 280 04/11/2023    Lab Results  Component Value Date   CREATININE 0.73 (L) 04/11/2023    No results found for: "PSA"  No results found for:  "TESTOSTERONE"  Lab Results  Component Value Date   HGBA1C 5.5 04/11/2023    Urinalysis    Component Value Date/Time   COLORURINE AMBER (A) 07/31/2015 1330   APPEARANCEUR CLEAR 07/31/2015 1330   LABSPEC 1.031 (H) 07/31/2015 1330   PHURINE 7.0 07/31/2015 1330   GLUCOSEU NEGATIVE 07/31/2015 1330   HGBUR NEGATIVE 07/31/2015 1330   BILIRUBINUR NEGATIVE 07/31/2015 1330   KETONESUR NEGATIVE 07/31/2015 1330   PROTEINUR NEGATIVE 07/31/2015 1330   UROBILINOGEN 0.2 07/31/2015 1330   NITRITE NEGATIVE 07/31/2015 1330   LEUKOCYTESUR MODERATE (A) 07/31/2015 1330    Lab Results  Component Value Date   BACTERIA RARE 07/31/2015    Pertinent Imaging: *** No results found for this or any previous visit.  No results found for this or any previous visit.  No results found for this or any previous visit.  No results found for this or any previous visit.  No results found for this or any previous visit.  No valid procedures specified. No results found for this or any previous visit.  No results found for this or any previous visit.   Assessment & Plan:    1. Pain in right testicle -Possible related to right inguinal hernia. I will refer to Dr. Lovell Sheehan - Urinalysis, Routine w reflex microscopic  2. Urinary frequency/urgency -We will trial flomax 0.4mg    No follow-ups on file.  Thomas Aye, MD  [facility] [place]

## 2023-07-04 ENCOUNTER — Encounter: Payer: Self-pay | Admitting: General Surgery

## 2023-07-04 ENCOUNTER — Ambulatory Visit: Payer: Medicaid Other | Admitting: General Surgery

## 2023-07-04 VITALS — BP 129/86 | HR 102 | Temp 98.0°F | Resp 16 | Ht 69.0 in | Wt 148.0 lb

## 2023-07-04 DIAGNOSIS — K4020 Bilateral inguinal hernia, without obstruction or gangrene, not specified as recurrent: Secondary | ICD-10-CM | POA: Diagnosis not present

## 2023-07-04 NOTE — Progress Notes (Signed)
Thomas Hunter; [id number]; [birth year]   HPI Patient is a 37 year old white male who was referred to my care by Dr. Ronne Binning of urology for evaluation and treatment of a right inguinal hernia.  Patient was seen by Dr. Ronne Binning as he was having some referred right testicular pain.  On examination, it was felt that he had a right inguinal hernia.  Patient states he has had intermittent swelling in that region for many years, but over the past few weeks, has had increasing pain in the right testicle.  He denies any nausea or vomiting.  It is made worse with straining.  It is very intermittent in nature. Past Medical History:  Diagnosis Date   Anxiety    Depression    ETOH abuse    GERD (gastroesophageal reflux disease)    Gonorrhea 2016    History reviewed. No pertinent surgical history.  Family History  Problem Relation Age of Onset   Hypertension Other    Diabetes Other     Current Outpatient Medications on File Prior to Visit  Medication Sig Dispense Refill   vitamin B-12 (CYANOCOBALAMIN) 50 MCG tablet Take 50 mcg by mouth daily.     tamsulosin (FLOMAX) 0.4 MG CAPS capsule Take 1 capsule (0.4 mg total) by mouth daily after supper. (Patient not taking: Reported on 07/04/2023) 30 capsule 11   No current facility-administered medications on file prior to visit.    No Known Allergies  Social History   Substance and Sexual Activity  Alcohol Use Yes   Comment: 1 pint per day     Social History   Tobacco Use  Smoking Status Every Day   Current packs/day: 0.75   Types: Cigarettes  Smokeless Tobacco Never    Review of Systems  Constitutional:  Positive for chills and malaise/fatigue.  HENT:  Positive for sinus pain.   Eyes: Negative.   Respiratory:  Positive for cough and shortness of breath.   Cardiovascular: Negative.   Gastrointestinal:  Positive for abdominal pain, heartburn and nausea.  Genitourinary: Negative.   Musculoskeletal:  Positive for back pain, joint pain  and neck pain.  Skin: Negative.   Neurological:  Positive for tremors.  Endo/Heme/Allergies: Negative.   Psychiatric/Behavioral: Negative.      Objective   Vitals:   07/04/23 1457  BP: 129/86  Pulse: (!) 102  Resp: 16  Temp: 98 F (36.7 C)  SpO2: 98%    Physical Exam Vitals reviewed.  Constitutional:      Appearance: Normal appearance. He is normal weight. He is not ill-appearing.  HENT:     Head: Normocephalic and atraumatic.  Cardiovascular:     Rate and Rhythm: Normal rate.     Heart sounds: Normal heart sounds. No murmur heard.    No friction rub. No gallop.  Pulmonary:     Effort: Pulmonary effort is normal. No respiratory distress.     Breath sounds: Normal breath sounds. No stridor. No wheezing, rhonchi or rales.  Abdominal:     General: Abdomen is flat. Bowel sounds are normal. There is no distension.     Palpations: Abdomen is soft. There is no mass.     Tenderness: There is no guarding or rebound.     Hernia: A hernia is present.     Comments: Patient does have small bilateral inguinal hernias, right greater than left.  They were most pronounced when straining.  Genitourinary:    Testes: Normal.  Skin:    General: Skin is warm and dry.  Neurological:     Mental Status: He is alert and oriented to person, place, and time.    Urology notes reviewed Assessment  Bilateral inguinal hernias.  It is variable as to how symptomatic he is. Plan  I told the patient should he become more symptomatic, I would proceed with a robotic assisted laparoscopic bilateral inguinal herniorrhaphy with mesh.  The risks and benefits of the procedure including bleeding, mesh use, and recurrence of the hernias were fully explained to the patient, gave informed consent.  He states he is busy at work right now but may consider having the surgery in the future.  He will contact me should he desire to proceed with surgical intervention.  Literature was given.

## 2023-07-14 ENCOUNTER — Encounter (INDEPENDENT_AMBULATORY_CARE_PROVIDER_SITE_OTHER): Payer: Self-pay | Admitting: Primary Care

## 2023-07-14 ENCOUNTER — Ambulatory Visit (INDEPENDENT_AMBULATORY_CARE_PROVIDER_SITE_OTHER): Payer: Medicaid Other | Admitting: Primary Care

## 2023-07-14 VITALS — BP 124/78 | HR 67 | Resp 16 | Wt 151.0 lb

## 2023-07-14 DIAGNOSIS — F1010 Alcohol abuse, uncomplicated: Secondary | ICD-10-CM | POA: Diagnosis not present

## 2023-07-14 DIAGNOSIS — R5383 Other fatigue: Secondary | ICD-10-CM

## 2023-07-14 NOTE — Progress Notes (Unsigned)
me

## 2023-07-14 NOTE — Patient Instructions (Signed)
Drinking binge drinks consistently 2 16 oz IPA daily than drunk hard liquor passed out house torn up doesn't recall anything.  No hard liquor. Next 2 weeks drink 2 12 oz of IPA for 2 weeks and return for f/u. (AlcoholAlcohol Withdrawal Syndrome  Alcohol withdrawal syndrome is a group of symptoms that can happen when a person who drinks heavily and regularly stops drinking or drinks less. This condition may be mild or severe. It can also be life-threatening. What are the causes? Alcohol withdrawal syndrome happens when a person who has been drinking a lot of alcohol for a long time stops drinking. What increases the risk? You are more likely to get alcohol withdrawal syndrome if: You drink more alcohol than is recommended. The limit is: 0-1 drink a day for women who are not pregnant. 0-2 drinks a day for men. You have used alcohol for a long time. You have had alcohol withdrawal syndrome in the past. You have had a seizure in the past when you stopped drinking. You are an older person. You use drugs. You have long-term (chronic) disease, such as heart or lung problems. You have depression. You are not eating well. What are the signs or symptoms? Symptoms of this condition include: Shaking. Sweating. Headache. Alcohol cravings. Feeling fearful, upset, grouchy, or depressed. Trouble sleeping or nightmares. Not being hungry (loss of appetite). Moderate symptoms of this condition include: Big changes in mood (mood swings). Trouble thinking clearly. Being bothered by light and sounds. Fast or uneven heartbeats (palpitations). Vomiting. Some symptoms are serious and must be treated right away. These symptoms are called delirium tremens, or DTs. Get help right away if you have symptoms of DTs. Symptoms include: High blood pressure. Fast heartbeat. Trouble breathing. Seizures. Seeing, hearing, feeling, smelling, or tasting things that are not there (hallucinations). How is this  treated? Treatment may involve: Checking your blood pressure, pulse, and breathing. IV fluids to keep you hydrated. Medicines to treat your symptoms. Medicine to reduce anxiety. Medicine to prevent or control seizures. Multivitamins and B vitamins. Having a doctor check on you daily. If you need help to stop drinking, your doctor may recommend: Medicines. Counseling. Support groups. Follow these instructions at home:  Take over-the-counter and prescription medicines only as told by your doctor. Do not drink alcohol. Do not drive until your doctor says that it is safe. Have someone stay with you or be available in case you need help. This should be someone you trust. This person can help you with your symptoms and can also help you to not drink. Drink enough fluid to keep your pee (urine) pale yellow. Think about joining a support group or a treatment program to help you stop drinking. Contact a doctor if: Your symptoms get worse. You cannot eat or drink without vomiting. You cannot stop drinking alcohol. Get help right away if: You have fast or uneven heartbeats. You have chest pain. You have trouble breathing. You are told you had a seizure. You see, hear, feel, smell, or taste something that is not there. You get very confused. These symptoms may be an emergency. Get help right away. Call 911. Do not wait to see if the symptoms will go away. Do not drive yourself to the hospital. Summary Alcohol withdrawal syndrome is a group of symptoms that can happen when a person who drinks heavily and regularly stops drinking or drinks less. Delirium tremens (DTs) is a group of life-threatening symptoms. You should get help right away if you have  these symptoms. Think about joining an alcohol support group or a treatment program. This information is not intended to replace advice given to you by your health care provider. Make sure you discuss any questions you have with your health care  provider. Document Revised: 02/09/2022 Document Reviewed: 02/09/2022 Elsevier Patient Education  2024 [employer].  withdrawals.)

## 2023-07-28 ENCOUNTER — Ambulatory Visit (INDEPENDENT_AMBULATORY_CARE_PROVIDER_SITE_OTHER): Payer: Medicaid Other | Admitting: Primary Care

## 2023-08-02 ENCOUNTER — Telehealth (INDEPENDENT_AMBULATORY_CARE_PROVIDER_SITE_OTHER): Payer: Self-pay | Admitting: Licensed Clinical Social Worker

## 2023-08-02 NOTE — Telephone Encounter (Signed)
LCSWA called patient today to introduce herself and to assess patients' mental health needs. Patient did not answer the phone. LCSWA was NOT able to leave a brief message with the patient asking them to return the call, Voicemail not set up. Patient was referred by PCP for Alcohol Abuse (Primary)

## 2023-08-16 ENCOUNTER — Ambulatory Visit: Payer: Medicaid Other | Admitting: Urology

## 2023-08-21 ENCOUNTER — Telehealth (INDEPENDENT_AMBULATORY_CARE_PROVIDER_SITE_OTHER): Payer: Self-pay

## 2023-08-21 ENCOUNTER — Ambulatory Visit (INDEPENDENT_AMBULATORY_CARE_PROVIDER_SITE_OTHER): Payer: Medicaid Other | Admitting: Primary Care

## 2023-08-21 NOTE — Telephone Encounter (Signed)
Contacted pt to reschedule appt for today pt didn't answer lvm

## 2023-09-21 DIAGNOSIS — K047 Periapical abscess without sinus: Secondary | ICD-10-CM | POA: Diagnosis not present

## 2023-09-21 DIAGNOSIS — K0889 Other specified disorders of teeth and supporting structures: Secondary | ICD-10-CM | POA: Diagnosis not present

## 2023-11-29 DIAGNOSIS — K0889 Other specified disorders of teeth and supporting structures: Secondary | ICD-10-CM | POA: Diagnosis not present

## 2023-11-29 DIAGNOSIS — K047 Periapical abscess without sinus: Secondary | ICD-10-CM | POA: Diagnosis not present

## 2024-12-20 ENCOUNTER — Ambulatory Visit: Payer: Self-pay

## 2024-12-20 NOTE — Telephone Encounter (Signed)
 FYI Only or Action Required?: FYI only for provider: advised patient go to urgent care.  Patient was last seen in primary care on 07/14/2023 by Celestia Rosaline SQUIBB, NP.  Called Nurse Triage reporting Back Pain.  Symptoms began several weeks ago.  Interventions attempted: Nothing.  Symptoms are: gradually worsening.  Triage Disposition: See HCP Within 4 Hours (Or PCP Triage)  Patient/caregiver understands and will follow disposition?: Yes               Copied from CRM #[id number]. Topic: Clinical - Red Word Triage >> Dec 20, 2024 11:04 AM Tiffini S wrote: [facility] that prompted transfer to Nurse Triage: lower back pain- especially when standing up and/ or sleeping. Pain level is a 8 of out 10- scheduled appointment with pcp on 12/26/24 Reason for Disposition  [1] Pain radiates into the thigh or further down the leg AND [2] both legs  Answer Assessment - Initial Assessment Questions Patient scheduled by PAS prior to nurse triage. Patient is agreeable to go to urgent care and be seen today but would like to keep his appt with PCP next week.   1. ONSET: When did the pain begin? (e.g., minutes, hours, days)     Few weeks.  2. LOCATION: Where does it hurt? (upper, mid or lower back)     Lower.  3. SEVERITY: How bad is the pain?  (e.g., Scale 1-10; mild, moderate, or severe)     4-5/10. Pain increases to 8/10 when standing or lying down for sleep.  4. PATTERN: Is the pain constant? (e.g., yes, no; constant, intermittent)      Constant.  5. RADIATION: Does the pain shoot into your legs or somewhere else?     Radiates down both legs and feels like weakness when climbing stairs; right flank/hip comes and goes, occurring x 1 week  6. CAUSE:  What do you think is causing the back pain?      Unsure. Hx of back pain, pinched nerve but in diff spot  7. BACK OVERUSE:  Any recent lifting of heavy objects, strenuous work or exercise?     Gradually worsening since having  a job and had to go under someone's home.  8. MEDICINES: What have you taken so far for the pain? (e.g., nothing, acetaminophen , NSAIDS)     Nothing.  9. NEUROLOGIC SYMPTOMS: Do you have any weakness, numbness, or problems with bowel/bladder control?     No problems with bowel or bladder control or numbness but states his legs both feel weak when going up or down stairs.  10. OTHER SYMPTOMS: Do you have any other symptoms? (e.g., fever, abdomen pain, burning with urination, blood in urine)       Felt a burning twisting pain perineum suddenly when jumping and landing on Monday. Urinary frequency. States he is an alcoholic, drinks daily 2-3 beers and trying to wean down to 1 beer daily. No fever, urinary retention  Protocols used: Back Pain-A-AH

## 2024-12-26 ENCOUNTER — Encounter (INDEPENDENT_AMBULATORY_CARE_PROVIDER_SITE_OTHER): Payer: Self-pay | Admitting: Primary Care

## 2024-12-26 ENCOUNTER — Ambulatory Visit (INDEPENDENT_AMBULATORY_CARE_PROVIDER_SITE_OTHER): Payer: Self-pay | Admitting: Primary Care

## 2024-12-26 ENCOUNTER — Telehealth: Payer: Self-pay

## 2024-12-26 VITALS — BP 126/83 | HR 79 | Resp 16 | Ht 69.0 in | Wt 156.2 lb

## 2024-12-26 DIAGNOSIS — M545 Low back pain, unspecified: Secondary | ICD-10-CM | POA: Diagnosis not present

## 2024-12-26 NOTE — Patient Instructions (Signed)
 Key Principles for Lifting & Moving Get Close: Position your body as close as possible to the object you're moving. Stable Base: Spread your feet shoulder-width apart for balance. Bend Knees, Not Back: Squat down by bending your hips and knees, keeping your back straight. Lift with Legs: Engage your powerful thigh and gluteal muscles, not your back. Keep it Close: Hold the object near your chest or core. Turn, Don't Twist: Move your feet to turn your whole body; avoid twisting at the waist. Core Engagement: Tighten your abdominal muscles to support your spine. Smooth Movement: Use controlled, deliberate motions; avoid jerking.  Proper Sitting & Standing Neutral Spine: Keep your back in a natural, neutral position (not rounded or arched). Feet Flat: When sitting, keep feet flat on the floor with knees at a 90-degree angle. Avoid Leaning: Don't lean heavily on one leg or chair arm when standing. Pushing & Pulling Face the Load: Turn to face what you're pushing or pulling. Use Legs: Shift your weight by bending your knees and moving your feet. Avoid Twisting: Never twist your back while pushing or pulling. Proper Body Mechanics - What You Need to Know - [employer] Dec 17, 2024 -- What proper body mechanics guidelines do I need to follow? * Bend at your hips and knees instead of your waist.

## 2024-12-26 NOTE — Telephone Encounter (Signed)
 Copied from CRM [id number]. Topic: Clinical - Medication Question >> Dec 26, 2024  4:19 PM Selinda RAMAN wrote: Reason for CRM: The patient called in stating he just saw his provider but he forgot to ask if she would call him in a prescription for the 24mg  nicotine  patches. He uses   [id number] [place], [place] - [address] AT [address]  Phone: [phone] Fax: [phone]   Please assist patient further

## 2024-12-26 NOTE — Progress Notes (Signed)
 " [facility]  Thomas Hunter, is a 39 y.o. male  RDW:[id number]  FMW:[id number]  DOB - Nov 07, 1986  Chief Complaint  Patient presents with   Back Pain    Lower   Started 2 weeks ago  No injuries  Been taking ibuprofen , using heating pad    Alcohol Problem       Subjective:   Thomas Hunter is a 39 y.o. male here today for an acute visit.  Back Pain This is a new problem. The current episode started 1 to 4 weeks ago. The problem occurs 2 to 4 times per day. The problem has been waxing and waning since onset. The pain is present in the lumbar spine and sacro-iliac. The quality of the pain is described as aching. The pain does not radiate. The pain is at a severity of 7/10. The pain is moderate. The pain is Worse during the night. The symptoms are aggravated by standing and lying down. Stiffness is present All day. Associated symptoms include weakness. Risk factors include poor posture and history of cancer. He has tried NSAIDs, heat and bed rest for the symptoms. The treatment provided mild relief.  Alcohol Problem The patient's primary symptoms include weakness.   Patient states he is doing better with drinking.  Drinking IPAs and now drinking iced house almost every day maybe 1 to 2 -  24 ounce beers no liquor  Comprehensive ROS Pertinent positive and negative noted in HPI   Allergies[1]  Past Medical History:  Diagnosis Date   Anxiety    Depression    ETOH abuse    GERD (gastroesophageal reflux disease)    Gonorrhea 2016    Medications Ordered Prior to Encounter[2] Health Maintenance  Topic Date Due   Pneumococcal Vaccine (1 of 2 - PCV) Never done   Hepatitis B Vaccine (1 of 3 - 19+ 3-dose series) Never done   DTaP/Tdap/Td vaccine (2 - Td or Tdap) 06/08/2024   Flu Shot  Never done   COVID-19 Vaccine (1 - 2025-26 season) Never done   HPV Vaccine (No Doses Required) Completed   Hepatitis C Screening  Completed   HIV Screening  Completed    Meningitis B Vaccine  Aged Out    Objective:   Vitals:   12/26/24 1456  BP: 126/83  Pulse: 79  Resp: 16  SpO2: 97%  Weight: 156 lb 3.2 oz (70.9 kg)  Height: 5' 9 (1.753 m)   BP Readings from Last 3 Encounters:  12/26/24 126/83  07/14/23 124/78  07/04/23 129/86      Physical Exam Vitals reviewed.  Constitutional:      Appearance: Normal appearance.  HENT:     Head: Normocephalic.     Right Ear: Tympanic membrane and external ear normal.     Left Ear: Tympanic membrane and external ear normal.     Nose: Nose normal.  Eyes:     Extraocular Movements: Extraocular movements intact.     Pupils: Pupils are equal, round, and reactive to light.  Cardiovascular:     Rate and Rhythm: Normal rate and regular rhythm.  Pulmonary:     Effort: Pulmonary effort is normal.     Breath sounds: Normal breath sounds.  Abdominal:     General: Bowel sounds are normal.     Palpations: Abdomen is soft.  Musculoskeletal:        General: Tenderness present. Normal range of motion.  Skin:    General: Skin is warm and dry.  Neurological:  Mental Status: He is alert and oriented to person, place, and time.  Psychiatric:        Mood and Affect: Mood normal.        Behavior: Behavior normal.        Thought Content: Thought content normal.        Judgment: Judgment normal.     Assessment & Plan  Thomas Hunter was seen today for back pain and alcohol problem.  Diagnoses and all orders for this visit:  Acute bilateral low back pain without sciatica Not using proper body mechanics  -     AMB referral to orthopedics     Patient have been counseled extensively about nutrition and exercise. Other issues discussed during this visit include: low cholesterol diet, weight control and daily exercise, foot care, annual eye examinations at Ophthalmology, importance of adherence with medications and regular follow-up. We also discussed long term complications of uncontrolled diabetes and hypertension.    Return for annual physical.  The patient was given clear instructions to go to ER or return to medical center if symptoms don't improve, worsen or new problems develop. The patient verbalized understanding. The patient was told to call to get lab results if they haven't heard anything in the next week.   This note has been created with Education officer, environmental. Any transcriptional errors are unintentional.   Rosaline SHAUNNA Bohr, NP 12/26/2024, 3:28 PM     [1] No Known Allergies [2]  Current Outpatient Medications on File Prior to Visit  Medication Sig Dispense Refill   tamsulosin  (FLOMAX ) 0.4 MG CAPS capsule Take 1 capsule (0.4 mg total) by mouth daily after supper. (Patient not taking: Reported on 07/04/2023) 30 capsule 11   vitamin B-12 (CYANOCOBALAMIN) 50 MCG tablet Take 50 mcg by mouth daily.     No current facility-administered medications on file prior to visit.   "

## 2024-12-26 NOTE — Telephone Encounter (Signed)
 Will forward to provider

## 2024-12-30 ENCOUNTER — Encounter: Payer: Self-pay | Admitting: Physical Medicine and Rehabilitation

## 2024-12-30 ENCOUNTER — Other Ambulatory Visit: Payer: Self-pay

## 2024-12-30 ENCOUNTER — Other Ambulatory Visit (INDEPENDENT_AMBULATORY_CARE_PROVIDER_SITE_OTHER): Payer: Self-pay | Admitting: Primary Care

## 2024-12-30 ENCOUNTER — Ambulatory Visit: Admitting: Physical Medicine and Rehabilitation

## 2024-12-30 DIAGNOSIS — M5441 Lumbago with sciatica, right side: Secondary | ICD-10-CM | POA: Diagnosis not present

## 2024-12-30 DIAGNOSIS — Z716 Tobacco abuse counseling: Secondary | ICD-10-CM

## 2024-12-30 DIAGNOSIS — M5416 Radiculopathy, lumbar region: Secondary | ICD-10-CM

## 2024-12-30 DIAGNOSIS — G8929 Other chronic pain: Secondary | ICD-10-CM

## 2024-12-30 MED ORDER — NICOTINE 21 MG/24HR TD PT24: 21.0000 mg | MEDICATED_PATCH | Freq: Every day | TRANSDERMAL | 0 refills | Status: AC

## 2024-12-30 MED ORDER — NICOTINE 14 MG/24HR TD PT24: 14.0000 mg | MEDICATED_PATCH | Freq: Every day | TRANSDERMAL | 0 refills | Status: AC

## 2024-12-30 MED ORDER — NICOTINE 7 MG/24HR TD PT24: 7.0000 mg | MEDICATED_PATCH | Freq: Every day | TRANSDERMAL | 0 refills | Status: AC

## 2024-12-30 NOTE — Progress Notes (Signed)
 Pain Scale   Average Pain 5 Patient advising he has lower back pain that increases when standing or walking.        +Driver, -BT, -Dye Allergies.

## 2024-12-30 NOTE — Progress Notes (Signed)
 "  Thomas Hunter - 39 y.o. male MRN [id number]  Date of birth: Apr 01, 1986  Office Visit Note: Visit Date: 12/30/2024 PCP: Celestia Rosaline SQUIBB, NP Referred by: Celestia Rosaline SQUIBB, NP  Subjective: Chief Complaint  Patient presents with   Lower Back - Pain   HPI: Thomas Hunter is a 39 y.o. male who comes in today per the request of Rosaline Celestia, NP for evaluation of chronic, worsening and severe bilateral lower back pain radiating to right buttock and down lateral thigh to knee. Pain ongoing for several years, states he injured his back jumping on trampoline as a child and reports ongoing intermittent issues with pain. His pain worsens with standing and laying down to sleep. He describes pain as tight and burning sensation, currently rates as 7 out of 10. Some relief of pain with home exercise regimen, rest and use of medications. Lumbar radiographs from 2019 show normal segmentation and alignment, well preserved disc spacing, no spondylolisthesis. No prior lumbar MRI imaging. No history of lumbar surgery/injections. He is currently working in hospital doctor. Patient denies focal weakness, numbness and tingling. No recent trauma or falls.   Patients course is complicated by alcohol dependence, bipolar disorder, substance induced mood disorder and self mutilation.      Review of Systems  Musculoskeletal:  Positive for back pain.  Neurological:  Negative for tingling, sensory change, focal weakness and weakness.  All other systems reviewed and are negative.  Otherwise per HPI.  Assessment & Plan: Visit Diagnoses:    ICD-10-CM   1. Chronic bilateral low back pain with right-sided sciatica  G89.29 MR LUMBAR SPINE WO CONTRAST   M54.41     2. Lumbar radiculopathy  M54.16 XR Lumbar Spine 2-3 Views    MR LUMBAR SPINE WO CONTRAST       Plan: Findings:  Chronic, worsening and severe bilateral lower back pain radiating to right buttock and down lateral thigh to knee.  Patient  continues to have severe pain despite good conservative therapy such as home exercise regimen, rest and use of medications.  Patient's clinical presentation and exam are consistent with lumbar radiculopathy, more of an L5 dermatomal distribution.  I obtained lumbar radiographs in the office today that look fairly normal, normal alignment and segmentation, no spondylolisthesis. Good joint spacing to hips bilaterally.  We discussed treatment plan in detail today.  Given the chronicity and worsening of his symptoms I did place order for lumbar MRI imaging to rule out any structural abnormalities.  Depending on results of MRI imaging we discussed the possibility of performing lumbar epidural steroid injections.  Could also look at short course of formal physical therapy with a focus on core strengthening and establishing home exercise regimen.  His exam today is nonfocal, good strength noted to bilateral lower extremities.  No pain with internal/external rotation of hips.    Meds & Orders: No orders of the defined types were placed in this encounter.   Orders Placed This Encounter  Procedures   XR Lumbar Spine 2-3 Views   MR LUMBAR SPINE WO CONTRAST    Follow-up: Return for Lumbar MRI review.   Procedures: No procedures performed      Clinical History: No specialty comments available.   He reports that he has been smoking cigarettes. He has never used smokeless tobacco. No results for input(s): HGBA1C, LABURIC in the last 8760 hours.  Objective:  VS:  HT:    WT:   BMI:     BP:  HR: bpm  TEMP: ( )  RESP:  Physical Exam Vitals and nursing note reviewed.  HENT:     Head: Normocephalic and atraumatic.     Right Ear: External ear normal.     Left Ear: External ear normal.     Nose: Nose normal.     Mouth/Throat:     Mouth: Mucous membranes are moist.  Eyes:     Extraocular Movements: Extraocular movements intact.  Cardiovascular:     Rate and Rhythm: Normal rate.     Pulses:  Normal pulses.  Pulmonary:     Effort: Pulmonary effort is normal.  Abdominal:     General: Abdomen is flat. There is no distension.  Musculoskeletal:        General: Tenderness present.     Cervical back: Normal range of motion.     Comments: Patient rises from seated position to standing without difficulty. Good lumbar range of motion. No pain noted with facet loading. 5/5 strength noted with bilateral hip flexion, knee flexion/extension, ankle dorsiflexion/plantarflexion and EHL. No clonus noted bilaterally. No pain upon palpation of greater trochanters. No pain with internal/external rotation of bilateral hips. Sensation intact bilaterally. Dysesthesias noted to right L5 dermatome. Negative slump test bilaterally. Ambulates without aid, gait steady.     Skin:    General: Skin is warm and dry.     Capillary Refill: Capillary refill takes less than 2 seconds.  Neurological:     General: No focal deficit present.     Mental Status: He is alert and oriented to person, place, and time.  Psychiatric:        Mood and Affect: Mood normal.        Behavior: Behavior normal.     Ortho Exam  Imaging: No results found.   Past Medical/Family/Surgical/Social History: Medications & Allergies reviewed per EMR, new medications updated. 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 No pertinent surgical history. Social History   Occupational History   Occupation: Adult Nurse: [employer]  Tobacco Use   Smoking status: Every Day    Current packs/day: 0.75    Types: Cigarettes   Smokeless tobacco: Never  Vaping Use   Vaping status: Former   Quit date: 04/22/2018  Substance and Sexual Activity   Alcohol use: Yes    Comment: 1 pint per day    Drug use: Yes    Types: Marijuana    Comment: crack   Sexual activity: Not Currently    Partners: Female    "

## 2024-12-30 NOTE — Progress Notes (Signed)
 Core Outcome Measures Index (COMI) Back Score  Average Pain 7  COMI Score 70%

## 2025-01-01 ENCOUNTER — Encounter: Payer: Self-pay | Admitting: Physical Medicine and Rehabilitation

## 2025-01-05 ENCOUNTER — Other Ambulatory Visit

## 2025-01-26 ENCOUNTER — Other Ambulatory Visit
